# Patient Record
Sex: Male | Born: 1940
Health system: Southern US, Community
[De-identification: ages and names within clinical notes are randomized; demographics above are authoritative.]

## PROBLEM LIST (undated history)

## (undated) DIAGNOSIS — I7121 Aneurysm of the ascending aorta, without rupture: Secondary | ICD-10-CM

## (undated) DIAGNOSIS — Z8249 Family history of ischemic heart disease and other diseases of the circulatory system: Secondary | ICD-10-CM

## (undated) DIAGNOSIS — I2510 Atherosclerotic heart disease of native coronary artery without angina pectoris: Secondary | ICD-10-CM

## (undated) DIAGNOSIS — C801 Malignant (primary) neoplasm, unspecified: Secondary | ICD-10-CM

## (undated) DIAGNOSIS — E785 Hyperlipidemia, unspecified: Secondary | ICD-10-CM

## (undated) DIAGNOSIS — E041 Nontoxic single thyroid nodule: Secondary | ICD-10-CM

## (undated) DIAGNOSIS — I712 Thoracic aortic aneurysm, without rupture: Secondary | ICD-10-CM

## (undated) DIAGNOSIS — I10 Essential (primary) hypertension: Secondary | ICD-10-CM

## (undated) HISTORY — DX: Aneurysm of the ascending aorta, without rupture: I71.21

## (undated) HISTORY — PX: URINARY SPHINCTER IMPLANT: SHX2624

## (undated) HISTORY — DX: Malignant (primary) neoplasm, unspecified: C80.1

## (undated) HISTORY — DX: Hyperlipidemia, unspecified: E78.5

## (undated) HISTORY — DX: Thoracic aortic aneurysm, without rupture: I71.2

## (undated) HISTORY — DX: Essential (primary) hypertension: I10

## (undated) HISTORY — DX: Nontoxic single thyroid nodule: E04.1

## (undated) HISTORY — PX: HAND SURGERY: SHX662

## (undated) HISTORY — PX: ROTATOR CUFF REPAIR: SHX139

## (undated) HISTORY — PX: CARDIAC CATHETERIZATION: SHX172

## (undated) HISTORY — PX: CARPAL TUNNEL RELEASE: SHX101

## (undated) HISTORY — DX: Family history of ischemic heart disease and other diseases of the circulatory system: Z82.49

---
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
## 1980-12-03 HISTORY — PX: INGUINAL HERNIA REPAIR: SUR1180

## 1993-12-03 DIAGNOSIS — C801 Malignant (primary) neoplasm, unspecified: Secondary | ICD-10-CM

## 1993-12-03 HISTORY — DX: Malignant (primary) neoplasm, unspecified: C80.1

## 2015-02-09 DIAGNOSIS — E780 Pure hypercholesterolemia: Secondary | ICD-10-CM | POA: Diagnosis not present

## 2015-02-09 DIAGNOSIS — C61 Malignant neoplasm of prostate: Secondary | ICD-10-CM | POA: Diagnosis not present

## 2015-03-01 DIAGNOSIS — I6523 Occlusion and stenosis of bilateral carotid arteries: Secondary | ICD-10-CM | POA: Diagnosis not present

## 2015-03-01 DIAGNOSIS — E780 Pure hypercholesterolemia: Secondary | ICD-10-CM | POA: Diagnosis not present

## 2015-03-01 DIAGNOSIS — I119 Hypertensive heart disease without heart failure: Secondary | ICD-10-CM | POA: Diagnosis not present

## 2015-03-02 DIAGNOSIS — M65351 Trigger finger, right little finger: Secondary | ICD-10-CM | POA: Diagnosis not present

## 2015-03-02 DIAGNOSIS — M65341 Trigger finger, right ring finger: Secondary | ICD-10-CM | POA: Diagnosis not present

## 2015-03-02 DIAGNOSIS — M65841 Other synovitis and tenosynovitis, right hand: Secondary | ICD-10-CM | POA: Diagnosis not present

## 2015-03-14 DIAGNOSIS — R1031 Right lower quadrant pain: Secondary | ICD-10-CM | POA: Diagnosis not present

## 2015-03-14 DIAGNOSIS — N529 Male erectile dysfunction, unspecified: Secondary | ICD-10-CM | POA: Diagnosis not present

## 2015-03-14 DIAGNOSIS — C61 Malignant neoplasm of prostate: Secondary | ICD-10-CM | POA: Diagnosis not present

## 2015-03-14 DIAGNOSIS — N3941 Urge incontinence: Secondary | ICD-10-CM | POA: Diagnosis not present

## 2015-04-18 DIAGNOSIS — N3941 Urge incontinence: Secondary | ICD-10-CM | POA: Diagnosis not present

## 2015-04-18 DIAGNOSIS — N529 Male erectile dysfunction, unspecified: Secondary | ICD-10-CM | POA: Diagnosis not present

## 2015-04-18 DIAGNOSIS — C61 Malignant neoplasm of prostate: Secondary | ICD-10-CM | POA: Diagnosis not present

## 2015-04-18 DIAGNOSIS — R1030 Lower abdominal pain, unspecified: Secondary | ICD-10-CM | POA: Diagnosis not present

## 2015-05-16 DIAGNOSIS — E040 Nontoxic diffuse goiter: Secondary | ICD-10-CM | POA: Diagnosis not present

## 2015-05-20 DIAGNOSIS — E040 Nontoxic diffuse goiter: Secondary | ICD-10-CM | POA: Diagnosis not present

## 2015-05-20 DIAGNOSIS — E042 Nontoxic multinodular goiter: Secondary | ICD-10-CM | POA: Diagnosis not present

## 2015-08-02 DIAGNOSIS — H01002 Unspecified blepharitis right lower eyelid: Secondary | ICD-10-CM | POA: Diagnosis not present

## 2015-08-02 DIAGNOSIS — H01005 Unspecified blepharitis left lower eyelid: Secondary | ICD-10-CM | POA: Diagnosis not present

## 2015-08-04 DIAGNOSIS — R7309 Other abnormal glucose: Secondary | ICD-10-CM | POA: Diagnosis not present

## 2015-08-04 DIAGNOSIS — E780 Pure hypercholesterolemia: Secondary | ICD-10-CM | POA: Diagnosis not present

## 2015-08-04 DIAGNOSIS — C61 Malignant neoplasm of prostate: Secondary | ICD-10-CM | POA: Diagnosis not present

## 2015-08-23 DIAGNOSIS — Z23 Encounter for immunization: Secondary | ICD-10-CM | POA: Diagnosis not present

## 2015-08-30 DIAGNOSIS — I6523 Occlusion and stenosis of bilateral carotid arteries: Secondary | ICD-10-CM | POA: Diagnosis not present

## 2015-08-30 DIAGNOSIS — I119 Hypertensive heart disease without heart failure: Secondary | ICD-10-CM | POA: Diagnosis not present

## 2015-08-30 DIAGNOSIS — I340 Nonrheumatic mitral (valve) insufficiency: Secondary | ICD-10-CM | POA: Diagnosis not present

## 2015-08-30 DIAGNOSIS — E780 Pure hypercholesterolemia: Secondary | ICD-10-CM | POA: Diagnosis not present

## 2015-08-31 DIAGNOSIS — N5231 Erectile dysfunction following radical prostatectomy: Secondary | ICD-10-CM | POA: Diagnosis not present

## 2015-08-31 DIAGNOSIS — N3941 Urge incontinence: Secondary | ICD-10-CM | POA: Diagnosis not present

## 2015-08-31 DIAGNOSIS — C61 Malignant neoplasm of prostate: Secondary | ICD-10-CM | POA: Diagnosis not present

## 2015-11-21 DIAGNOSIS — M20011 Mallet finger of right finger(s): Secondary | ICD-10-CM | POA: Diagnosis not present

## 2015-11-21 DIAGNOSIS — M7712 Lateral epicondylitis, left elbow: Secondary | ICD-10-CM | POA: Diagnosis not present

## 2015-11-21 DIAGNOSIS — M2570 Osteophyte, unspecified joint: Secondary | ICD-10-CM | POA: Diagnosis not present

## 2015-11-21 DIAGNOSIS — S62636A Displaced fracture of distal phalanx of right little finger, initial encounter for closed fracture: Secondary | ICD-10-CM | POA: Diagnosis not present

## 2015-11-23 DIAGNOSIS — E040 Nontoxic diffuse goiter: Secondary | ICD-10-CM | POA: Diagnosis not present

## 2015-12-12 DIAGNOSIS — M2570 Osteophyte, unspecified joint: Secondary | ICD-10-CM | POA: Diagnosis not present

## 2015-12-12 DIAGNOSIS — S62636D Displaced fracture of distal phalanx of right little finger, subsequent encounter for fracture with routine healing: Secondary | ICD-10-CM | POA: Diagnosis not present

## 2015-12-12 DIAGNOSIS — M20011 Mallet finger of right finger(s): Secondary | ICD-10-CM | POA: Diagnosis not present

## 2015-12-12 DIAGNOSIS — M7712 Lateral epicondylitis, left elbow: Secondary | ICD-10-CM | POA: Diagnosis not present

## 2015-12-13 DIAGNOSIS — E040 Nontoxic diffuse goiter: Secondary | ICD-10-CM | POA: Diagnosis not present

## 2016-01-09 DIAGNOSIS — S62636D Displaced fracture of distal phalanx of right little finger, subsequent encounter for fracture with routine healing: Secondary | ICD-10-CM | POA: Diagnosis not present

## 2016-01-09 DIAGNOSIS — M20011 Mallet finger of right finger(s): Secondary | ICD-10-CM | POA: Diagnosis not present

## 2016-01-09 DIAGNOSIS — M2570 Osteophyte, unspecified joint: Secondary | ICD-10-CM | POA: Diagnosis not present

## 2016-01-09 DIAGNOSIS — M7712 Lateral epicondylitis, left elbow: Secondary | ICD-10-CM | POA: Diagnosis not present

## 2016-02-06 DIAGNOSIS — M2570 Osteophyte, unspecified joint: Secondary | ICD-10-CM | POA: Diagnosis not present

## 2016-02-06 DIAGNOSIS — M7712 Lateral epicondylitis, left elbow: Secondary | ICD-10-CM | POA: Diagnosis not present

## 2016-02-06 DIAGNOSIS — M20011 Mallet finger of right finger(s): Secondary | ICD-10-CM | POA: Diagnosis not present

## 2016-02-06 DIAGNOSIS — S62636D Displaced fracture of distal phalanx of right little finger, subsequent encounter for fracture with routine healing: Secondary | ICD-10-CM | POA: Diagnosis not present

## 2016-02-13 DIAGNOSIS — H109 Unspecified conjunctivitis: Secondary | ICD-10-CM | POA: Diagnosis not present

## 2016-02-13 DIAGNOSIS — J069 Acute upper respiratory infection, unspecified: Secondary | ICD-10-CM | POA: Diagnosis not present

## 2016-02-14 DIAGNOSIS — H109 Unspecified conjunctivitis: Secondary | ICD-10-CM | POA: Diagnosis not present

## 2016-02-14 DIAGNOSIS — J069 Acute upper respiratory infection, unspecified: Secondary | ICD-10-CM | POA: Diagnosis not present

## 2016-02-20 DIAGNOSIS — R7989 Other specified abnormal findings of blood chemistry: Secondary | ICD-10-CM | POA: Diagnosis not present

## 2016-02-20 DIAGNOSIS — E785 Hyperlipidemia, unspecified: Secondary | ICD-10-CM | POA: Diagnosis not present

## 2016-02-20 DIAGNOSIS — R7309 Other abnormal glucose: Secondary | ICD-10-CM | POA: Diagnosis not present

## 2016-02-20 DIAGNOSIS — D649 Anemia, unspecified: Secondary | ICD-10-CM | POA: Diagnosis not present

## 2016-02-20 DIAGNOSIS — E7800 Pure hypercholesterolemia, unspecified: Secondary | ICD-10-CM | POA: Diagnosis not present

## 2016-02-20 DIAGNOSIS — E119 Type 2 diabetes mellitus without complications: Secondary | ICD-10-CM | POA: Diagnosis not present

## 2016-02-20 DIAGNOSIS — R748 Abnormal levels of other serum enzymes: Secondary | ICD-10-CM | POA: Diagnosis not present

## 2016-02-20 DIAGNOSIS — C61 Malignant neoplasm of prostate: Secondary | ICD-10-CM | POA: Diagnosis not present

## 2016-02-28 ENCOUNTER — Ambulatory Visit: Payer: Medicare Other | Attending: Orthopedic Surgery

## 2016-02-28 DIAGNOSIS — M6281 Muscle weakness (generalized): Secondary | ICD-10-CM | POA: Diagnosis not present

## 2016-02-28 DIAGNOSIS — M25522 Pain in left elbow: Secondary | ICD-10-CM | POA: Insufficient documentation

## 2016-02-28 NOTE — Patient Instructions (Signed)
AROM: Elbow Flexion / Extension   With left hand palm up, gently bend elbow as far as possible.  Hold 10 seconds Then straighten arm as far as possible. Repeat _5__ times per set. Do __3__ sets per session. Do __3__ sessions per day.  Copyright  VHI. All rights reserved.  Wrist Flexor Stretch   Keeping elbow straight, grasp left hand and slowly bend wrist back until stretch is felt. Hold __10-20__ seconds. Relax. Repeat _5___ times per set. Do _1___ sets per session. Do __3__ sessions per day.  Copyright  VHI. All rights reserved.  Wrist Extensor Stretch   Keeping elbow straight, grasp left hand and slowly bend wrist forward until stretch is felt. Hold __10-20__ seconds. Relax. Repeat __5__ times per set. Do _1___ sets per session. Do __3__ sessions per day.  Copyright  VHI. All rights reserved.   [facility] [address], [place], [place] [address] Phone # [phone] Fax [phone]

## 2016-02-28 NOTE — Therapy (Signed)
[facility] [address] [place], [place], [address] Phone: [phone]   Fax:  [phone]  Physical Therapy Evaluation  Patient Details  Name: Miguel [place] MRN: [id number] Date of Birth: 1941/11/06 Referring Provider: Dyann Kief, MD  Encounter Date: 02/28/2016      PT End of Session - 02/28/16 0923    Visit Number 1   Number of Visits 10   Date for PT Re-Evaluation 04/24/16   PT Start Time [id number]   PT Stop Time 0930   PT Time Calculation (min) 43 min   Activity Tolerance Patient tolerated treatment well   Behavior During Therapy [facility] for tasks assessed/performed      Past Medical History  Diagnosis Date  . Cancer ([place]) 1995    History reviewed. No pertinent past surgical history.  There were no vitals filed for this visit.  Visit Diagnosis:  Pain in left elbow - Plan: PT plan of care cert/re-cert  Muscle weakness (generalized) - Plan: PT plan of care cert/re-cert      Subjective Assessment - 02/28/16 0849    Subjective Pt is a Rt hand dominant male who presents to PT with Lt elbow pain that began 10/2015.  Pt reports that he hit his elbow on the bathroom door around that time and this might have triggered his pain.  Pt has had 2 injections into the Lt elbow.     Pertinent History 2 injections into Lt elbow- last one was 01/2016   Diagnostic tests x-ray: negative   Patient Stated Goals reduce Lt elbow pain with use, reduce to using Lt UE with weight training at the gym   Currently in Pain? Yes   Pain Score 2    Pain Location Elbow   Pain Orientation Left   Pain Descriptors / Indicators Sore   Pain Type Chronic pain   Pain Onset More than a month ago   Pain Frequency Intermittent   Aggravating Factors  lifting with Lt UE   Pain Relieving Factors not lifting with Lt UE            [facility] PT Assessment - 02/28/16 0001    Assessment   Medical Diagnosis Lt lateral epicondylitis   Referring  Provider Dyann Kief, MD   Onset Date/Surgical Date 10/31/15   Hand Dominance Right   Prior Therapy none   Precautions   Precautions Other (comment)  history of cancer, no [place]   Restrictions   Weight Bearing Restrictions No   Balance Screen   Has the patient fallen in the past 6 months No   Has the patient had a decrease in activity level because of a fear of falling?  No   Is the patient reluctant to leave their home because of a fear of falling?  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  Pain over Lt lateral epicondyle with end range wrist flexion and elbow flexion   PROM   Overall PROM  Within functional limits for tasks performed   Strength   Overall Strength Within functional limits for tasks performed   Overall Strength Comments Lt elbow 4/5, wrist 4+/5 wth pain upon Lt wrist extension    Palpation   Palpation comment Pt with localized palpable tenderness over Lt lateral epicondyle and proximal wrist extensor tendons.  No palpable tenderness over distal triceps.                     [facility] Adult PT Treatment/Exercise - 02/28/16 0001    Modalities   Modalities Electrical Stimulation;Cryotherapy   Cryotherapy   Number Minutes Cryotherapy 10 Minutes   Cryotherapy Location Forearm   Type of  Cryotherapy Ice pack   Electrical Stimulation   Electrical Stimulation Location Lt lateral epicondyle and wrist extensor tendons   Electrical Stimulation Action Premod- 1 channel   Electrical Stimulation Parameters 15 minutes   Electrical Stimulation Goals Pain                PT Education - 02/28/16 0912    Education provided Yes   Education Details HEP: wrist and elbow stretches   Person(s) Educated Patient   Methods Explanation;Demonstration;Handout   Comprehension Verbalized understanding;Returned demonstration          PT Short Term Goals - 02/28/16 0928    PT SHORT TERM GOAL #1   Title be independent in initial HEP   Time 4   Period Weeks   Status New   PT SHORT TERM GOAL #2   Title report a 50% reduction in Lt elbow pain with lifting tasks   Time 4   Period Weeks   Status New           PT Long Term Goals - 02/28/16 0845    PT LONG TERM GOAL #1   Title be independent in advanced HEP   Time 8   Period Weeks   Status New   PT LONG TERM GOAL #2   Title reduce FOTO to < or = to 31% limitation   Time 8   Period Weeks   Status New   PT LONG TERM GOAL #3   Title report a 75% reduction in Lt elbow pain with lifting tasks   Time 8   Period Weeks   Status New   PT LONG TERM GOAL #4   Title return to regular weight lifting with Lt UE without limitaiton   Time 8   Period Weeks   Status New   PT LONG TERM GOAL #5   Title demonstrate 5/5 Lt elbow and wrist strength to improve endurance with use   Time 8   Period Weeks   Status New               Plan - 02/28/16 [id number]    Clinical Impression Statement Pt is a Rt hand dominant male who presents to PT with Lt elbow pain at the lateral epicondyle.  Pt has had 2 injections and has had relief after each injection.  Pt demonstrates weakness in the Lt wrist and elbow with testing and palpable tenderness over Lt lateral epicondyle and active trigger points over Lt proximial wrist extensor tendons.  Pt will  benefit from skilled PT for flexibility, manual, dry needling and slow progression of strength exercises to reduced pain and allow for return to  weight lifting with Lt UE.   Pt will benefit from skilled therapeutic intervention in order to improve on the following deficits Pain;Decreased strength;Decreased activity tolerance;Increased muscle spasms   Rehab Potential Good   PT Frequency 2x / week   PT Duration 8 weeks   PT Treatment/Interventions ADLs/Self Care Home Management;Iontophoresis 4mg /ml Dexamethasone;Electrical Stimulation;Moist Heat;Therapeutic exercise;Therapeutic activities;Functional mobility training;Neuromuscular re-education;Patient/family education;Manual techniques;Dry needling;Passive range of motion   PT Next Visit Plan Dry needling when treated by Stacy/Seanpatrick Maisano, soft tissue to Lt wrist extensor tendons, e-stim, flexibility   Consulted and Agree with Plan of Care Patient          G-Codes - 03-15-2016 0845    Functional Assessment Tool Used FOTO: 42% limitation   Functional Limitation Other PT primary   Other PT Primary Current Status [id number]) At least 40 percent but less than 60 percent impaired, limited or restricted   Other PT Primary Goal Status [id number]) At least 20 percent but less than 40 percent impaired, limited or restricted       Problem List There are no active problems to display for this patient.   Sigurd Sos, PT Mar 15, 2016 9:37 AM  [facility] [address] [place], [place], [address] Phone: [phone]   Fax:  [phone]  Name: Miguel [place] MRN: [id number] Date of Birth: 07-22-1941

## 2016-03-01 ENCOUNTER — Ambulatory Visit: Payer: Medicare Other | Admitting: Physical Therapy

## 2016-03-01 DIAGNOSIS — J069 Acute upper respiratory infection, unspecified: Secondary | ICD-10-CM | POA: Diagnosis not present

## 2016-03-01 DIAGNOSIS — R05 Cough: Secondary | ICD-10-CM | POA: Diagnosis not present

## 2016-03-01 DIAGNOSIS — M25522 Pain in left elbow: Secondary | ICD-10-CM

## 2016-03-01 DIAGNOSIS — M6281 Muscle weakness (generalized): Secondary | ICD-10-CM | POA: Diagnosis not present

## 2016-03-01 DIAGNOSIS — H109 Unspecified conjunctivitis: Secondary | ICD-10-CM | POA: Diagnosis not present

## 2016-03-01 NOTE — Patient Instructions (Signed)
     Trigger Point Dry Needling  . What is Trigger Point Dry Needling (DN)? o DN is a physical therapy technique used to treat muscle pain and dysfunction. Specifically, DN helps deactivate muscle trigger points (muscle knots).  o A thin filiform needle is used to penetrate the skin and stimulate the underlying trigger point. The goal is for a local twitch response (LTR) to occur and for the trigger point to relax. No medication of any kind is injected during the procedure.   . What Does Trigger Point Dry Needling Feel Like?  o The procedure feels different for each individual patient. Some patients report that they do not actually feel the needle enter the skin and overall the process is not painful. Very mild bleeding may occur. However, many patients feel a deep cramping in the muscle in which the needle was inserted. This is the local twitch response.   . How Will I feel after the treatment? o Soreness is normal, and the onset of soreness may not occur for a few hours. Typically this soreness does not last longer than two days.  o Bruising is uncommon, however; ice can be used to decrease any possible bruising.  o In rare cases feeling tired or nauseous after the treatment is normal. In addition, your symptoms may get worse before they get better, this period will typically not last longer than 24 hours.   . What Can I do After My Treatment? o Increase your hydration by drinking more water for the next 24 hours. o You may place ice or heat on the areas treated that have become sore, however, do not use heat on inflamed or bruised areas. Heat often brings more relief post needling. o You can continue your regular activities, but vigorous activity is not recommended initially after the treatment for 24 hours. o DN is best combined with other physical therapy such as strengthening, stretching, and other therapies.    Mikaya Bunner PT [facility] [address], [address] [place], [place] [address] Phone # [phone] Fax [phone] 

## 2016-03-01 NOTE — Therapy (Signed)
[facility] [address], [place], [place], [address] Phone: [phone]   Fax:  [phone]  Physical Therapy Treatment  Patient Details  Name: Miguel [place] MRN: [id number] Date of Birth: 05-Jul-1941 Referring Provider: Dyann Kief, MD  Encounter Date: 03/01/2016      PT End of Session - 03/01/16 1051    Visit Number 2   Number of Visits 10   Date for PT Re-Evaluation 04/24/16   PT Start Time 1010   PT Stop Time 1058   PT Time Calculation (min) 48 min   Activity Tolerance Patient tolerated treatment well      Past Medical History  Diagnosis Date  . Cancer ([place]) 1995    No past surgical history on file.  There were no vitals filed for this visit.  Visit Diagnosis:  Pain in left elbow  Muscle weakness (generalized)      Subjective Assessment - 03/01/16 1010    Subjective Painful with carrying bags on left.  Avoiding at the gym weight with left.  C/o allergies;  e-stim helped a lot last time.     Currently in Pain? Yes   Pain Score 0-No pain  pronate and lift pain 2-3/10, straight arm   Pain Orientation Left   Pain Type Chronic pain   Pain Frequency Intermittent                         [facility] Adult PT Treatment/Exercise - 03/01/16 0001    Electrical Stimulation   Electrical Stimulation Location Lt lateral epicondyle and wrist extensor tendons   Electrical Stimulation Action pre-mob   Electrical Stimulation Parameters 15 min 5 ma   Electrical Stimulation Goals Pain   Manual Therapy   Manual Therapy Soft tissue mobilization;Other (comment)   Soft tissue mobilization ECRB and pronators manually   Other Manual Therapy mob with movement 3x 10 with wrist extension          Trigger Point Dry Needling - 03/01/16 1048    Consent Given? Yes   Education Handout Provided Yes   Muscles Treated Upper Body --  left ECRB, pronation               PT Education - 03/01/16  1046    Education provided Yes   Education Details dry needling after care;  HEP and gym ex progression   Person(s) Educated Patient   Methods Explanation;Handout   Comprehension Verbalized understanding          PT Short Term Goals - 03/01/16 1057    PT SHORT TERM GOAL #1   Title be independent in initial HEP   Time 4   Period Weeks   Status On-going   PT SHORT TERM GOAL #2   Title report a 50% reduction in Lt elbow pain with lifting tasks   Time 4   Period Weeks   Status On-going           PT Long Term Goals - 03/01/16 1057    PT LONG TERM GOAL #1   Title be independent in advanced HEP   Time 8   Period Weeks   Status On-going   PT LONG TERM GOAL #2   Title reduce FOTO to < or = to 31% limitation   Time 8   Period Weeks   Status On-going   PT LONG TERM GOAL #3   Title report a 75% reduction in Lt elbow pain with lifting tasks  Time 8   Period Weeks   Status On-going   PT LONG TERM GOAL #4   Title return to regular weight lifting with Lt UE without limitaiton   Time 8   Period Weeks   Status On-going   PT LONG TERM GOAL #5   Title demonstrate 5/5 Lt elbow and wrist strength to improve endurance with use   Time 8   Period Weeks   Status On-going               Plan - 03/01/16 1052    Clinical Impression Statement Multiple tender points in left extensor carpi radialis brevis and supinator.  Twitch responses produced (good prognostic indicator).  Therapist closely monitoring response with all interventions.    PT Next Visit Plan assess response to DN #1;  manual therapy;  e-stim; ex progression;          Problem List There are no active problems to display for this patient.   [name] 03/01/2016, 11:25 AM  [facility] [address] [place], [place], [address] Phone: [phone]   Fax:  [phone]  Name: Miguel [place] MRN: [id number] Date of Birth:  [birth year]    Ruben Im, PT 03/01/2016 11:26 AM Phone: [phone] Fax: [phone]

## 2016-03-05 DIAGNOSIS — I119 Hypertensive heart disease without heart failure: Secondary | ICD-10-CM | POA: Diagnosis not present

## 2016-03-05 DIAGNOSIS — I712 Thoracic aortic aneurysm, without rupture: Secondary | ICD-10-CM | POA: Diagnosis not present

## 2016-03-05 DIAGNOSIS — E7800 Pure hypercholesterolemia, unspecified: Secondary | ICD-10-CM | POA: Diagnosis not present

## 2016-03-05 DIAGNOSIS — I340 Nonrheumatic mitral (valve) insufficiency: Secondary | ICD-10-CM | POA: Diagnosis not present

## 2016-03-07 DIAGNOSIS — C61 Malignant neoplasm of prostate: Secondary | ICD-10-CM | POA: Diagnosis not present

## 2016-03-07 DIAGNOSIS — N529 Male erectile dysfunction, unspecified: Secondary | ICD-10-CM | POA: Diagnosis not present

## 2016-03-07 DIAGNOSIS — N3941 Urge incontinence: Secondary | ICD-10-CM | POA: Diagnosis not present

## 2016-03-08 DIAGNOSIS — H6121 Impacted cerumen, right ear: Secondary | ICD-10-CM | POA: Diagnosis not present

## 2016-03-08 DIAGNOSIS — H109 Unspecified conjunctivitis: Secondary | ICD-10-CM | POA: Diagnosis not present

## 2016-03-08 DIAGNOSIS — J069 Acute upper respiratory infection, unspecified: Secondary | ICD-10-CM | POA: Diagnosis not present

## 2016-03-08 DIAGNOSIS — Z719 Counseling, unspecified: Secondary | ICD-10-CM | POA: Diagnosis not present

## 2016-03-08 DIAGNOSIS — Z23 Encounter for immunization: Secondary | ICD-10-CM | POA: Diagnosis not present

## 2016-03-23 ENCOUNTER — Ambulatory Visit: Payer: Medicare Other | Attending: Orthopedic Surgery | Admitting: Physical Therapy

## 2016-03-23 DIAGNOSIS — D229 Melanocytic nevi, unspecified: Secondary | ICD-10-CM | POA: Diagnosis not present

## 2016-03-23 DIAGNOSIS — M25522 Pain in left elbow: Secondary | ICD-10-CM

## 2016-03-23 DIAGNOSIS — R7309 Other abnormal glucose: Secondary | ICD-10-CM | POA: Diagnosis not present

## 2016-03-23 DIAGNOSIS — M6281 Muscle weakness (generalized): Secondary | ICD-10-CM | POA: Insufficient documentation

## 2016-03-23 DIAGNOSIS — Z8546 Personal history of malignant neoplasm of prostate: Secondary | ICD-10-CM | POA: Diagnosis not present

## 2016-03-23 DIAGNOSIS — H6121 Impacted cerumen, right ear: Secondary | ICD-10-CM | POA: Diagnosis not present

## 2016-03-23 DIAGNOSIS — Z23 Encounter for immunization: Secondary | ICD-10-CM | POA: Diagnosis not present

## 2016-03-23 DIAGNOSIS — E785 Hyperlipidemia, unspecified: Secondary | ICD-10-CM | POA: Diagnosis not present

## 2016-03-23 NOTE — Therapy (Signed)
[facility] [place] [address], [place], [place], [address] Phone: [phone]   Fax:  [phone]  Physical Therapy Treatment  Patient Details  Name: Miguel [place] MRN: [id number] Date of Birth: [birth year] Referring Provider: Dyann Kief, MD  Encounter Date: 03/23/2016      PT End of Session - 03/23/16 0920    Visit Number 3   Number of Visits 10   Date for PT Re-Evaluation 04/24/16   PT Start Time 0845   PT Stop Time 0930   PT Time Calculation (min) 45 min   Activity Tolerance Patient tolerated treatment well      Past Medical History  Diagnosis Date  . Cancer ([place]) 1995    No past surgical history on file.  There were no vitals filed for this visit.      Subjective Assessment - 03/23/16 0844    Subjective Been feeling good.  I'm in the gym now using the machines carefully and slowly.  The elbow is coming along.  I think the dry needling and e-stim helped last session.  I used the bicep curls and press machine without pain.                           [facility] Adult PT Treatment/Exercise - 03/23/16 0001    Moist Heat Therapy   Number Minutes Moist Heat 15 Minutes   Moist Heat Location Elbow   Electrical Stimulation   Electrical Stimulation Location Lt lateral epicondyle and wrist extensor tendons   Electrical Stimulation Action pre-mod   Electrical Stimulation Parameters 15   Electrical Stimulation Goals Pain   Manual Therapy   Soft tissue mobilization ECRB, supinator, brachioradialis          Trigger Point Dry Needling - 03/23/16 [id number]    Consent Given? Yes   Muscles Treated Upper Body --  left supinator, brachioradialis              PT Education - 03/23/16 0920    Education provided Yes   Education Details Prone shoulder Is,Ys, Ts   Person(s) Educated Patient   Methods Explanation;Demonstration;Handout   Comprehension Verbalized understanding;Returned demonstration           PT Short Term Goals - 03/23/16 0927    PT SHORT TERM GOAL #1   Title be independent in initial HEP   Status Achieved   PT SHORT TERM GOAL #2   Title report a 50% reduction in Lt elbow pain with lifting tasks   Baseline 60% 4/21   Time 4   Period Weeks   Status Achieved           PT Long Term Goals - 03/23/16 0932    PT LONG TERM GOAL #1   Title be independent in advanced HEP   Time 8   Period Weeks   Status On-going   PT LONG TERM GOAL #2   Title reduce FOTO to < or = to 31% limitation   Time 8   Period Weeks   Status On-going   PT LONG TERM GOAL #3   Title report a 75% reduction in Lt elbow pain with lifting tasks   Time 8   Period Weeks   Status On-going   PT LONG TERM GOAL #4   Title return to regular weight lifting with Lt UE without limitaiton   Time 8   Period Weeks   Status On-going   PT LONG TERM GOAL #5  Title demonstrate 5/5 Lt elbow and wrist strength to improve endurance with use   Time 8   Period Weeks   Status On-going               Plan - 03/23/16 [id number]    Clinical Impression Statement Reports he is 60% better overall.  The patient has decreased number of tender points in left elbow and forearm.  He has been able to return to the gym with some modifications.  Instructed in scapular muscle activation to decrease overuse of elbow/forearm extensors.   Progressing with goals.  Therapist closely monitoring response with all interventions.     PT Next Visit Plan assess response to DN #2;  manual therapy;  e-stim; ex progression;        Patient will benefit from skilled therapeutic intervention in order to improve the following deficits and impairments:     Visit Diagnosis: Pain in left elbow  Muscle weakness (generalized)     Problem List There are no active problems to display for this patient.   Miguel [place], PT 03/23/2016 10:18 AM Phone: [phone] Fax: [phone]  Miguel [place] 03/23/2016, 10:18  AM  [facility] [place] [address], [name], [place], [address] Phone: [phone]   Fax:  [phone]  Name: Miguel [place] MRN: [id number] Date of Birth: 23-Sep-1941

## 2016-03-23 NOTE — Patient Instructions (Signed)
     Work up to 30 reps, then add light hand weight and cut back to 15 reps again.  When you've added weights, also cut back to every other day rather than 1x/day.    Ruben Im PT [facility] [address] [place], [place] [address] Phone # [phone] Fax [phone]

## 2016-03-26 ENCOUNTER — Encounter: Payer: Medicare Other | Admitting: Physical Therapy

## 2016-03-29 ENCOUNTER — Ambulatory Visit: Payer: Medicare Other | Admitting: Physical Therapy

## 2016-03-29 DIAGNOSIS — M25522 Pain in left elbow: Secondary | ICD-10-CM

## 2016-03-29 DIAGNOSIS — M6281 Muscle weakness (generalized): Secondary | ICD-10-CM | POA: Diagnosis not present

## 2016-03-29 NOTE — Therapy (Signed)
[facility] [address], [place], [place], [address] Phone: [phone]   Fax:  [phone]  Physical Therapy Treatment  Patient Details  Name: Miguel [place] MRN: [id number] Date of Birth: 10-25-1941 Referring Provider: Dyann Kief, MD  Encounter Date: 03/29/2016      PT End of Session - 03/29/16 1007    Visit Number 4   Number of Visits 10   Date for PT Re-Evaluation 04/24/16   PT Start Time 0930   PT Stop Time 1015   PT Time Calculation (min) 45 min   Activity Tolerance Patient tolerated treatment well      Past Medical History  Diagnosis Date  . Cancer ([place]) 1995    No past surgical history on file.  There were no vitals filed for this visit.      Subjective Assessment - 03/29/16 0934    Subjective (p) Doing most of the machines in the gym without difficulty.  Pain is subsiding as long as I don't bang my elbow.  AM stiffness improves with activity.  Soreness wears away as day goes on.  Starting to pick up packages with modification of biceps instead of brachioradialis.    Decreasing swelling.     Currently in Pain? (p) No/denies   Pain Score (p) 0-No pain                         [facility] Adult PT Treatment/Exercise - 03/29/16 0001    Moist Heat Therapy   Number Minutes Moist Heat 15 Minutes   Moist Heat Location Elbow   Electrical Stimulation   Electrical Stimulation Location Lt lateral epicondyle and wrist extensor tendons   Electrical Stimulation Action pre-mod   Electrical Stimulation Parameters 15   Electrical Stimulation Goals Pain   Manual Therapy   Soft tissue mobilization ECRB, supinator, brachioradialis          Trigger Point Dry Needling - 03/29/16 1004    Consent Given? Yes   Muscles Treated Upper Body --  left supinator , ECRB              PT Education - 03/29/16 1006    Education provided Yes   Education Details discussed pec machine and  overhead triceps modifications    Person(s) Educated Patient   Methods Explanation   Comprehension Verbalized understanding          PT Short Term Goals - 03/29/16 1711    PT SHORT TERM GOAL #1   Title be independent in initial HEP   Status Achieved   PT SHORT TERM GOAL #2   Title report a 50% reduction in Lt elbow pain with lifting tasks   Status Achieved           PT Long Term Goals - 03/29/16 1711    PT LONG TERM GOAL #1   Title be independent in advanced HEP   Time 8   Period Weeks   Status On-going   PT LONG TERM GOAL #2   Title reduce FOTO to < or = to 31% limitation   Time 8   Period Weeks   Status On-going   PT LONG TERM GOAL #3   Title report a 75% reduction in Lt elbow pain with lifting tasks   Time 8   Period Weeks   Status On-going   PT LONG TERM GOAL #4   Title return to regular weight lifting with Lt UE without limitaiton  Time 8   Period Weeks   Status On-going   PT LONG TERM GOAL #5   Title demonstrate 5/5 Lt elbow and wrist strength to improve endurance with use   Time 8   Period Weeks   Status On-going               Plan - 03/29/16 1706    Clinical Impression Statement Decreased trigger point size and number.  Patient has returned to the majority of his gym ex and discussed further modifications for further return to pec machine and triceps.  Good pain relief from dry needling and manual therapy as well as e-stim/heat.   Anticipate he will achieve remaining goals in 2-3 visits.     PT Next Visit Plan assess response to DN #3;  manual therapy;  e-stim; ex progression;        Patient will benefit from skilled therapeutic intervention in order to improve the following deficits and impairments:     Visit Diagnosis: Pain in left elbow  Muscle weakness (generalized)     Problem List There are no active problems to display for this patient.   Ruben Im, PT 03/29/2016 5:13 PM Phone: [phone] Fax:  [phone]   [name] 03/29/2016, 5:12 PM  [facility] [address] [place], [place], [address] Phone: [phone]   Fax:  [phone]  Name: Miguel [place] MRN: [id number] Date of Birth: 1940-12-17

## 2016-03-30 ENCOUNTER — Encounter: Payer: Medicare Other | Admitting: Physical Therapy

## 2016-04-03 ENCOUNTER — Ambulatory Visit: Payer: Medicare Other | Attending: Orthopedic Surgery | Admitting: Physical Therapy

## 2016-04-03 DIAGNOSIS — M6281 Muscle weakness (generalized): Secondary | ICD-10-CM | POA: Insufficient documentation

## 2016-04-03 DIAGNOSIS — M25522 Pain in left elbow: Secondary | ICD-10-CM | POA: Diagnosis not present

## 2016-04-03 NOTE — Therapy (Addendum)
[facility] [address] [place], [place], [address] Phone: [phone]   Fax:  [phone]  Physical Therapy Treatment/Discharge Summary  Patient Details  Name: Miguel Walker MRN: [id number] Date of Birth: Apr 06, 1941 Referring Provider: Dyann Kief, MD  Encounter Date: 04/03/2016      PT End of Session - 04/03/16 1001    Visit Number 5   Number of Visits 10   Date for PT Re-Evaluation 04/24/16   PT Start Time 0930   PT Stop Time 6160   PT Time Calculation (min) 45 min   Activity Tolerance Patient tolerated treatment well      Past Medical History  Diagnosis Date  . Cancer ([place]) 1995    No past surgical history on file.  There were no vitals filed for this visit.      Subjective Assessment - 04/03/16 0934    Subjective Did the ex circuit even the pec press without any pain.  Only thing haven't done yet is push ups.  Did planks no problem.  I know it's there when I wake up and then it gets better.  I felt a little something with lifting but better with underhand grip.  80% way better per patient report.     Currently in Pain? No/denies   Pain Score 0-No pain   Pain Location Elbow   Pain Orientation Left            [facility] PT Assessment - 04/03/16 0001    AROM   Overall AROM Comments nearly full elbow extension, full elbow flex,sup/pronation and wrist ROM                     [facility] Adult PT Treatment/Exercise - 04/03/16 0001    Exercises   Exercises --  review of gym program, discussion of push ups, elbow ext ROM   Moist Heat Therapy   Number Minutes Moist Heat 15 Minutes   Moist Heat Location Elbow   Electrical Stimulation   Electrical Stimulation Location Lt lateral epicondyle and wrist extensor tendons   Electrical Stimulation Action pre-mod 7 ma   Electrical Stimulation Parameters 15   Electrical Stimulation Goals Pain   Manual Therapy   Soft tissue mobilization ECRB,  supinator, brachioradialis          Trigger Point Dry Needling - 04/03/16 0959    Consent Given? Yes   Muscles Treated Upper Body --  ECRB proximal and supinator              PT Education - 04/03/16 2021    Education provided Yes   Education Details review of gym program/response and discussed modifications of push ups   Person(s) Educated Patient   Methods Explanation   Comprehension Verbalized understanding          PT Short Term Goals - 04/03/16 2019    PT SHORT TERM GOAL #1   Title be independent in initial HEP   Status Achieved   PT SHORT TERM GOAL #2   Title report a 50% reduction in Lt elbow pain with lifting tasks   Status Achieved           PT Long Term Goals - 04/03/16 2019    PT LONG TERM GOAL #1   Title be independent in advanced HEP   Time 8   Period Weeks   Status On-going   PT LONG TERM GOAL #2   Title reduce FOTO to < or = to 31%  limitation   Time 8   Period Weeks   Status On-going   PT LONG TERM GOAL #3   Title report a 75% reduction in Lt elbow pain with lifting tasks   Status Achieved   PT LONG TERM GOAL #4   Title return to regular weight lifting with Lt UE without limitaiton   Status Achieved   PT LONG TERM GOAL #5   Title demonstrate 5/5 Lt elbow and wrist strength to improve endurance with use   Time 8   Period Weeks   Status On-going               Plan - 04/03/16 1001    Clinical Impression Statement The patient reports an overall improvement at 80%.  Very mild puffiness lateral epicondyle.  Decreased tender point size and number.  Good return to activity/function.  Should meet remaining goals next visit for discharge.     PT Next Visit Plan recheck FOTO; MMT, remaining goals;  discharge      Patient will benefit from skilled therapeutic intervention in order to improve the following deficits and impairments:     Visit Diagnosis: Pain in left elbow  Muscle weakness (generalized)   Gcode:  Other Primary  Goal CJ; Discharge CJ  PHYSICAL THERAPY DISCHARGE SUMMARY  Visits from Start of Care: 5  Current functional level related to goals / functional outcomes: The patient called to report he is still  doing well and saw the doctor for follow up.  He requests discharge to independent HEP.  Majority of goals met.   Remaining deficits: Minimal.   Education / Equipment: Progressive gym program Plan: Patient agrees to discharge.  Patient goals were partially met. Patient is being discharged due to being pleased with the current functional level.  ?????        Problem List There are no active problems to display for this patient.  Ruben Im, PT 04/03/2016 8:22 PM Phone: [phone] Fax: [phone] [name] 04/03/2016, Osa Craver PM  [facility] [address] [place], [place], [address] Phone: [phone]   Fax:  [phone]  Name: Faheem Ziemann MRN: [id number] Date of Birth: 1941/03/26

## 2016-04-05 ENCOUNTER — Encounter: Payer: Medicare Other | Admitting: Physical Therapy

## 2016-04-10 ENCOUNTER — Encounter: Payer: Medicare Other | Admitting: Physical Therapy

## 2016-04-23 DIAGNOSIS — N528 Other male erectile dysfunction: Secondary | ICD-10-CM | POA: Diagnosis not present

## 2016-04-23 DIAGNOSIS — C61 Malignant neoplasm of prostate: Secondary | ICD-10-CM | POA: Diagnosis not present

## 2016-04-23 DIAGNOSIS — N3941 Urge incontinence: Secondary | ICD-10-CM | POA: Diagnosis not present

## 2016-04-23 DIAGNOSIS — M2570 Osteophyte, unspecified joint: Secondary | ICD-10-CM | POA: Diagnosis not present

## 2016-04-23 DIAGNOSIS — M20011 Mallet finger of right finger(s): Secondary | ICD-10-CM | POA: Diagnosis not present

## 2016-04-23 DIAGNOSIS — M7712 Lateral epicondylitis, left elbow: Secondary | ICD-10-CM | POA: Diagnosis not present

## 2016-04-23 DIAGNOSIS — S62636D Displaced fracture of distal phalanx of right little finger, subsequent encounter for fracture with routine healing: Secondary | ICD-10-CM | POA: Diagnosis not present

## 2016-05-01 ENCOUNTER — Encounter: Payer: Medicare Other | Admitting: Physical Therapy

## 2016-05-17 DIAGNOSIS — N3941 Urge incontinence: Secondary | ICD-10-CM | POA: Diagnosis not present

## 2016-05-22 DIAGNOSIS — D235 Other benign neoplasm of skin of trunk: Secondary | ICD-10-CM | POA: Diagnosis not present

## 2016-05-22 DIAGNOSIS — D485 Neoplasm of uncertain behavior of skin: Secondary | ICD-10-CM | POA: Diagnosis not present

## 2016-05-22 DIAGNOSIS — D1801 Hemangioma of skin and subcutaneous tissue: Secondary | ICD-10-CM | POA: Diagnosis not present

## 2016-05-22 DIAGNOSIS — L219 Seborrheic dermatitis, unspecified: Secondary | ICD-10-CM | POA: Diagnosis not present

## 2016-05-22 DIAGNOSIS — B353 Tinea pedis: Secondary | ICD-10-CM | POA: Diagnosis not present

## 2016-05-22 DIAGNOSIS — L821 Other seborrheic keratosis: Secondary | ICD-10-CM | POA: Diagnosis not present

## 2016-05-30 DIAGNOSIS — H9313 Tinnitus, bilateral: Secondary | ICD-10-CM | POA: Diagnosis not present

## 2016-07-02 DIAGNOSIS — H9313 Tinnitus, bilateral: Secondary | ICD-10-CM | POA: Diagnosis not present

## 2016-07-02 DIAGNOSIS — H903 Sensorineural hearing loss, bilateral: Secondary | ICD-10-CM | POA: Diagnosis not present

## 2016-07-05 ENCOUNTER — Ambulatory Visit: Payer: Medicare Other

## 2016-07-06 DIAGNOSIS — C61 Malignant neoplasm of prostate: Secondary | ICD-10-CM | POA: Diagnosis not present

## 2016-07-06 DIAGNOSIS — N359 Urethral stricture, unspecified: Secondary | ICD-10-CM | POA: Diagnosis not present

## 2016-07-06 DIAGNOSIS — N3946 Mixed incontinence: Secondary | ICD-10-CM | POA: Diagnosis not present

## 2016-07-06 DIAGNOSIS — N529 Male erectile dysfunction, unspecified: Secondary | ICD-10-CM | POA: Diagnosis not present

## 2016-08-14 DIAGNOSIS — Z23 Encounter for immunization: Secondary | ICD-10-CM | POA: Diagnosis not present

## 2016-08-20 DIAGNOSIS — H01005 Unspecified blepharitis left lower eyelid: Secondary | ICD-10-CM | POA: Diagnosis not present

## 2016-08-20 DIAGNOSIS — H01002 Unspecified blepharitis right lower eyelid: Secondary | ICD-10-CM | POA: Diagnosis not present

## 2016-08-22 DIAGNOSIS — N393 Stress incontinence (female) (male): Secondary | ICD-10-CM | POA: Diagnosis not present

## 2016-08-24 DIAGNOSIS — R7309 Other abnormal glucose: Secondary | ICD-10-CM | POA: Diagnosis not present

## 2016-08-24 DIAGNOSIS — C61 Malignant neoplasm of prostate: Secondary | ICD-10-CM | POA: Diagnosis not present

## 2016-08-24 DIAGNOSIS — E7800 Pure hypercholesterolemia, unspecified: Secondary | ICD-10-CM | POA: Diagnosis not present

## 2016-08-24 DIAGNOSIS — I119 Hypertensive heart disease without heart failure: Secondary | ICD-10-CM | POA: Diagnosis not present

## 2016-09-04 DIAGNOSIS — D485 Neoplasm of uncertain behavior of skin: Secondary | ICD-10-CM | POA: Diagnosis not present

## 2016-09-04 DIAGNOSIS — L821 Other seborrheic keratosis: Secondary | ICD-10-CM | POA: Diagnosis not present

## 2016-09-04 DIAGNOSIS — L219 Seborrheic dermatitis, unspecified: Secondary | ICD-10-CM | POA: Diagnosis not present

## 2016-09-04 DIAGNOSIS — B353 Tinea pedis: Secondary | ICD-10-CM | POA: Diagnosis not present

## 2016-09-10 DIAGNOSIS — E7800 Pure hypercholesterolemia, unspecified: Secondary | ICD-10-CM | POA: Diagnosis not present

## 2016-09-10 DIAGNOSIS — R0602 Shortness of breath: Secondary | ICD-10-CM | POA: Diagnosis not present

## 2016-09-10 DIAGNOSIS — I119 Hypertensive heart disease without heart failure: Secondary | ICD-10-CM | POA: Diagnosis not present

## 2016-09-10 DIAGNOSIS — I712 Thoracic aortic aneurysm, without rupture: Secondary | ICD-10-CM | POA: Diagnosis not present

## 2016-10-09 DIAGNOSIS — N393 Stress incontinence (female) (male): Secondary | ICD-10-CM | POA: Diagnosis not present

## 2016-11-08 DIAGNOSIS — N393 Stress incontinence (female) (male): Secondary | ICD-10-CM | POA: Diagnosis not present

## 2016-12-27 DIAGNOSIS — E040 Nontoxic diffuse goiter: Secondary | ICD-10-CM | POA: Diagnosis not present

## 2016-12-27 DIAGNOSIS — E042 Nontoxic multinodular goiter: Secondary | ICD-10-CM | POA: Diagnosis not present

## 2017-01-08 DIAGNOSIS — E041 Nontoxic single thyroid nodule: Secondary | ICD-10-CM | POA: Diagnosis not present

## 2017-03-11 DIAGNOSIS — R7309 Other abnormal glucose: Secondary | ICD-10-CM | POA: Diagnosis not present

## 2017-03-11 DIAGNOSIS — I119 Hypertensive heart disease without heart failure: Secondary | ICD-10-CM | POA: Diagnosis not present

## 2017-03-11 DIAGNOSIS — E7800 Pure hypercholesterolemia, unspecified: Secondary | ICD-10-CM | POA: Diagnosis not present

## 2017-03-11 DIAGNOSIS — C61 Malignant neoplasm of prostate: Secondary | ICD-10-CM | POA: Diagnosis not present

## 2017-04-01 DIAGNOSIS — E7800 Pure hypercholesterolemia, unspecified: Secondary | ICD-10-CM | POA: Diagnosis not present

## 2017-04-01 DIAGNOSIS — R0602 Shortness of breath: Secondary | ICD-10-CM | POA: Diagnosis not present

## 2017-04-01 DIAGNOSIS — I340 Nonrheumatic mitral (valve) insufficiency: Secondary | ICD-10-CM | POA: Diagnosis not present

## 2017-04-01 DIAGNOSIS — N359 Urethral stricture, unspecified: Secondary | ICD-10-CM | POA: Diagnosis not present

## 2017-04-01 DIAGNOSIS — I712 Thoracic aortic aneurysm, without rupture: Secondary | ICD-10-CM | POA: Diagnosis not present

## 2017-04-01 DIAGNOSIS — C61 Malignant neoplasm of prostate: Secondary | ICD-10-CM | POA: Diagnosis not present

## 2017-04-01 DIAGNOSIS — N393 Stress incontinence (female) (male): Secondary | ICD-10-CM | POA: Diagnosis not present

## 2017-04-01 DIAGNOSIS — N529 Male erectile dysfunction, unspecified: Secondary | ICD-10-CM | POA: Diagnosis not present

## 2017-04-01 DIAGNOSIS — I119 Hypertensive heart disease without heart failure: Secondary | ICD-10-CM | POA: Diagnosis not present

## 2017-04-01 DIAGNOSIS — N3946 Mixed incontinence: Secondary | ICD-10-CM | POA: Diagnosis not present

## 2017-07-04 DIAGNOSIS — M65332 Trigger finger, left middle finger: Secondary | ICD-10-CM | POA: Diagnosis not present

## 2017-07-04 DIAGNOSIS — M654 Radial styloid tenosynovitis [de Quervain]: Secondary | ICD-10-CM | POA: Diagnosis not present

## 2017-07-04 DIAGNOSIS — M1812 Unilateral primary osteoarthritis of first carpometacarpal joint, left hand: Secondary | ICD-10-CM | POA: Diagnosis not present

## 2017-07-29 DIAGNOSIS — Z0000 Encounter for general adult medical examination without abnormal findings: Secondary | ICD-10-CM | POA: Diagnosis not present

## 2017-07-29 DIAGNOSIS — E785 Hyperlipidemia, unspecified: Secondary | ICD-10-CM | POA: Diagnosis not present

## 2017-07-29 DIAGNOSIS — H9319 Tinnitus, unspecified ear: Secondary | ICD-10-CM | POA: Diagnosis not present

## 2017-07-29 DIAGNOSIS — Z23 Encounter for immunization: Secondary | ICD-10-CM | POA: Diagnosis not present

## 2017-07-29 DIAGNOSIS — R7301 Impaired fasting glucose: Secondary | ICD-10-CM | POA: Diagnosis not present

## 2017-07-29 DIAGNOSIS — Z8546 Personal history of malignant neoplasm of prostate: Secondary | ICD-10-CM | POA: Diagnosis not present

## 2017-07-31 DIAGNOSIS — N393 Stress incontinence (female) (male): Secondary | ICD-10-CM | POA: Diagnosis not present

## 2017-08-01 DIAGNOSIS — H01005 Unspecified blepharitis left lower eyelid: Secondary | ICD-10-CM | POA: Diagnosis not present

## 2017-08-01 DIAGNOSIS — H01002 Unspecified blepharitis right lower eyelid: Secondary | ICD-10-CM | POA: Diagnosis not present

## 2017-08-01 DIAGNOSIS — H01004 Unspecified blepharitis left upper eyelid: Secondary | ICD-10-CM | POA: Diagnosis not present

## 2017-08-01 DIAGNOSIS — H01001 Unspecified blepharitis right upper eyelid: Secondary | ICD-10-CM | POA: Diagnosis not present

## 2017-08-13 DIAGNOSIS — M65332 Trigger finger, left middle finger: Secondary | ICD-10-CM | POA: Diagnosis not present

## 2017-08-13 DIAGNOSIS — M1812 Unilateral primary osteoarthritis of first carpometacarpal joint, left hand: Secondary | ICD-10-CM | POA: Diagnosis not present

## 2017-08-13 DIAGNOSIS — M654 Radial styloid tenosynovitis [de Quervain]: Secondary | ICD-10-CM | POA: Diagnosis not present

## 2017-08-22 DIAGNOSIS — Z23 Encounter for immunization: Secondary | ICD-10-CM | POA: Diagnosis not present

## 2017-08-22 DIAGNOSIS — H6120 Impacted cerumen, unspecified ear: Secondary | ICD-10-CM | POA: Diagnosis not present

## 2017-08-22 DIAGNOSIS — Z1211 Encounter for screening for malignant neoplasm of colon: Secondary | ICD-10-CM | POA: Diagnosis not present

## 2017-08-22 DIAGNOSIS — H9319 Tinnitus, unspecified ear: Secondary | ICD-10-CM | POA: Diagnosis not present

## 2017-09-04 DIAGNOSIS — N393 Stress incontinence (female) (male): Secondary | ICD-10-CM | POA: Diagnosis not present

## 2017-09-12 DIAGNOSIS — M65332 Trigger finger, left middle finger: Secondary | ICD-10-CM | POA: Diagnosis not present

## 2017-09-12 DIAGNOSIS — M654 Radial styloid tenosynovitis [de Quervain]: Secondary | ICD-10-CM | POA: Diagnosis not present

## 2017-09-12 DIAGNOSIS — M79644 Pain in right finger(s): Secondary | ICD-10-CM | POA: Diagnosis not present

## 2017-09-12 DIAGNOSIS — M1812 Unilateral primary osteoarthritis of first carpometacarpal joint, left hand: Secondary | ICD-10-CM | POA: Diagnosis not present

## 2017-09-24 DIAGNOSIS — C61 Malignant neoplasm of prostate: Secondary | ICD-10-CM | POA: Diagnosis not present

## 2017-09-24 DIAGNOSIS — E7800 Pure hypercholesterolemia, unspecified: Secondary | ICD-10-CM | POA: Diagnosis not present

## 2017-09-24 DIAGNOSIS — R7309 Other abnormal glucose: Secondary | ICD-10-CM | POA: Diagnosis not present

## 2017-10-02 DIAGNOSIS — J029 Acute pharyngitis, unspecified: Secondary | ICD-10-CM | POA: Diagnosis not present

## 2017-10-07 DIAGNOSIS — N35911 Unspecified urethral stricture, male, meatal: Secondary | ICD-10-CM | POA: Diagnosis not present

## 2017-10-07 DIAGNOSIS — N3946 Mixed incontinence: Secondary | ICD-10-CM | POA: Diagnosis not present

## 2017-10-07 DIAGNOSIS — C61 Malignant neoplasm of prostate: Secondary | ICD-10-CM | POA: Diagnosis not present

## 2017-10-07 DIAGNOSIS — R002 Palpitations: Secondary | ICD-10-CM | POA: Diagnosis not present

## 2017-10-07 DIAGNOSIS — E7800 Pure hypercholesterolemia, unspecified: Secondary | ICD-10-CM | POA: Diagnosis not present

## 2017-10-07 DIAGNOSIS — I340 Nonrheumatic mitral (valve) insufficiency: Secondary | ICD-10-CM | POA: Diagnosis not present

## 2017-10-07 DIAGNOSIS — N529 Male erectile dysfunction, unspecified: Secondary | ICD-10-CM | POA: Diagnosis not present

## 2017-10-07 DIAGNOSIS — I712 Thoracic aortic aneurysm, without rupture: Secondary | ICD-10-CM | POA: Diagnosis not present

## 2017-10-11 DIAGNOSIS — L820 Inflamed seborrheic keratosis: Secondary | ICD-10-CM | POA: Diagnosis not present

## 2017-10-11 DIAGNOSIS — D225 Melanocytic nevi of trunk: Secondary | ICD-10-CM | POA: Diagnosis not present

## 2017-10-11 DIAGNOSIS — L218 Other seborrheic dermatitis: Secondary | ICD-10-CM | POA: Diagnosis not present

## 2017-10-11 DIAGNOSIS — Z872 Personal history of diseases of the skin and subcutaneous tissue: Secondary | ICD-10-CM | POA: Diagnosis not present

## 2017-10-11 DIAGNOSIS — L814 Other melanin hyperpigmentation: Secondary | ICD-10-CM | POA: Diagnosis not present

## 2017-10-11 DIAGNOSIS — B353 Tinea pedis: Secondary | ICD-10-CM | POA: Diagnosis not present

## 2017-10-11 DIAGNOSIS — L821 Other seborrheic keratosis: Secondary | ICD-10-CM | POA: Diagnosis not present

## 2017-10-31 DIAGNOSIS — M79644 Pain in right finger(s): Secondary | ICD-10-CM | POA: Diagnosis not present

## 2017-10-31 DIAGNOSIS — M65332 Trigger finger, left middle finger: Secondary | ICD-10-CM | POA: Diagnosis not present

## 2017-10-31 DIAGNOSIS — M654 Radial styloid tenosynovitis [de Quervain]: Secondary | ICD-10-CM | POA: Diagnosis not present

## 2017-10-31 DIAGNOSIS — M1812 Unilateral primary osteoarthritis of first carpometacarpal joint, left hand: Secondary | ICD-10-CM | POA: Diagnosis not present

## 2017-11-04 DIAGNOSIS — R079 Chest pain, unspecified: Secondary | ICD-10-CM | POA: Diagnosis not present

## 2017-12-03 HISTORY — PX: URINARY SPHINCTER IMPLANT: SHX2624

## 2017-12-05 DIAGNOSIS — M79644 Pain in right finger(s): Secondary | ICD-10-CM | POA: Diagnosis not present

## 2017-12-05 DIAGNOSIS — M65332 Trigger finger, left middle finger: Secondary | ICD-10-CM | POA: Diagnosis not present

## 2017-12-05 DIAGNOSIS — M654 Radial styloid tenosynovitis [de Quervain]: Secondary | ICD-10-CM | POA: Diagnosis not present

## 2017-12-05 DIAGNOSIS — M1812 Unilateral primary osteoarthritis of first carpometacarpal joint, left hand: Secondary | ICD-10-CM | POA: Diagnosis not present

## 2017-12-23 DIAGNOSIS — R69 Illness, unspecified: Secondary | ICD-10-CM | POA: Diagnosis not present

## 2018-01-20 DIAGNOSIS — M79644 Pain in right finger(s): Secondary | ICD-10-CM | POA: Diagnosis not present

## 2018-01-20 DIAGNOSIS — M654 Radial styloid tenosynovitis [de Quervain]: Secondary | ICD-10-CM | POA: Diagnosis not present

## 2018-01-20 DIAGNOSIS — M1812 Unilateral primary osteoarthritis of first carpometacarpal joint, left hand: Secondary | ICD-10-CM | POA: Diagnosis not present

## 2018-01-20 DIAGNOSIS — M65332 Trigger finger, left middle finger: Secondary | ICD-10-CM | POA: Diagnosis not present

## 2018-01-21 DIAGNOSIS — R05 Cough: Secondary | ICD-10-CM | POA: Diagnosis not present

## 2018-01-21 DIAGNOSIS — J069 Acute upper respiratory infection, unspecified: Secondary | ICD-10-CM | POA: Diagnosis not present

## 2018-01-21 DIAGNOSIS — R52 Pain, unspecified: Secondary | ICD-10-CM | POA: Diagnosis not present

## 2018-02-18 DIAGNOSIS — M654 Radial styloid tenosynovitis [de Quervain]: Secondary | ICD-10-CM | POA: Diagnosis not present

## 2018-03-13 DIAGNOSIS — C61 Malignant neoplasm of prostate: Secondary | ICD-10-CM | POA: Diagnosis not present

## 2018-03-13 DIAGNOSIS — E7800 Pure hypercholesterolemia, unspecified: Secondary | ICD-10-CM | POA: Diagnosis not present

## 2018-03-13 DIAGNOSIS — R7309 Other abnormal glucose: Secondary | ICD-10-CM | POA: Diagnosis not present

## 2018-03-13 DIAGNOSIS — I2510 Atherosclerotic heart disease of native coronary artery without angina pectoris: Secondary | ICD-10-CM | POA: Diagnosis not present

## 2018-03-13 DIAGNOSIS — E119 Type 2 diabetes mellitus without complications: Secondary | ICD-10-CM | POA: Diagnosis not present

## 2018-03-14 DIAGNOSIS — Z6828 Body mass index (BMI) 28.0-28.9, adult: Secondary | ICD-10-CM | POA: Diagnosis not present

## 2018-03-14 DIAGNOSIS — Z7982 Long term (current) use of aspirin: Secondary | ICD-10-CM | POA: Diagnosis not present

## 2018-03-14 DIAGNOSIS — R3915 Urgency of urination: Secondary | ICD-10-CM | POA: Diagnosis not present

## 2018-03-14 DIAGNOSIS — Z8249 Family history of ischemic heart disease and other diseases of the circulatory system: Secondary | ICD-10-CM | POA: Diagnosis not present

## 2018-03-14 DIAGNOSIS — E785 Hyperlipidemia, unspecified: Secondary | ICD-10-CM | POA: Diagnosis not present

## 2018-03-14 DIAGNOSIS — R351 Nocturia: Secondary | ICD-10-CM | POA: Diagnosis not present

## 2018-03-14 DIAGNOSIS — Z800 Family history of malignant neoplasm of digestive organs: Secondary | ICD-10-CM | POA: Diagnosis not present

## 2018-03-14 DIAGNOSIS — E663 Overweight: Secondary | ICD-10-CM | POA: Diagnosis not present

## 2018-03-14 DIAGNOSIS — R32 Unspecified urinary incontinence: Secondary | ICD-10-CM | POA: Diagnosis not present

## 2018-03-14 DIAGNOSIS — Z823 Family history of stroke: Secondary | ICD-10-CM | POA: Diagnosis not present

## 2018-03-18 DIAGNOSIS — I6523 Occlusion and stenosis of bilateral carotid arteries: Secondary | ICD-10-CM | POA: Diagnosis not present

## 2018-03-18 DIAGNOSIS — I10 Essential (primary) hypertension: Secondary | ICD-10-CM | POA: Diagnosis not present

## 2018-03-18 DIAGNOSIS — E7800 Pure hypercholesterolemia, unspecified: Secondary | ICD-10-CM | POA: Diagnosis not present

## 2018-03-18 DIAGNOSIS — I361 Nonrheumatic tricuspid (valve) insufficiency: Secondary | ICD-10-CM | POA: Diagnosis not present

## 2018-03-18 DIAGNOSIS — I712 Thoracic aortic aneurysm, without rupture: Secondary | ICD-10-CM | POA: Diagnosis not present

## 2018-03-18 DIAGNOSIS — R0602 Shortness of breath: Secondary | ICD-10-CM | POA: Diagnosis not present

## 2018-03-19 DIAGNOSIS — N529 Male erectile dysfunction, unspecified: Secondary | ICD-10-CM | POA: Diagnosis not present

## 2018-03-19 DIAGNOSIS — C61 Malignant neoplasm of prostate: Secondary | ICD-10-CM | POA: Diagnosis not present

## 2018-03-19 DIAGNOSIS — N3946 Mixed incontinence: Secondary | ICD-10-CM | POA: Diagnosis not present

## 2018-03-19 DIAGNOSIS — N35911 Unspecified urethral stricture, male, meatal: Secondary | ICD-10-CM | POA: Diagnosis not present

## 2018-03-28 DIAGNOSIS — M654 Radial styloid tenosynovitis [de Quervain]: Secondary | ICD-10-CM | POA: Diagnosis not present

## 2018-04-30 DIAGNOSIS — Z923 Personal history of irradiation: Secondary | ICD-10-CM | POA: Diagnosis not present

## 2018-04-30 DIAGNOSIS — N3945 Continuous leakage: Secondary | ICD-10-CM | POA: Diagnosis not present

## 2018-04-30 DIAGNOSIS — Z9079 Acquired absence of other genital organ(s): Secondary | ICD-10-CM | POA: Diagnosis not present

## 2018-04-30 DIAGNOSIS — R32 Unspecified urinary incontinence: Secondary | ICD-10-CM | POA: Diagnosis not present

## 2018-05-06 DIAGNOSIS — E7849 Other hyperlipidemia: Secondary | ICD-10-CM | POA: Diagnosis not present

## 2018-05-06 DIAGNOSIS — Z8546 Personal history of malignant neoplasm of prostate: Secondary | ICD-10-CM | POA: Diagnosis not present

## 2018-05-06 DIAGNOSIS — R32 Unspecified urinary incontinence: Secondary | ICD-10-CM | POA: Diagnosis not present

## 2018-05-06 DIAGNOSIS — Z01818 Encounter for other preprocedural examination: Secondary | ICD-10-CM | POA: Diagnosis not present

## 2018-05-12 DIAGNOSIS — Z9079 Acquired absence of other genital organ(s): Secondary | ICD-10-CM | POA: Diagnosis not present

## 2018-05-12 DIAGNOSIS — Z8546 Personal history of malignant neoplasm of prostate: Secondary | ICD-10-CM | POA: Diagnosis not present

## 2018-05-12 DIAGNOSIS — E785 Hyperlipidemia, unspecified: Secondary | ICD-10-CM | POA: Diagnosis not present

## 2018-05-12 DIAGNOSIS — N393 Stress incontinence (female) (male): Secondary | ICD-10-CM | POA: Diagnosis not present

## 2018-05-27 DIAGNOSIS — H6120 Impacted cerumen, unspecified ear: Secondary | ICD-10-CM | POA: Diagnosis not present

## 2018-06-23 DIAGNOSIS — L218 Other seborrheic dermatitis: Secondary | ICD-10-CM | POA: Diagnosis not present

## 2018-06-24 DIAGNOSIS — N393 Stress incontinence (female) (male): Secondary | ICD-10-CM | POA: Diagnosis not present

## 2018-08-07 DIAGNOSIS — H9319 Tinnitus, unspecified ear: Secondary | ICD-10-CM | POA: Diagnosis not present

## 2018-08-07 DIAGNOSIS — E785 Hyperlipidemia, unspecified: Secondary | ICD-10-CM | POA: Diagnosis not present

## 2018-08-07 DIAGNOSIS — Z23 Encounter for immunization: Secondary | ICD-10-CM | POA: Diagnosis not present

## 2018-08-07 DIAGNOSIS — Z8546 Personal history of malignant neoplasm of prostate: Secondary | ICD-10-CM | POA: Diagnosis not present

## 2018-08-07 DIAGNOSIS — H9190 Unspecified hearing loss, unspecified ear: Secondary | ICD-10-CM | POA: Diagnosis not present

## 2018-08-07 DIAGNOSIS — R7309 Other abnormal glucose: Secondary | ICD-10-CM | POA: Diagnosis not present

## 2018-08-07 DIAGNOSIS — Z0000 Encounter for general adult medical examination without abnormal findings: Secondary | ICD-10-CM | POA: Diagnosis not present

## 2018-08-28 DIAGNOSIS — E7800 Pure hypercholesterolemia, unspecified: Secondary | ICD-10-CM | POA: Diagnosis not present

## 2018-08-28 DIAGNOSIS — R7309 Other abnormal glucose: Secondary | ICD-10-CM | POA: Diagnosis not present

## 2018-08-28 DIAGNOSIS — C61 Malignant neoplasm of prostate: Secondary | ICD-10-CM | POA: Diagnosis not present

## 2018-09-11 DIAGNOSIS — I6523 Occlusion and stenosis of bilateral carotid arteries: Secondary | ICD-10-CM | POA: Diagnosis not present

## 2018-09-18 DIAGNOSIS — I712 Thoracic aortic aneurysm, without rupture: Secondary | ICD-10-CM | POA: Diagnosis not present

## 2018-09-18 DIAGNOSIS — I6523 Occlusion and stenosis of bilateral carotid arteries: Secondary | ICD-10-CM | POA: Diagnosis not present

## 2018-09-18 DIAGNOSIS — E7800 Pure hypercholesterolemia, unspecified: Secondary | ICD-10-CM | POA: Diagnosis not present

## 2018-09-26 DIAGNOSIS — T148XXA Other injury of unspecified body region, initial encounter: Secondary | ICD-10-CM | POA: Diagnosis not present

## 2018-10-01 DIAGNOSIS — R067 Sneezing: Secondary | ICD-10-CM | POA: Diagnosis not present

## 2018-10-01 DIAGNOSIS — Z48816 Encounter for surgical aftercare following surgery on the genitourinary system: Secondary | ICD-10-CM | POA: Diagnosis not present

## 2018-10-01 DIAGNOSIS — R32 Unspecified urinary incontinence: Secondary | ICD-10-CM | POA: Diagnosis not present

## 2018-10-01 DIAGNOSIS — Z960 Presence of urogenital implants: Secondary | ICD-10-CM | POA: Diagnosis not present

## 2018-10-01 DIAGNOSIS — R05 Cough: Secondary | ICD-10-CM | POA: Diagnosis not present

## 2018-10-01 DIAGNOSIS — Z9079 Acquired absence of other genital organ(s): Secondary | ICD-10-CM | POA: Diagnosis not present

## 2018-10-01 DIAGNOSIS — Z8546 Personal history of malignant neoplasm of prostate: Secondary | ICD-10-CM | POA: Diagnosis not present

## 2018-10-10 DIAGNOSIS — L218 Other seborrheic dermatitis: Secondary | ICD-10-CM | POA: Diagnosis not present

## 2018-10-10 DIAGNOSIS — D1801 Hemangioma of skin and subcutaneous tissue: Secondary | ICD-10-CM | POA: Diagnosis not present

## 2018-10-10 DIAGNOSIS — L821 Other seborrheic keratosis: Secondary | ICD-10-CM | POA: Diagnosis not present

## 2018-10-10 DIAGNOSIS — Z872 Personal history of diseases of the skin and subcutaneous tissue: Secondary | ICD-10-CM | POA: Diagnosis not present

## 2018-10-10 DIAGNOSIS — L814 Other melanin hyperpigmentation: Secondary | ICD-10-CM | POA: Diagnosis not present

## 2018-10-10 DIAGNOSIS — L570 Actinic keratosis: Secondary | ICD-10-CM | POA: Diagnosis not present

## 2018-10-10 DIAGNOSIS — L718 Other rosacea: Secondary | ICD-10-CM | POA: Diagnosis not present

## 2018-10-10 DIAGNOSIS — D225 Melanocytic nevi of trunk: Secondary | ICD-10-CM | POA: Diagnosis not present

## 2018-12-05 DIAGNOSIS — L718 Other rosacea: Secondary | ICD-10-CM | POA: Diagnosis not present

## 2019-02-06 DIAGNOSIS — H5203 Hypermetropia, bilateral: Secondary | ICD-10-CM | POA: Diagnosis not present

## 2019-02-06 DIAGNOSIS — H0100B Unspecified blepharitis left eye, upper and lower eyelids: Secondary | ICD-10-CM | POA: Diagnosis not present

## 2019-02-06 DIAGNOSIS — H0100A Unspecified blepharitis right eye, upper and lower eyelids: Secondary | ICD-10-CM | POA: Diagnosis not present

## 2019-02-06 DIAGNOSIS — H2513 Age-related nuclear cataract, bilateral: Secondary | ICD-10-CM | POA: Diagnosis not present

## 2019-04-28 DIAGNOSIS — C61 Malignant neoplasm of prostate: Secondary | ICD-10-CM | POA: Diagnosis not present

## 2019-04-28 DIAGNOSIS — E7800 Pure hypercholesterolemia, unspecified: Secondary | ICD-10-CM | POA: Diagnosis not present

## 2019-05-18 DIAGNOSIS — I712 Thoracic aortic aneurysm, without rupture: Secondary | ICD-10-CM | POA: Diagnosis not present

## 2019-05-18 DIAGNOSIS — E7800 Pure hypercholesterolemia, unspecified: Secondary | ICD-10-CM | POA: Diagnosis not present

## 2019-05-18 DIAGNOSIS — I351 Nonrheumatic aortic (valve) insufficiency: Secondary | ICD-10-CM | POA: Diagnosis not present

## 2019-05-22 DIAGNOSIS — N35911 Unspecified urethral stricture, male, meatal: Secondary | ICD-10-CM | POA: Diagnosis not present

## 2019-05-22 DIAGNOSIS — N529 Male erectile dysfunction, unspecified: Secondary | ICD-10-CM | POA: Diagnosis not present

## 2019-05-22 DIAGNOSIS — N3946 Mixed incontinence: Secondary | ICD-10-CM | POA: Diagnosis not present

## 2019-05-22 DIAGNOSIS — C61 Malignant neoplasm of prostate: Secondary | ICD-10-CM | POA: Diagnosis not present

## 2019-08-03 DIAGNOSIS — Z8249 Family history of ischemic heart disease and other diseases of the circulatory system: Secondary | ICD-10-CM | POA: Diagnosis not present

## 2019-08-03 DIAGNOSIS — H9193 Unspecified hearing loss, bilateral: Secondary | ICD-10-CM | POA: Diagnosis not present

## 2019-08-03 DIAGNOSIS — R351 Nocturia: Secondary | ICD-10-CM | POA: Diagnosis not present

## 2019-08-03 DIAGNOSIS — R3915 Urgency of urination: Secondary | ICD-10-CM | POA: Diagnosis not present

## 2019-08-03 DIAGNOSIS — E663 Overweight: Secondary | ICD-10-CM | POA: Diagnosis not present

## 2019-08-03 DIAGNOSIS — Z923 Personal history of irradiation: Secondary | ICD-10-CM | POA: Diagnosis not present

## 2019-08-03 DIAGNOSIS — Z823 Family history of stroke: Secondary | ICD-10-CM | POA: Diagnosis not present

## 2019-08-03 DIAGNOSIS — E785 Hyperlipidemia, unspecified: Secondary | ICD-10-CM | POA: Diagnosis not present

## 2019-08-03 DIAGNOSIS — R350 Frequency of micturition: Secondary | ICD-10-CM | POA: Diagnosis not present

## 2019-08-03 DIAGNOSIS — Z7982 Long term (current) use of aspirin: Secondary | ICD-10-CM | POA: Diagnosis not present

## 2019-08-13 DIAGNOSIS — R7309 Other abnormal glucose: Secondary | ICD-10-CM | POA: Diagnosis not present

## 2019-08-13 DIAGNOSIS — Z23 Encounter for immunization: Secondary | ICD-10-CM | POA: Diagnosis not present

## 2019-08-13 DIAGNOSIS — E785 Hyperlipidemia, unspecified: Secondary | ICD-10-CM | POA: Diagnosis not present

## 2019-08-13 DIAGNOSIS — Z0000 Encounter for general adult medical examination without abnormal findings: Secondary | ICD-10-CM | POA: Diagnosis not present

## 2019-08-13 DIAGNOSIS — Z8546 Personal history of malignant neoplasm of prostate: Secondary | ICD-10-CM | POA: Diagnosis not present

## 2019-08-27 NOTE — Progress Notes (Addendum)
Cardiology Office Note   Date:  08/28/2019   ID:  Miguel Walker, DOB 1941-07-11, MRN [id number]  PCP:  London Pepper, MD  Cardiologist:   Minus Breeding, MD Referring:  London Pepper, MD  Chief Complaint  Patient presents with  . Abnormal Echo      History of Present Illness: Miguel Walker is a 78 y.o. male who is self referred for evaluation of an abnormal echo..  I was able to review an echo from April of last year with a very slight aortic valve gradient and an aortic He has had essentially a negative carotid Doppler.  He has been followed by a cardiologist in the [place] and has been getting routine imaging with echo and carotid Dopplers.  He is not had any cardiovascular events. The patient denies any new symptoms such as chest discomfort, neck or arm discomfort. There has been no new shortness of breath, PND or orthopnea. There have been no reported palpitations, presyncope or syncope.  He walks for exercise.  He started golfing.  He is semiretired.  Past Medical History:  Diagnosis Date  . Aneurysm, ascending aorta (HCC)   . Cancer [facility]) 1997   Prostate cancer  . Dyslipidemia   . Family history of premature CAD    NO ISCHEMIC SYMPTOMS  . Hyperlipidemia   . Hypertension   . Thyroid nodule     Past Surgical History:  Procedure Laterality Date  . CARPAL TUNNEL RELEASE    . HAND SURGERY    . ROTATOR CUFF REPAIR    . URINARY SPHINCTER IMPLANT       Current Outpatient Medications  Medication Sig Dispense Refill  . aspirin 81 MG tablet Take 81 mg by mouth daily.    Marland Kitchen atorvastatin (LIPITOR) 20 MG tablet Take 20 mg by mouth daily.    . Multiple Vitamin (MULTI-VITAMIN) tablet Take 1 tablet by mouth daily.    . Omega-3 1000 MG CAPS Take 1 g by mouth daily.     No current facility-administered medications for this visit.     Allergies:   Patient has no known allergies.    Social History:  The patient  reports that he has never smoked. He does not have any  smokeless tobacco history on file.   Family History:  The patient's family history includes CAD in his father.    ROS:  Please see the history of present illness.   Otherwise, review of systems are positive for none.   All other systems are reviewed and negative.    PHYSICAL EXAM: VS:  BP 136/60   Pulse 76   Temp 97.9 F (36.6 C) (Temporal)   Ht 5\' 11"  (1.803 m)   Wt 197 lb 6.4 oz (89.5 kg)   SpO2 97%   BMI 27.53 kg/m  , BMI Body mass index is 27.53 kg/m. GENERAL:  Well appearing HEENT:  Pupils equal round and reactive, fundi not visualized, oral mucosa unremarkable NECK:  No jugular venous distention, waveform within normal limits, carotid upstroke brisk and symmetric, no bruits, no thyromegaly LYMPHATICS:  No cervical, inguinal adenopathy LUNGS:  Clear to auscultation bilaterally BACK:  No CVA tenderness CHEST:  Unremarkable HEART:  PMI not displaced or sustained,S1 and S2 within normal limits, no S3, no S4, no clicks, no rubs, no murmurs ABD:  Flat, positive bowel sounds normal in frequency in pitch, no bruits, no rebound, no guarding, no midline pulsatile mass, no hepatomegaly, no splenomegaly EXT:  2 plus pulses throughout, no edema,  no cyanosis no clubbing SKIN:  No rashes no nodules NEURO:  Cranial nerves II through XII grossly intact, motor grossly intact throughout PSYCH:  Cognitively intact, oriented to person place and time    EKG:  EKG is ordered today. The ekg ordered today demonstrates sinus rhythm, rate 76, axis within normal limits, intervals within normal limits, no acute ST-T wave changes.   Recent Labs: No results found for requested labs within last 8760 hours.    Lipid Panel No results found for: CHOL, TRIG, HDL, CHOLHDL, VLDL, LDLCALC, LDLDIRECT    Wt Readings from Last 3 Encounters:  08/28/19 197 lb 6.4 oz (89.5 kg)      Other studies Reviewed: Additional studies/ records that were reviewed today include: Echo. Review of the above records  demonstrates:  Please see elsewhere in the note.     ASSESSMENT AND PLAN:  ABNORMAL ECHO: This is very minimally abnormal.  I do not appreciate a significant murmur.  I think I can follow this clinically without repeat echo at this point.  AORTIC ROOT ENLARGEMENT: He has a slight aortic enlargement.  I do not think this will be an issue but would like to get a baseline CT.  DYSLIPIDEMIA: He is LD is 32 with a total of 160.  He is Framingham risk score is 18.4.  It is reasonable to continue the low-dose of Lipitor and the aspirin.  We had a discussion.   Current medicines are reviewed at length with the patient today.  The patient does not have concerns regarding medicines.  The following changes have been made:  no change  Labs/ tests ordered today include:   Orders Placed This Encounter  Procedures  . CT ANGIO CHEST AORTA W &/OR WO CONTRAST  . Basic metabolic panel  . EKG 12-Lead     Disposition:   FU with me in one year.      Signed, Minus Breeding, MD  08/28/2019 11:00 AM    [place]

## 2019-08-28 ENCOUNTER — Encounter: Payer: Self-pay | Admitting: Cardiology

## 2019-08-28 ENCOUNTER — Ambulatory Visit (INDEPENDENT_AMBULATORY_CARE_PROVIDER_SITE_OTHER): Payer: Medicare HMO | Admitting: Cardiology

## 2019-08-28 ENCOUNTER — Other Ambulatory Visit: Payer: Self-pay

## 2019-08-28 VITALS — BP 136/60 | HR 76 | Temp 97.9°F | Ht 71.0 in | Wt 197.4 lb

## 2019-08-28 DIAGNOSIS — Z01812 Encounter for preprocedural laboratory examination: Secondary | ICD-10-CM | POA: Diagnosis not present

## 2019-08-28 DIAGNOSIS — I712 Thoracic aortic aneurysm, without rupture, unspecified: Secondary | ICD-10-CM | POA: Insufficient documentation

## 2019-08-28 DIAGNOSIS — R9431 Abnormal electrocardiogram [ECG] [EKG]: Secondary | ICD-10-CM

## 2019-08-28 NOTE — Patient Instructions (Addendum)
Medication Instructions:  Your physician recommends that you continue on your current medications as directed. Please refer to the Current Medication list given to you today.  If you need a refill on your cardiac medications before your next appointment, please call your pharmacy.   Lab work: BMET 1 WEEK PRIOR TO CT  Testing/Procedures: Non-Cardiac CT Angiography (CTA), is a special type of CT scan that uses a computer to produce multi-dimensional views of major blood vessels throughout the body. In CT angiography, a contrast material is injected through an IV to help visualize the blood vessels.   Follow-Up: At [facility], you and your health needs are our priority.  As part of our continuing mission to provide you with exceptional heart care, we have created designated Provider Care Teams.  These Care Teams include your primary Cardiologist (physician) and Advanced Practice Providers (APPs -  Physician Assistants and Nurse Practitioners) who all work together to provide you with the care you need, when you need it. You will need a follow up appointment in 12 months.  Please call our office 2 months in advance to schedule this appointment.  You may see Minus Breeding, MD or one of the following Advanced Practice Providers on your designated Care Team:   Rosaria Ferries, PA-C Jory Sims, DNP, ANP  Any Other Special Instructions Will Be Listed Below (If Applicable).  CT Angiogram  A CT angiogram is a procedure to look at the blood vessels in various areas of the body. For this procedure, a large X-ray machine, called a CT scanner, takes detailed pictures of blood vessels that have been injected with a dye (contrast material). A CT angiogram allows your health care provider to see how well blood is flowing to the area of your body that is being checked. Your health care provider will be able to see if there are any problems, such as a blockage. Tell a health care provider about:  Any  allergies you have.  All medicines you are taking, including vitamins, herbs, eye drops, creams, and over-the-counter medicines.  Any problems you or family members have had with anesthetic medicines.  Any blood disorders you have.  Any surgeries you have had.  Any medical conditions you have.  Whether you are pregnant or may be pregnant.  Whether you are breastfeeding.  Any anxiety disorders, chronic pain, or other conditions you have that may increase your stress or prevent you from lying still. What are the risks? Generally, this is a safe procedure. However, problems may occur, including:  Infection.  Bleeding.  Allergic reactions to medicines or dyes.  Damage to other structures or organs.  Kidney damage from the dye or contrast that is used.  Increased risk of cancer from radiation exposure. This risk is low. Talk with your health care provider about: ? The risks and benefits of testing. ? How you can receive the lowest dose of radiation. What happens before the procedure?  Wear comfortable clothing and remove any jewelry.  Follow instructions from your health care provider about eating and drinking. For most people, instructions may include these actions: ? For 12 hours before the test, avoid caffeine. This includes tea, coffee, soda, and energy drinks or pills. ? For 3-4 hours before the test, stop eating or drinking anything but water. ? Stay well hydrated by continuing to drink water before the exam. This will help to clear the contrast dye from your body after the test.  Ask your health care provider about changing or stopping  your regular medicines. This is especially important if you are taking diabetes medicines or blood thinners. What happens during the procedure?  An IV tube will be inserted into one of your veins.  You will be asked to lie on an exam table. This table will slide in and out of the CT machine during the procedure.  Contrast dye will be  injected into the IV tube. You might feel warm, or you may get a metallic taste in your mouth.  The table that you are lying on will move into the CT machine tunnel for the scan.  The person running the machine will give you instructions while the scans are being done. You may be asked to: ? Keep your arms above your head. ? Hold your breath. ? Stay very still, even if the table is moving.  When the scanning is complete, you will be moved out of the machine.  The IV tube will be removed. The procedure may vary among health care providers and hospitals. What happens after the procedure?  You might feel warm, or you may get a metallic taste in your mouth.  You may be asked to drink water or other fluids to wash (flush) the contrast material out of your body.  It is up to you to get the results of your procedure. Ask your health care provider, or the department that is doing the procedure, when your results will be ready. Summary  A CT angiogram is a procedure to look at the blood vessels in various areas of the body.  You will need to stay very still during the exam.  You may be asked to drink water or other fluids to wash (flush) the contrast material out of your body after your scan. This information is not intended to replace advice given to you by your health care provider. Make sure you discuss any questions you have with your health care provider. Document Released: 07/19/2016 Document Revised: 01/29/2019 Document Reviewed: 07/19/2016 Elsevier Patient Education  2020 [employer].

## 2019-09-11 ENCOUNTER — Other Ambulatory Visit: Payer: Self-pay | Admitting: Cardiology

## 2019-09-11 NOTE — Telephone Encounter (Signed)
Pt calling stating that Dr. Percival Spanish said that he would refill his Atorvastatin. Pt needs this medication to be sent to [facility] mail order pharmacy. Please address

## 2019-09-11 NOTE — Telephone Encounter (Signed)
Please advise if ok to refill Atorvastatin 20 mg qd. Last filled historical provider.

## 2019-09-14 MED ORDER — ATORVASTATIN CALCIUM 20 MG PO TABS: 20.0000 mg | ORAL_TABLET | Freq: Every day | ORAL | 3 refills | Status: DC

## 2019-09-14 NOTE — Telephone Encounter (Signed)
Refilled as requested  

## 2019-09-14 NOTE — Telephone Encounter (Signed)
Please advised  

## 2019-09-18 DIAGNOSIS — Z01812 Encounter for preprocedural laboratory examination: Secondary | ICD-10-CM | POA: Diagnosis not present

## 2019-09-18 DIAGNOSIS — I712 Thoracic aortic aneurysm, without rupture: Secondary | ICD-10-CM | POA: Diagnosis not present

## 2019-09-18 DIAGNOSIS — R9431 Abnormal electrocardiogram [ECG] [EKG]: Secondary | ICD-10-CM | POA: Diagnosis not present

## 2019-09-19 LAB — BASIC METABOLIC PANEL
BUN/Creatinine Ratio: 18 (ref 10–24)
BUN: 14 mg/dL (ref 8–27)
CO2: 25 mmol/L (ref 20–29)
Calcium: 9.3 mg/dL (ref 8.6–10.2)
Chloride: 104 mmol/L (ref 96–106)
Creatinine, Ser: 0.8 mg/dL (ref 0.76–1.27)
GFR calc Af Amer: 100 mL/min/{1.73_m2} (ref >59)
GFR calc non Af Amer: 86 mL/min/{1.73_m2} (ref >59)
Glucose: 96 mg/dL (ref 65–99)
Potassium: 5.1 mmol/L (ref 3.5–5.2)
Sodium: 143 mmol/L (ref 134–144)

## 2019-09-24 ENCOUNTER — Other Ambulatory Visit: Payer: Self-pay

## 2019-09-24 ENCOUNTER — Ambulatory Visit (INDEPENDENT_AMBULATORY_CARE_PROVIDER_SITE_OTHER)
Admission: RE | Admit: 2019-09-24 | Discharge: 2019-09-24 | Disposition: A | Discharge status: Home / Self Care | Payer: Medicare HMO | Source: Ambulatory Visit | Attending: Cardiology | Admitting: Cardiology

## 2019-09-24 DIAGNOSIS — R9431 Abnormal electrocardiogram [ECG] [EKG]: Secondary | ICD-10-CM

## 2019-09-24 DIAGNOSIS — I712 Thoracic aortic aneurysm, without rupture, unspecified: Secondary | ICD-10-CM

## 2019-09-24 DIAGNOSIS — Z8679 Personal history of other diseases of the circulatory system: Secondary | ICD-10-CM | POA: Diagnosis not present

## 2019-09-24 MED ORDER — IOHEXOL 350 MG/ML SOLN
100.0000 mL | Freq: Once | INTRAVENOUS | Status: AC | PRN
  Administered 2019-09-24: 100 mL via INTRAVENOUS

## 2019-09-28 ENCOUNTER — Telehealth: Payer: Self-pay | Admitting: Cardiology

## 2019-09-28 DIAGNOSIS — R9431 Abnormal electrocardiogram [ECG] [EKG]: Secondary | ICD-10-CM

## 2019-09-28 DIAGNOSIS — Z8249 Family history of ischemic heart disease and other diseases of the circulatory system: Secondary | ICD-10-CM

## 2019-09-28 NOTE — Telephone Encounter (Signed)
Returned call to pt he states that he was just receiving his CT results with Dr Hochrein's nurse and forgot to mention to her that his brother was 77 YO male he states that he "just died" from a sudden MI and was waiting to go to the CATH lab to have test with possible CABG and died suddenly before he could have any of that testing/ procedure done. He thought that Dr Percival Spanish should know.

## 2019-09-28 NOTE — Telephone Encounter (Signed)
Patient is calling back in regards to a question he has regarding his CT.

## 2019-09-30 NOTE — Telephone Encounter (Signed)
It would not be unreasonable with his coronary calcium noted and that family history for him to have a screening POET ([employer] Exercise Treadmill).  Please call and arrange.

## 2019-10-01 ENCOUNTER — Encounter: Payer: Self-pay | Admitting: Cardiology

## 2019-10-01 NOTE — Telephone Encounter (Signed)
Advised patient and sent message to scheduling to arrange.  Patient aware he will need COVID screening 3 days prior

## 2019-10-06 ENCOUNTER — Telehealth: Payer: Self-pay | Admitting: *Deleted

## 2019-10-06 NOTE — Telephone Encounter (Addendum)
Patient scheduled for COVID test 11/10. Aware of time, location, and to quarantine afterwards

## 2019-10-06 NOTE — Telephone Encounter (Signed)
-----   Message from Roland Earl sent at 10/01/2019 11:32 AM EDT ----- Regarding: ETT COVID TESTING SCHEDULING TEMPLATE FOR [place]( [address]) IF PROCEDURE IS ON...                           THEN TEST ON... MONDAY THURSDAY (9-3:30) TUESDAY FRIDAY (9-3:30) WEDNESDAY SATURDAY (9-12:30) [place] (9-3:30) FRIDAY TUESDAY (9-3:30)                       #Wednesday testing is only 9-12#   There is a GXT scheduled on Name:  Miguel Walker, a patient of Dr. Percival Spanish, on Friday 10/16/19.    Please call the patient and  schedule/order Covid testing by Tuesday 10/13/19  at  [place] and instruct the patient to self-isolate after the Covid testing is performed until the GXT appointment.  If the patient does not have the Covid testing performed and if we don't have the testing results back prior to the GXT appointment, we will have to cancel the test.

## 2019-10-07 DIAGNOSIS — R32 Unspecified urinary incontinence: Secondary | ICD-10-CM | POA: Diagnosis not present

## 2019-10-07 DIAGNOSIS — Z923 Personal history of irradiation: Secondary | ICD-10-CM | POA: Diagnosis not present

## 2019-10-07 DIAGNOSIS — Z8546 Personal history of malignant neoplasm of prostate: Secondary | ICD-10-CM | POA: Diagnosis not present

## 2019-10-07 DIAGNOSIS — Z9079 Acquired absence of other genital organ(s): Secondary | ICD-10-CM | POA: Diagnosis not present

## 2019-10-07 DIAGNOSIS — Z125 Encounter for screening for malignant neoplasm of prostate: Secondary | ICD-10-CM | POA: Diagnosis not present

## 2019-10-13 ENCOUNTER — Other Ambulatory Visit (HOSPITAL_COMMUNITY)
Admission: RE | Admit: 2019-10-13 | Discharge: 2019-10-13 | Disposition: A | Discharge status: Home / Self Care | Payer: Medicare HMO | Source: Ambulatory Visit | Attending: Cardiology | Admitting: Cardiology

## 2019-10-13 DIAGNOSIS — Z20828 Contact with and (suspected) exposure to other viral communicable diseases: Secondary | ICD-10-CM | POA: Diagnosis not present

## 2019-10-13 DIAGNOSIS — Z01812 Encounter for preprocedural laboratory examination: Secondary | ICD-10-CM | POA: Diagnosis not present

## 2019-10-14 ENCOUNTER — Telehealth (HOSPITAL_COMMUNITY): Payer: Self-pay

## 2019-10-14 NOTE — Telephone Encounter (Signed)
Encounter complete. 

## 2019-10-15 LAB — NOVEL CORONAVIRUS, NAA (HOSP ORDER, SEND-OUT TO REF LAB; TAT 18-24 HRS): SARS-CoV-2, NAA: NOT DETECTED

## 2019-10-16 ENCOUNTER — Other Ambulatory Visit: Payer: Self-pay

## 2019-10-16 ENCOUNTER — Ambulatory Visit (HOSPITAL_COMMUNITY)
Admission: RE | Admit: 2019-10-16 | Discharge: 2019-10-16 | Disposition: A | Discharge status: Home / Self Care | Payer: Medicare HMO | Source: Ambulatory Visit | Attending: Internal Medicine | Admitting: Internal Medicine

## 2019-10-16 DIAGNOSIS — Z8249 Family history of ischemic heart disease and other diseases of the circulatory system: Secondary | ICD-10-CM | POA: Diagnosis not present

## 2019-10-16 DIAGNOSIS — R9431 Abnormal electrocardiogram [ECG] [EKG]: Secondary | ICD-10-CM | POA: Diagnosis not present

## 2019-10-16 LAB — EXERCISE TOLERANCE TEST
Estimated workload: 10.1 METS
Exercise duration (min): 8 min
Exercise duration (sec): 21 s
MPHR: 143 {beats}/min
Peak HR: 139 {beats}/min
Percent HR: 97 %
Rest HR: 74 {beats}/min

## 2019-10-19 DIAGNOSIS — D1801 Hemangioma of skin and subcutaneous tissue: Secondary | ICD-10-CM | POA: Diagnosis not present

## 2019-10-19 DIAGNOSIS — L821 Other seborrheic keratosis: Secondary | ICD-10-CM | POA: Diagnosis not present

## 2019-10-19 DIAGNOSIS — L814 Other melanin hyperpigmentation: Secondary | ICD-10-CM | POA: Diagnosis not present

## 2019-10-19 DIAGNOSIS — L718 Other rosacea: Secondary | ICD-10-CM | POA: Diagnosis not present

## 2019-10-19 DIAGNOSIS — Z872 Personal history of diseases of the skin and subcutaneous tissue: Secondary | ICD-10-CM | POA: Diagnosis not present

## 2019-10-19 DIAGNOSIS — L2084 Intrinsic (allergic) eczema: Secondary | ICD-10-CM | POA: Diagnosis not present

## 2019-10-19 DIAGNOSIS — D225 Melanocytic nevi of trunk: Secondary | ICD-10-CM | POA: Diagnosis not present

## 2019-10-21 ENCOUNTER — Telehealth: Payer: Self-pay | Admitting: Cardiology

## 2019-10-21 DIAGNOSIS — R202 Paresthesia of skin: Secondary | ICD-10-CM | POA: Diagnosis not present

## 2019-10-21 DIAGNOSIS — I712 Thoracic aortic aneurysm, without rupture: Secondary | ICD-10-CM | POA: Diagnosis not present

## 2019-10-21 DIAGNOSIS — H6123 Impacted cerumen, bilateral: Secondary | ICD-10-CM | POA: Diagnosis not present

## 2019-10-21 NOTE — Telephone Encounter (Signed)
New message   Please call patient with stress test results. Please call after 3

## 2019-10-21 NOTE — Telephone Encounter (Signed)
Follow up   Patient is calling in reference to ETT results. Please call.

## 2019-10-21 NOTE — Telephone Encounter (Signed)
Returned call to patient he was calling for stress test results he had on 10/16/19.Advised results not available.I will send message to Dr.Hochrein.

## 2019-10-23 ENCOUNTER — Telehealth: Payer: Self-pay | Admitting: *Deleted

## 2019-10-23 DIAGNOSIS — Z8249 Family history of ischemic heart disease and other diseases of the circulatory system: Secondary | ICD-10-CM

## 2019-10-23 MED ORDER — ATORVASTATIN CALCIUM 20 MG PO TABS: 20.0000 mg | ORAL_TABLET | Freq: Every day | ORAL | 3 refills | Status: DC

## 2019-10-23 NOTE — Telephone Encounter (Signed)
-----   Message from Minus Breeding, MD sent at 10/22/2019  1:08 PM EST ----- This was slightly abnormal but late in the study.  It was not a high risk finding.  He and I had a long discussion and we have agreed to manage it medically.  I will increase his Lipitor to 80 mg daily.  Needs a repeat a liver and lipid profile in 10 weeks.  [facility]  [phone] Fax Number.  Follow up with me in six months.

## 2019-10-23 NOTE — Telephone Encounter (Signed)
New script sent to the pharmacy and Lab orders mailed to the pt

## 2019-10-26 NOTE — Telephone Encounter (Signed)
Dr Percival Spanish discussed results with patient

## 2019-10-28 MED ORDER — ATORVASTATIN CALCIUM 80 MG PO TABS: 80.0000 mg | ORAL_TABLET | Freq: Every day | ORAL | 12 refills | Status: DC

## 2019-10-28 NOTE — Telephone Encounter (Signed)
Follow up   Patient states that this medication was sent to the pharmacy for the wrong dosage it should be 80 mg per Dr. Percival Spanish. atorvastatin (LIPITOR) 20 MG tablet  Please call to discuss.

## 2019-10-28 NOTE — Addendum Note (Signed)
Addended by: Waylan Rocher on: 10/28/2019 02:01 PM   Modules accepted: Orders

## 2019-10-28 NOTE — Telephone Encounter (Signed)
New 80mg  rx sent to the pharmacy

## 2019-11-02 ENCOUNTER — Other Ambulatory Visit: Payer: Self-pay

## 2019-11-02 ENCOUNTER — Telehealth: Payer: Self-pay | Admitting: Cardiology

## 2019-11-02 ENCOUNTER — Telehealth: Payer: Self-pay

## 2019-11-02 DIAGNOSIS — Z8249 Family history of ischemic heart disease and other diseases of the circulatory system: Secondary | ICD-10-CM

## 2019-11-02 MED ORDER — ATORVASTATIN CALCIUM 80 MG PO TABS: 80.0000 mg | ORAL_TABLET | Freq: Every day | ORAL | 12 refills | Status: DC

## 2019-11-02 MED ORDER — ATORVASTATIN CALCIUM 80 MG PO TABS: 80.0000 mg | ORAL_TABLET | Freq: Every day | ORAL | 3 refills | Status: DC

## 2019-11-02 NOTE — Telephone Encounter (Signed)
Will verify with MD that dose should be 80mg , only change I see if patient saying it should be changed.

## 2019-11-02 NOTE — Telephone Encounter (Signed)
Pharmacist from [facility] is calling to clarify Atorvastatin dose. Patient was on 20MG  and RX was sent for 80MG . Pharmacist would like to make sure this is correct and that it was a dose increase. Reference number for call is [id number] and the phone number is [phone]

## 2019-11-02 NOTE — Telephone Encounter (Signed)
Pt c/o medication issue:  1. Name of Medication:   atorvastatin (LIPITOR) 80 MG tablet   2. How are you currently taking this medication (dosage and times per day)?   3. Are you having a reaction (difficulty breathing--STAT)? No  4. What is your medication issue? Patient is calling stating order put in for   atorvastatin (LIPITOR) 80 MG tablet  Was put on hold due to pharmacy stating they need more information on medication. Pharmacy Help Desk # [phone] [facility] Order # [id number]

## 2019-11-02 NOTE — Telephone Encounter (Signed)
  Called and verified dose with Pharm D   Notes recorded by Minus Breeding, MD on 10/22/2019 at 1:08 PM EST  This was slightly abnormal but late in the study. It was not a high risk finding. He and I had a long discussion and we have agreed to manage it medically. I will increase his Lipitor to 80 mg daily. Needs a repeat a liver and lipid profile in 10 weeks. [facility] [phone] Fax Number. Follow up with me in six months.

## 2019-11-02 NOTE — Telephone Encounter (Signed)
Spoke to patient he stated he needed another 90 refill for Atorvastatin sent to [facility].Advised I will send refill in.

## 2019-12-14 ENCOUNTER — Ambulatory Visit: Payer: Medicare Other | Attending: Internal Medicine

## 2019-12-14 DIAGNOSIS — Z23 Encounter for immunization: Secondary | ICD-10-CM | POA: Insufficient documentation

## 2019-12-14 NOTE — Progress Notes (Signed)
   Covid-19 Vaccination Clinic  Name:  Miguel Walker    MRN: [id number] DOB: [birth year]  12/14/2019  Mr. Largent was observed post Covid-19 immunization for 15 minutes without incidence. He was provided with Vaccine Information Sheet and instruction to access the V-Safe system.   Mr. Fasciano was instructed to call 911 with any severe reactions post vaccine: Marland Kitchen Difficulty breathing  . Swelling of your face and throat  . A fast heartbeat  . A bad rash all over your body  . Dizziness and weakness

## 2020-01-03 ENCOUNTER — Ambulatory Visit: Payer: Medicare HMO | Attending: Internal Medicine

## 2020-01-03 DIAGNOSIS — Z23 Encounter for immunization: Secondary | ICD-10-CM | POA: Insufficient documentation

## 2020-01-03 NOTE — Progress Notes (Signed)
   Covid-19 Vaccination Clinic  Name:  Dmichael Wesler    MRN: [id number] DOB: [birth year]  01/03/2020  Mr. Lucy was observed post Covid-19 immunization for 15 minutes without incidence. He was provided with Vaccine Information Sheet and instruction to access the V-Safe system.   Mr. Shong was instructed to call 911 with any severe reactions post vaccine: Marland Kitchen Difficulty breathing  . Swelling of your face and throat  . A fast heartbeat  . A bad rash all over your body  . Dizziness and weakness    Immunizations Administered    Name Date Dose VIS Date Route   Pfizer COVID-19 Vaccine 01/03/2020  9:48 AM 0.3 mL 11/13/2019 Intramuscular   Manufacturer: [place]   Lot: [id number]   [place]: [id number]

## 2020-01-16 LAB — LIPID PANEL
Chol/HDL Ratio: 3.2 ratio (ref 0.0–5.0)
Cholesterol, Total: 148 mg/dL (ref 100–199)
HDL: 46 mg/dL (ref >39)
LDL Chol Calc (NIH): 76 mg/dL (ref 0–99)
Triglycerides: 152 mg/dL — ABNORMAL HIGH (ref 0–149)
VLDL Cholesterol Cal: 26 mg/dL (ref 5–40)

## 2020-01-16 LAB — HEPATIC FUNCTION PANEL
ALT: 34 IU/L (ref 0–44)
AST: 30 IU/L (ref 0–40)
Albumin: 4.2 g/dL (ref 3.7–4.7)
Alkaline Phosphatase: 92 IU/L (ref 39–117)
Bilirubin Total: 0.7 mg/dL (ref 0.0–1.2)
Bilirubin, Direct: 0.16 mg/dL (ref 0.00–0.40)
Total Protein: 6.8 g/dL (ref 6.0–8.5)

## 2020-01-21 ENCOUNTER — Telehealth: Payer: Self-pay | Admitting: Cardiology

## 2020-01-21 NOTE — Telephone Encounter (Signed)
Spoke to patient recent lab results given. 

## 2020-01-21 NOTE — Telephone Encounter (Signed)
New Message   Pt is calling about lab results and is wondering if everything is okay    Please call

## 2020-05-04 ENCOUNTER — Telehealth: Payer: Self-pay | Admitting: Cardiology

## 2020-05-04 DIAGNOSIS — E7849 Other hyperlipidemia: Secondary | ICD-10-CM

## 2020-05-04 DIAGNOSIS — Z5181 Encounter for therapeutic drug level monitoring: Secondary | ICD-10-CM

## 2020-05-04 DIAGNOSIS — Z8249 Family history of ischemic heart disease and other diseases of the circulatory system: Secondary | ICD-10-CM

## 2020-05-04 NOTE — Telephone Encounter (Signed)
  Spoke with patient and he would like HgbA1c, Lipid, CMET Per patient has family history of diabetes Labs ordered and mailed to patient

## 2020-05-04 NOTE — Telephone Encounter (Signed)
Patient states he will need lab work prior to his appt on 08/29/20. He wants to know if the orders can be mailed to him. Please advise.

## 2020-08-23 LAB — COMPREHENSIVE METABOLIC PANEL
ALT: 26 IU/L (ref 0–44)
AST: 30 IU/L (ref 0–40)
Albumin/Globulin Ratio: 1.8 (ref 1.2–2.2)
Albumin: 4.6 g/dL (ref 3.7–4.7)
Alkaline Phosphatase: 91 IU/L (ref 44–121)
BUN/Creatinine Ratio: 23 (ref 10–24)
BUN: 19 mg/dL (ref 8–27)
Bilirubin Total: 1 mg/dL (ref 0.0–1.2)
CO2: 26 mmol/L (ref 20–29)
Calcium: 9.6 mg/dL (ref 8.6–10.2)
Chloride: 102 mmol/L (ref 96–106)
Creatinine, Ser: 0.81 mg/dL (ref 0.76–1.27)
GFR calc Af Amer: 98 mL/min/{1.73_m2} (ref >59)
GFR calc non Af Amer: 85 mL/min/{1.73_m2} (ref >59)
Globulin, Total: 2.6 g/dL (ref 1.5–4.5)
Glucose: 116 mg/dL — ABNORMAL HIGH (ref 65–99)
Potassium: 4.8 mmol/L (ref 3.5–5.2)
Sodium: 141 mmol/L (ref 134–144)
Total Protein: 7.2 g/dL (ref 6.0–8.5)

## 2020-08-23 LAB — LIPID PANEL
Chol/HDL Ratio: 2.6 ratio (ref 0.0–5.0)
Cholesterol, Total: 148 mg/dL (ref 100–199)
HDL: 56 mg/dL (ref >39)
LDL Chol Calc (NIH): 73 mg/dL (ref 0–99)
Triglycerides: 104 mg/dL (ref 0–149)
VLDL Cholesterol Cal: 19 mg/dL (ref 5–40)

## 2020-08-23 LAB — HEMOGLOBIN A1C
Est. average glucose Bld gHb Est-mCnc: 114 mg/dL
Hgb A1c MFr Bld: 5.6 % (ref 4.8–5.6)

## 2020-08-26 ENCOUNTER — Other Ambulatory Visit: Payer: Self-pay | Admitting: Cardiology

## 2020-08-26 DIAGNOSIS — Z8249 Family history of ischemic heart disease and other diseases of the circulatory system: Secondary | ICD-10-CM

## 2020-08-29 ENCOUNTER — Ambulatory Visit: Payer: Medicare HMO | Admitting: Cardiology

## 2020-09-06 DIAGNOSIS — Z7189 Other specified counseling: Secondary | ICD-10-CM | POA: Insufficient documentation

## 2020-09-06 DIAGNOSIS — I7789 Other specified disorders of arteries and arterioles: Secondary | ICD-10-CM | POA: Insufficient documentation

## 2020-09-06 DIAGNOSIS — R931 Abnormal findings on diagnostic imaging of heart and coronary circulation: Secondary | ICD-10-CM | POA: Insufficient documentation

## 2020-09-06 DIAGNOSIS — E785 Hyperlipidemia, unspecified: Secondary | ICD-10-CM | POA: Insufficient documentation

## 2020-09-06 NOTE — Progress Notes (Signed)
Cardiology Office Note   Date:  09/08/2020   ID:  Miguel Walker, DOB 1940/12/05, MRN [id number]  PCP:  London Pepper, MD  Cardiologist:   Minus Breeding, MD Referring:  London Pepper, MD  Chief Complaint  Patient presents with  . Elevated Coronary Calcium      History of Present Illness: Miguel Walker is a 79 y.o. male who is self referred for evaluation of an abnormal echo.   In 2020 an echo demonstrated very slight aortic valve gradient and an aortic root enlargement.  I followed up with a CT which demonstrated 38 - 39 mm enlargement.   Since I last saw him he has done well.  He hikes and he exercises. The patient denies any new symptoms such as chest discomfort, neck or arm discomfort. There has been no new shortness of breath, PND or orthopnea. There have been no reported palpitations, presyncope or syncope.   Past Medical History:  Diagnosis Date  . Aneurysm, ascending aorta (HCC)   . Cancer [facility]) 1997   Prostate cancer  . Dyslipidemia   . Family history of premature CAD    NO ISCHEMIC SYMPTOMS  . Hyperlipidemia   . Hypertension   . Thyroid nodule     Past Surgical History:  Procedure Laterality Date  . CARPAL TUNNEL RELEASE    . HAND SURGERY    . ROTATOR CUFF REPAIR    . URINARY SPHINCTER IMPLANT       Current Outpatient Medications  Medication Sig Dispense Refill  . ascorbic acid (VITAMIN C) 500 MG tablet Take by mouth.    Marland Kitchen aspirin 81 MG tablet Take 81 mg by mouth daily.    Marland Kitchen atorvastatin (LIPITOR) 80 MG tablet TAKE 1 TABLET BY MOUTH  DAILY 90 tablet 3  . Multiple Vitamin (MULTI-VITAMIN) tablet Take 1 tablet by mouth daily.    . Omega-3 1000 MG CAPS Take 1 g by mouth daily.    Marland Kitchen tolterodine (DETROL LA) 4 MG 24 hr capsule Take 4 mg by mouth daily.     No current facility-administered medications for this visit.    Allergies:   Patient has no known allergies.   ROS:  Please see the history of present illness.   Otherwise, review of systems are  positive for none.   All other systems are reviewed and negative.    PHYSICAL EXAM: VS:  BP 112/65   Pulse 79   Ht 5\' 11"  (1.803 m)   Wt 201 lb 3.2 oz (91.3 kg)   SpO2 98%   BMI 28.06 kg/m  , BMI Body mass index is 28.06 kg/m. GENERAL:  Well appearing NECK:  No jugular venous distention, waveform within normal limits, carotid upstroke brisk and symmetric, no bruits, no thyromegaly LUNGS:  Clear to auscultation bilaterally CHEST:  Unremarkable HEART:  PMI not displaced or sustained,S1 and S2 within normal limits, no S3, no S4, no clicks, no rubs, no murmurs ABD:  Flat, positive bowel sounds normal in frequency in pitch, no bruits, no rebound, no guarding, no midline pulsatile mass, no hepatomegaly, no splenomegaly EXT:  2 plus pulses throughout, no edema, no cyanosis no clubbing  EKG:  EKG is   ordered today. The ekg ordered today demonstrates sinus rhythm, rate 79, axis within normal limits, intervals within normal limits, no acute ST-T wave changes.   Recent Labs: 08/22/2020: ALT 26; BUN 19; Creatinine, Ser 0.81; Potassium 4.8; Sodium 141    Lipid Panel    Component Value Date/Time  CHOL 148 08/22/2020 0812   TRIG 104 08/22/2020 0812   HDL 56 08/22/2020 0812   CHOLHDL 2.6 08/22/2020 0812   LDLCALC 73 08/22/2020 0812      Wt Readings from Last 3 Encounters:  09/08/20 201 lb 3.2 oz (91.3 kg)  08/28/19 197 lb 6.4 oz (89.5 kg)      Other studies Reviewed: Additional studies/ records that were reviewed today include: Labs. Review of the above records demonstrates:  Please see elsewhere in the note.     ASSESSMENT AND PLAN:  ABNORMAL ECHO:   He has no murmur.  This was very mild aortic gradient on an echo done in [place].  I will follow this clinically.   AORTIC ROOT ENLARGEMENT: This was very mild.  I might follow this up with a CAT scan next year.  I will see him before this however.   DYSLIPIDEMIA:    LDL was 73 with an HDL of 56.  No change in  therapy.  CORONARY CALCIUM:  He did have this noted in all three vessels on CT. he had no new symptoms.  He had no high risk findings on a treadmill test.  Might consider repeating a treadmill next year as well.  However, again I will make this decision after I see him.  COVID EDUCATION: He has been vaccinated and is scheduled for his booster.   Current medicines are reviewed at length with the patient today.  The patient does not have concerns regarding medicines.  The following changes have been made: None.  Labs/ tests ordered today include:    Orders Placed This Encounter  Procedures  . Hemoglobin A1c  . Hepatic function panel  . Lipid panel  . Basic metabolic panel  . EKG 12-Lead     Disposition:   FU with me one year.    Signed, Minus Breeding, MD  09/08/2020 10:29 AM    [facility]

## 2020-09-08 ENCOUNTER — Encounter: Payer: Self-pay | Admitting: Cardiology

## 2020-09-08 ENCOUNTER — Ambulatory Visit: Payer: Medicare HMO | Admitting: Cardiology

## 2020-09-08 ENCOUNTER — Other Ambulatory Visit: Payer: Self-pay

## 2020-09-08 VITALS — BP 112/65 | HR 79 | Ht 71.0 in | Wt 201.2 lb

## 2020-09-08 DIAGNOSIS — Z8249 Family history of ischemic heart disease and other diseases of the circulatory system: Secondary | ICD-10-CM

## 2020-09-08 DIAGNOSIS — E785 Hyperlipidemia, unspecified: Secondary | ICD-10-CM | POA: Diagnosis not present

## 2020-09-08 DIAGNOSIS — Z7189 Other specified counseling: Secondary | ICD-10-CM | POA: Diagnosis not present

## 2020-09-08 DIAGNOSIS — R931 Abnormal findings on diagnostic imaging of heart and coronary circulation: Secondary | ICD-10-CM

## 2020-09-08 DIAGNOSIS — I7789 Other specified disorders of arteries and arterioles: Secondary | ICD-10-CM | POA: Diagnosis not present

## 2020-09-08 DIAGNOSIS — Z5181 Encounter for therapeutic drug level monitoring: Secondary | ICD-10-CM

## 2020-09-08 NOTE — Patient Instructions (Addendum)
Medication Instructions:  Your physician recommends that you continue on your current medications as directed. Please refer to the Current Medication list given to you today.  *If you need a refill on your cardiac medications before your next appointment, please call your pharmacy*  Lab Work: Your physician recommends that you return for lab work in: 1 YEAR   BMET  Fasting Lipid Panel-DO NOT EAT OR DRINK PAST MIDNIGHT. OKAY TO HAVE WATER  A1c  Hepatic Function test  If you have labs (blood work) drawn today and your tests are completely normal, you will receive your results only by: Marland Kitchen MyChart Message (if you have MyChart) OR . A paper copy in the mail If you have any lab test that is abnormal or we need to change your treatment, we will call you to review the results.  Testing/Procedures: NONE ordered at this time of appointment   Follow-Up: At [facility], you and your health needs are our priority.  As part of our continuing mission to provide you with exceptional heart care, we have created designated Provider Care Teams.  These Care Teams include your primary Cardiologist (physician) and Advanced Practice Providers (APPs -  Physician Assistants and Nurse Practitioners) who all work together to provide you with the care you need, when you need it.  Your next appointment:   1 year(s)  The format for your next appointment:   In Person  Provider:   Minus Breeding, MD  Other Instructions

## 2021-07-29 ENCOUNTER — Other Ambulatory Visit: Payer: Self-pay | Admitting: Cardiology

## 2021-07-29 DIAGNOSIS — Z8249 Family history of ischemic heart disease and other diseases of the circulatory system: Secondary | ICD-10-CM

## 2021-09-06 ENCOUNTER — Other Ambulatory Visit: Payer: Self-pay

## 2021-09-06 DIAGNOSIS — E785 Hyperlipidemia, unspecified: Secondary | ICD-10-CM

## 2021-09-06 DIAGNOSIS — Z8249 Family history of ischemic heart disease and other diseases of the circulatory system: Secondary | ICD-10-CM

## 2021-09-06 DIAGNOSIS — Z5181 Encounter for therapeutic drug level monitoring: Secondary | ICD-10-CM

## 2021-09-06 LAB — HEPATIC FUNCTION PANEL
ALT: 33 IU/L (ref 0–44)
AST: 41 IU/L — ABNORMAL HIGH (ref 0–40)
Albumin: 4.6 g/dL (ref 3.7–4.7)
Alkaline Phosphatase: 92 IU/L (ref 44–121)
Bilirubin Total: 0.6 mg/dL (ref 0.0–1.2)
Bilirubin, Direct: 0.18 mg/dL (ref 0.00–0.40)
Total Protein: 6.8 g/dL (ref 6.0–8.5)

## 2021-09-06 LAB — LIPID PANEL
Chol/HDL Ratio: 3.4 ratio (ref 0.0–5.0)
Cholesterol, Total: 165 mg/dL (ref 100–199)
HDL: 49 mg/dL (ref >39)
LDL Chol Calc (NIH): 90 mg/dL (ref 0–99)
Triglycerides: 151 mg/dL — ABNORMAL HIGH (ref 0–149)
VLDL Cholesterol Cal: 26 mg/dL (ref 5–40)

## 2021-09-06 LAB — BASIC METABOLIC PANEL
BUN/Creatinine Ratio: 29 — ABNORMAL HIGH (ref 10–24)
BUN: 20 mg/dL (ref 8–27)
CO2: 24 mmol/L (ref 20–29)
Calcium: 9.3 mg/dL (ref 8.6–10.2)
Chloride: 102 mmol/L (ref 96–106)
Creatinine, Ser: 0.7 mg/dL — ABNORMAL LOW (ref 0.76–1.27)
Glucose: 97 mg/dL (ref 70–99)
Potassium: 4.6 mmol/L (ref 3.5–5.2)
Sodium: 142 mmol/L (ref 134–144)
eGFR: 94 mL/min/{1.73_m2} (ref >59)

## 2021-09-06 LAB — HEMOGLOBIN A1C
Est. average glucose Bld gHb Est-mCnc: 117 mg/dL
Hgb A1c MFr Bld: 5.7 % — ABNORMAL HIGH (ref 4.8–5.6)

## 2021-09-06 NOTE — Progress Notes (Signed)
Cardiology Office Note   Date:  09/08/2021   ID:  Miguel Walker, DOB 31-Dec-1940, MRN [id number]  PCP:  Miguel Pepper, MD  Cardiologist:   Minus Breeding, MD Referring:  Miguel Pepper, MD  Chief Complaint  Patient presents with   Abnormal Echocardiogram       History of Present Illness: Miguel Walker is a 80 y.o. male who is self referred for evaluation of an abnormal echo.   In 2020 an echo demonstrated very slight aortic valve gradient and an aortic root enlargement.  I followed up with a CT which demonstrated 38 - 39 mm enlargement.   Since I last saw him he has had some back soreness and leg tingling related to driving frequently to [place].  He has relatives up there but he is going to stop doing this as much. The patient denies any new symptoms such as chest discomfort, neck or arm discomfort. There has been no new shortness of breath, PND or orthopnea. There have been no reported palpitations, presyncope or syncope.  He walks for exercise.  He is wanting to golf.   Past Medical History:  Diagnosis Date   Aneurysm, ascending aorta    Cancer ([place]) 1997   Prostate cancer   Dyslipidemia    Family history of premature CAD    NO ISCHEMIC SYMPTOMS   Hyperlipidemia    Hypertension    Thyroid nodule     Past Surgical History:  Procedure Laterality Date   CARPAL TUNNEL RELEASE     HAND SURGERY     ROTATOR CUFF REPAIR     URINARY SPHINCTER IMPLANT       Current Outpatient Medications  Medication Sig Dispense Refill   ascorbic acid (VITAMIN C) 500 MG tablet Take by mouth.     aspirin 81 MG tablet Take 81 mg by mouth daily.     atorvastatin (LIPITOR) 80 MG tablet Take 1 tablet (80 mg total) by mouth daily. KEEP OV. 90 tablet 0   Multiple Vitamin (MULTI-VITAMIN) tablet Take 1 tablet by mouth daily.     Omega-3 1000 MG CAPS Take 1 g by mouth daily.     tolterodine (DETROL LA) 4 MG 24 hr capsule Take 4 mg by mouth daily.     No current facility-administered  medications for this visit.    Allergies:   Patient has no known allergies.   ROS:  Please see the history of present illness.   Otherwise, review of systems are positive for none.   All other systems are reviewed and negative.    PHYSICAL EXAM: VS:  BP 120/80 (BP Location: Right Arm)   Pulse 68   Ht 5\' 11"  (1.803 m)   Wt 203 lb 12.8 oz (92.4 kg)   SpO2 98%   BMI 28.42 kg/m  , BMI Body mass index is 28.42 kg/m. GENERAL:  Well appearing NECK:  No jugular venous distention, waveform within normal limits, carotid upstroke brisk and symmetric, no bruits, no thyromegaly LUNGS:  Clear to auscultation bilaterally CHEST:  Unremarkable HEART:  PMI not displaced or sustained,S1 and S2 within normal limits, no S3, no S4, no clicks, no rubs, no murmurs ABD:  Flat, positive bowel sounds normal in frequency in pitch, no bruits, no rebound, no guarding, no midline pulsatile mass, no hepatomegaly, no splenomegaly EXT:  2 plus pulses throughout, no edema, no cyanosis no clubbing   EKG:  EKG is ordered today. The ekg ordered today demonstrates sinus rhythm, rate 68, axis within  normal limits, intervals within normal limits, no acute ST-T wave changes.   Recent Labs: 09/06/2021: ALT 33; BUN 20; Creatinine, Ser 0.70; Potassium 4.6; Sodium 142    Lipid Panel    Component Value Date/Time   CHOL 165 09/06/2021 1025   TRIG 151 (H) 09/06/2021 1025   HDL 49 09/06/2021 1025   CHOLHDL 3.4 09/06/2021 1025   LDLCALC 90 09/06/2021 1025      Wt Readings from Last 3 Encounters:  09/08/21 203 lb 12.8 oz (92.4 kg)  09/08/20 201 lb 3.2 oz (91.3 kg)  08/28/19 197 lb 6.4 oz (89.5 kg)      Other studies Reviewed: Additional studies/ records that were reviewed today include: Labs. Review of the above records demonstrates:  Please see elsewhere in the note.     ASSESSMENT AND PLAN:  ABNORMAL ECHO:   He had a mild aortic valve gradient an echo in the past.  His exam is unchanged.  No further imaging.    AORTIC ROOT ENLARGEMENT: This was 39 mm on echo in 2020.  He will have follow-up CT.   DYSLIPIDEMIA:    LDL was 90 with an HDL 49.  I am going to switch him from Lipitor to Crestor 40 mg daily and check a lipid profile and follow-up.   CORONARY CALCIUM:    He did have this noted in all three vessels on CT.   However, he has had no chest discomfort no high risk findings on a treadmill test not long ago.  He is active and has no symptoms.  No further testing is indicated.    Current medicines are reviewed at length with the patient today.  The patient does not have concerns regarding medicines.  The following changes have been made: As above.  Labs/ tests ordered today include:    No orders of the defined types were placed in this encounter.    Disposition:   FU with me one year.    Signed, Minus Breeding, MD  09/08/2021 8:02 AM    [place]

## 2021-09-08 ENCOUNTER — Other Ambulatory Visit: Payer: Self-pay

## 2021-09-08 ENCOUNTER — Ambulatory Visit: Payer: Medicare HMO | Admitting: Cardiology

## 2021-09-08 ENCOUNTER — Encounter: Payer: Self-pay | Admitting: Cardiology

## 2021-09-08 VITALS — BP 120/80 | HR 68 | Ht 71.0 in | Wt 203.8 lb

## 2021-09-08 DIAGNOSIS — I7789 Other specified disorders of arteries and arterioles: Secondary | ICD-10-CM

## 2021-09-08 DIAGNOSIS — E785 Hyperlipidemia, unspecified: Secondary | ICD-10-CM | POA: Diagnosis not present

## 2021-09-08 DIAGNOSIS — Z5181 Encounter for therapeutic drug level monitoring: Secondary | ICD-10-CM

## 2021-09-08 DIAGNOSIS — R931 Abnormal findings on diagnostic imaging of heart and coronary circulation: Secondary | ICD-10-CM | POA: Diagnosis not present

## 2021-09-08 DIAGNOSIS — I712 Thoracic aortic aneurysm, without rupture, unspecified: Secondary | ICD-10-CM

## 2021-09-08 MED ORDER — ROSUVASTATIN CALCIUM 40 MG PO TABS: 40.0000 mg | ORAL_TABLET | Freq: Every day | ORAL | 3 refills | Status: DC

## 2021-09-08 NOTE — Patient Instructions (Signed)
Medication Instructions:  STOP ATORVASTATIN   START ROSUVASTATIN 40 MG DAILY   *If you need a refill on your cardiac medications before your next appointment, please call your pharmacy*  Lab Work: [place]   If you have labs (blood work) drawn today and your tests are completely normal, you will receive your results only by: [place] (if you have MyChart) OR A paper copy in the mail If you have any lab test that is abnormal or we need to change your treatment, we will call you to review the results.  Testing/Procedures: Non-Cardiac CT Angiography (CTA), is a special type of CT scan that uses a computer to produce multi-dimensional views of major blood vessels throughout the body. In CT angiography, a contrast material is injected through an IV to help visualize the blood vessels  Follow-Up: At [facility], you and your health needs are our priority.  As part of our continuing mission to provide you with exceptional heart care, we have created designated Provider Care Teams.  These Care Teams include your primary Cardiologist (physician) and Advanced Practice Providers (APPs -  Physician Assistants and Nurse Practitioners) who all work together to provide you with the care you need, when you need it.  We recommend signing up for the patient portal called "MyChart".  Sign up information is provided on this After Visit Summary.  MyChart is used to connect with patients for Virtual Visits (Telemedicine).  Patients are able to view lab/test results, encounter notes, upcoming appointments, etc.  Non-urgent messages can be sent to your provider as well.   To learn more about what you can do with MyChart, go to [email or web address].    Your next appointment:   12 month(s)  The format for your next appointment:   In Person  Provider:   You may see Minus Breeding, MD or one of the following Advanced Practice Providers on your designated Care Team:   Rosaria Ferries,  PA-C Caron Presume, PA-C Jory Sims, DNP, ANP

## 2021-09-27 ENCOUNTER — Encounter: Payer: Self-pay | Admitting: Physical Therapy

## 2021-09-27 ENCOUNTER — Other Ambulatory Visit: Payer: Self-pay

## 2021-09-27 ENCOUNTER — Ambulatory Visit: Payer: Medicare HMO | Attending: Family Medicine | Admitting: Physical Therapy

## 2021-09-27 DIAGNOSIS — G8929 Other chronic pain: Secondary | ICD-10-CM | POA: Diagnosis present

## 2021-09-27 DIAGNOSIS — M545 Low back pain, unspecified: Secondary | ICD-10-CM | POA: Insufficient documentation

## 2021-09-27 DIAGNOSIS — R293 Abnormal posture: Secondary | ICD-10-CM | POA: Insufficient documentation

## 2021-09-27 NOTE — Patient Instructions (Signed)
Access Code: [email or web address] URL: [email or web address] Date: 09/27/2021 Prepared by: [place] Night Johnny Gorter  Exercises Sidelying Thoracic Rotation with Open Book - 1 x daily - 7 x weekly - 1 sets - 5 reps Seated Hamstring Stretch - 1 x daily - 7 x weekly - 1 sets - 2 reps - 30 hold Standing Quad Stretch with Towel and Arm Support - 1 x daily - 7 x weekly - 1 sets - 2 reps - 30 hold Supine Piriformis Stretch with Foot on Ground - 1 x daily - 7 x weekly - 1 sets - 2 reps - 30 hold Supine Figure 4 Piriformis Stretch - 1 x daily - 7 x weekly - 1 sets - 2 reps - 30 hold Half Kneeling Hip Flexor Stretch with Sidebend - 1 x daily - 7 x weekly - 1 sets - 3 reps - 10 hold

## 2021-09-27 NOTE — Therapy (Signed)
[facility] @ [address] [place] [place], [place], [address] Phone: [phone]   Fax:  [phone]  Physical Therapy Evaluation  Patient Details  Name: Miguel Walker MRN: [id number] Date of Birth: October 25, 1941 Referring Provider (PT): London Pepper, MD   Encounter Date: 09/27/2021   PT End of Session - 09/27/21 1038     Visit Number 1    Date for PT Re-Evaluation 11/22/21    Authorization Type [employer]    Progress Note Due on Visit 10    PT Start Time 0945    PT Stop Time 1030    PT Time Calculation (min) 45 min    Activity Tolerance Patient tolerated treatment well    Behavior During Therapy [facility] for tasks assessed/performed             Past Medical History:  Diagnosis Date   Aneurysm, ascending aorta    Cancer ([place]) 1997   Prostate cancer   Dyslipidemia    Family history of premature CAD    NO ISCHEMIC SYMPTOMS   Hyperlipidemia    Hypertension    Thyroid nodule     Past Surgical History:  Procedure Laterality Date   CARPAL TUNNEL RELEASE     HAND SURGERY     ROTATOR CUFF REPAIR     URINARY SPHINCTER IMPLANT      There were no vitals filed for this visit.    Subjective Assessment - 09/27/21 0944     Subjective Pt referred to OPPT with LBP and tightness after 3 straight driving trips to [place] over consecutive weeks in Feb 2022.  Just did another drive last week and used lumbar pillow, Aleve and stretches and took more breaks to walk which helped some.  I notice stiffness in low back and both thighs (anterior).  I stretches in the AM and before golf.    Pertinent History PMH: prostate cancer, HLD    Limitations Walking    How long can you sit comfortably? uses lumbar support and tries to limit sitting to 30 min and 2.5 hours when driving    How long can you walk comfortably? 3 miles - tightness in anterior thighs    Patient Stated Goals learn exercises to address lower back and thigh tightness, related  to walking, driving and golf    Currently in Pain? 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  No    Is the patient reluctant to leave their home because of a fear of falling?  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[employer], walking      Cognition   Overall Cognitive Status Within Functional Limits for tasks assessed      Observation/Other Assessments   Focus on Therapeutic Outcomes (FOTO)  67% goal 72%      Posture/Postural Control   Posture/Postural Control Postural limitations    Postural Limitations Forward head;Rounded Shoulders;Increased thoracic kyphosis      ROM / Strength   AROM / PROM / Strength  AROM;PROM;Strength      AROM   Overall AROM Comments limited end range trunk ROM all planes, mostly lumbar ext and bil thoracic rot    AROM Assessment Site Lumbar    Lumbar Flexion full with stiff end range    Lumbar Extension 5    Lumbar - Right Side Bend 12    Lumbar - Left Side Bend 12    Lumbar - Right Rotation limited 30%    Lumbar - Left Rotation limited 30%      PROM   Overall PROM Comments end range bil hip ER and IR limited without pain, end range hip flexion limited without pain      Strength   Overall Strength Comments LEs 5/5 bil      Flexibility   Soft Tissue Assessment /Muscle Length yes   hip flexors limited 25%   Hamstrings limited 30% bil    Quadriceps limited 50% bil    Piriformis limited 25% bil    Quadratus Lumborum limited 25% bil      Palpation   Spinal mobility limited lumbar PAs, limited UPA Rt L5/S1, limited sacral Rt superior pole Lt rotation    SI assessment  mild stiffness Lt>Rt    Palpation comment tight and tender bil multifidi L1-L5      Transfers   Transfers Independent with all Transfers      Ambulation/Gait   Gait Pattern Within Functional Limits                        Objective measurements completed on examination: See above findings.       [facility] Adult PT Treatment/Exercise - 09/27/21 0001       Exercises   Exercises Other Exercises    Other Exercises  initiated HEP with [place]                     PT Education - 09/27/21 1029     Education Details Access Code: [email or web address]    Person(s) Educated Patient    Methods Explanation;Demonstration;Handout    Comprehension Verbalized understanding;Returned demonstration              PT Short Term Goals - 09/27/21 1047       PT SHORT TERM GOAL #1  Title be independent in initial HEP    Time 4    Period Weeks    Status New    Target Date 10/25/21      PT SHORT TERM GOAL #2   Title Pt will report improved stiffness following 3 mile walk by at  least 25%    Baseline -    Time 4    Period Weeks    Status New    Target Date 10/25/21               PT Long Term Goals - 09/27/21 1047       PT LONG TERM GOAL #1   Title be independent in advanced HEP to reduce or eliminate stiffness with 3 mile walks, golf and within long car trips    Time 8    Period Weeks    Status New    Target Date 11/22/21      PT LONG TERM GOAL #2   Title Improve FOTO score to >/= 72% to demo improved function    Time 8    Period Weeks    Status New    Target Date 11/22/21      PT LONG TERM GOAL #3   Title Pt will report reduced stiffness of back and thighs by at least 70% with and following activity    Time 8    Period Weeks    Status New    Target Date 11/22/21      PT LONG TERM GOAL #4   Title Pt will achieve lumbar and thoracic ROM to [employer] with ease to allow for improved body mechanics for golf and bending tasks    Time 8    Period Weeks    Status New    Target Date 11/22/21      PT LONG TERM GOAL #5   Title -                    Plan - 09/27/21 1039     Clinical Impression Statement Pt referred to OPPT for back and bil anterior thigh tightness which began after 3 consecutive long drives to [place] in Feb 5400.  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  Stiffness is worse following walking 3 miles and prolonged sitting.  Pt presents with 5/5 strength in bil LEs.  He has lumbar A/ROM and thoracic A/ROM limitations in extension and bil rotation > flexion and SB.  Overall end range stiffness present in all trunk planes and with bil hip ER/IR and flexion.  Most notably are reduced lumbar PAs L1-L5 and UPAs Rt L5/S1 with associated deep multifidi tightness and soreness L1-L5, along with LE flexibility restrictions in quads, hip flexors, hamstrings, gluteals.  PT initiated LE stretches and open books today for intiial HEP.  Pt is active with walking 3 miles 3x/week and playing golf recreationally.  Pt will benefit from skilled PT to address  findings in today's evaluation to improve his tolerance of work and daily demands.    Personal Factors and Comorbidities Age;Profession;Time since onset of injury/illness/exacerbation;Comorbidity 1    Comorbidities Hx of prostate CA    Examination-Activity Limitations Bend;Sit;Locomotion Level    Examination-Participation Restrictions Occupation    Stability/Clinical Decision Making Stable/Uncomplicated    Clinical Decision Making Low    Rehab Potential Excellent    PT Frequency 1x / week    PT Duration 8 weeks    PT Treatment/Interventions ADLs/Self Care Home Management;Electrical Stimulation;Moist Heat;Cryotherapy;Neuromuscular re-education;Balance training;Therapeutic exercise;Therapeutic activities;Functional  mobility training;Patient/family education;Manual techniques;Dry needling;Passive range of motion;Taping;Joint Manipulations;Spinal Manipulations    PT Next Visit Plan f/u on HEP, add spine ROM (pelvic tilt, hip IR/ER to compliment lower trunk rotation already doing, cat/cow, child's pose, seated ball rollouts), DN lumbar MF    PT Home Exercise Plan Access Code: [email or web address]    Consulted and Agree with Plan of Care Patient             Patient will benefit from skilled therapeutic intervention in order to improve the following deficits and impairments:  Decreased range of motion, Hypomobility, Impaired flexibility, Postural dysfunction, Improper body mechanics, Pain, Increased muscle spasms, Decreased balance  Visit Diagnosis: Chronic midline low back pain without sciatica - Plan: PT plan of care cert/re-cert  Abnormal posture - Plan: PT plan of care cert/re-cert     Problem List Patient Active Problem List   Diagnosis Date Noted   Educated about COVID-19 virus infection 09/06/2020   Dyslipidemia 09/06/2020   Elevated coronary artery calcium score 09/06/2020   Aortic root enlargement ([place]) 09/06/2020   Nonspecific abnormal electrocardiogram (ECG) (EKG) 08/28/2019   Thoracic  aortic aneurysm without rupture 08/28/2019    Baruch Merl, PT 09/27/21 10:51 AM   [facility] @ [address] [place], [place], [address] Phone: [phone]   Fax:  [phone]  Name: Kha Hari MRN: [id number] Date of Birth: Feb 13, 1941

## 2021-09-29 ENCOUNTER — Ambulatory Visit (INDEPENDENT_AMBULATORY_CARE_PROVIDER_SITE_OTHER)
Admission: RE | Admit: 2021-09-29 | Discharge: 2021-09-29 | Disposition: A | Discharge status: Home / Self Care | Payer: Medicare HMO | Source: Ambulatory Visit | Attending: Cardiology | Admitting: Cardiology

## 2021-09-29 ENCOUNTER — Other Ambulatory Visit: Payer: Self-pay

## 2021-09-29 DIAGNOSIS — I7789 Other specified disorders of arteries and arterioles: Secondary | ICD-10-CM

## 2021-09-29 DIAGNOSIS — I712 Thoracic aortic aneurysm, without rupture, unspecified: Secondary | ICD-10-CM

## 2021-09-29 MED ORDER — IOHEXOL 350 MG/ML SOLN
100.0000 mL | Freq: Once | INTRAVENOUS | Status: AC | PRN
  Administered 2021-09-29: 100 mL via INTRAVENOUS

## 2021-10-03 ENCOUNTER — Ambulatory Visit: Payer: Medicare HMO | Attending: Family Medicine | Admitting: Physical Therapy

## 2021-10-03 ENCOUNTER — Encounter: Payer: Self-pay | Admitting: Physical Therapy

## 2021-10-03 ENCOUNTER — Other Ambulatory Visit: Payer: Self-pay

## 2021-10-03 DIAGNOSIS — M545 Low back pain, unspecified: Secondary | ICD-10-CM | POA: Diagnosis present

## 2021-10-03 DIAGNOSIS — G8929 Other chronic pain: Secondary | ICD-10-CM | POA: Insufficient documentation

## 2021-10-03 DIAGNOSIS — R293 Abnormal posture: Secondary | ICD-10-CM | POA: Diagnosis present

## 2021-10-03 NOTE — Therapy (Signed)
[facility] @ [address] [place], [place], [address] Phone: [phone]   Fax:  [phone]  Physical Therapy Treatment  Patient Details  Name: Miguel Walker MRN: [id number] Date of Birth: [birth year] Referring Provider (PT): London Pepper, MD   Encounter Date: 10/03/2021   PT End of Session - 10/03/21 1232     Visit Number 2    Date for PT Re-Evaluation 11/22/21    Authorization Type [employer] Medicare    Progress Note Due on Visit 10    PT Start Time 1147    PT Stop Time 1230    PT Time Calculation (min) 43 min    Activity Tolerance Patient tolerated treatment well    Behavior During Therapy [facility] for tasks assessed/performed             Past Medical History:  Diagnosis Date   Aneurysm, ascending aorta    Cancer ([place]) 1997   Prostate cancer   Dyslipidemia    Family history of premature CAD    NO ISCHEMIC SYMPTOMS   Hyperlipidemia    Hypertension    Thyroid nodule     Past Surgical History:  Procedure Laterality Date   CARPAL TUNNEL RELEASE     HAND SURGERY     ROTATOR CUFF REPAIR     URINARY SPHINCTER IMPLANT      There were no vitals filed for this visit.   Subjective Assessment - 10/03/21 1148     Subjective I am doing the HEP and working on the kneeling QL stretch.  I think I'm getting looser.    Pertinent History PMH: prostate cancer, HLD    Limitations Walking    How long can you sit comfortably? uses lumbar support and tries to limit sitting to 30 min and 2.5 hours when driving    How long can you walk comfortably? 3 miles - tightness in anterior thighs    Patient Stated Goals learn exercises to address lower back and thigh tightness, related to walking, driving and golf    Currently in Pain? Yes    Pain Score 3     Pain Location Back    Pain Orientation Lower;Mid    Pain Descriptors / Indicators Tightness;Dull;Aching    Pain Type Chronic pain    Pain Onset More than a month ago    Pain  Frequency Intermittent                               [facility] Adult PT Treatment/Exercise - 10/03/21 0001       Exercises   Exercises Lumbar;Knee/Hip      Lumbar Exercises: Stretches   Active Hamstring Stretch Left;Right;1 rep;20 seconds    Active Hamstring Stretch Limitations sitting    Lower Trunk Rotation 2 reps;10 seconds    Other Lumbar Stretch Exercise windshield wipers x 10 reps      Lumbar Exercises: Supine   Bridge 5 reps;5 seconds    Bridge Limitations VC to reach knees long to elongate hip flexors and promote lumbar ext      Lumbar Exercises: Quadruped   Madcat/Old Horse 10 reps    Other Quadruped Lumbar Exercises child's pose 3-way x 10 sec each      Knee/Hip Exercises: Stretches   Other Knee/Hip Stretches supine butterfly x 20 sec      Manual Therapy   Manual Therapy Soft tissue mobilization;Joint mobilization;Myofascial release    Joint Mobilization  PAs L1-L5, UPAs bil L1-L5, Gr II/III    Soft tissue mobilization bil lumbar multifidi, lumbar paraspinals, QL and thoracic paraspinal broadening and stripping    Myofascial Release thoracodorsal fascia bil, prone              Trigger Point Dry Needling - 10/03/21 0001     Consent Given? Yes    Muscles Treated Back/Hip Lumbar multifidi    Dry Needling Comments bil L1-L5    Lumbar multifidi Response Palpable increased muscle length                   PT Education - 10/03/21 1206     Education Details added spine stretching and spine ROM, butterfly stretch    Person(s) Educated Patient    Methods Explanation;Demonstration;Handout    Comprehension Verbalized understanding;Returned demonstration              PT Short Term Goals - 09/27/21 1047       PT SHORT TERM GOAL #1   Title be independent in initial HEP    Time 4    Period Weeks    Status New    Target Date 10/25/21      PT SHORT TERM GOAL #2   Title Pt will report improved stiffness following 3 mile walk by at  least 25%    Baseline -    Time 4    Period Weeks    Status New    Target Date 10/25/21               PT Long Term Goals - 09/27/21 1047       PT LONG TERM GOAL #1   Title be independent in advanced HEP to reduce or eliminate stiffness with 3 mile walks, golf and within long car trips    Time 8    Period Weeks    Status New    Target Date 11/22/21      PT LONG TERM GOAL #2   Title Improve FOTO score to >/= 72% to demo improved function    Time 8    Period Weeks    Status New    Target Date 11/22/21      PT LONG TERM GOAL #3   Title Pt will report reduced stiffness of back and thighs by at least 70% with and following activity    Time 8    Period Weeks    Status New    Target Date 11/22/21      PT LONG TERM GOAL #4   Title Pt will achieve lumbar and thoracic ROM to [facility] with ease to allow for improved body mechanics for golf and bending tasks    Time 8    Period Weeks    Status New    Target Date 11/22/21      PT LONG TERM GOAL #5   Title -                   Plan - 10/03/21 1232     Clinical Impression Statement Pt with good compliance and tolerance of initial HEP.  PT added spine and hip ROM progression, butterfly stretch and spine stretching to HEP with good tolerance of trial within session today.  PT performed DN to bil lumbar multifidi without twitch or pain but palpable elongation of tissues.  PT feels low back pain stems more from joint hypomobility throughout thoracic and lumbar paraspinals along with myofascial tightness.  Pt with good tolerance of  manual techniques today working superficial to deep.    Comorbidities Hx of prostate CA    PT Frequency 1x / week    PT Duration 8 weeks    PT Treatment/Interventions ADLs/Self Care Home Management;Electrical Stimulation;Moist Heat;Cryotherapy;Neuromuscular re-education;Balance training;Therapeutic exercise;Therapeutic activities;Functional mobility training;Patient/family education;Manual  techniques;Dry needling;Passive range of motion;Taping;Joint Manipulations;Spinal Manipulations    PT Next Visit Plan f/u on HEP, assess bil quad tension and DN if needed or STM ([place]?), progress core stabilization, continue lumbar and thoracic manual therapy for mobility    PT Home Exercise Plan Access Code: [email or web address]    Consulted and Agree with Plan of Care Patient             Patient will benefit from skilled therapeutic intervention in order to improve the following deficits and impairments:     Visit Diagnosis: Chronic midline low back pain without sciatica  Abnormal posture     Problem List Patient Active Problem List   Diagnosis Date Noted   Educated about COVID-19 virus infection 09/06/2020   Dyslipidemia 09/06/2020   Elevated coronary artery calcium score 09/06/2020   Aortic root enlargement ([place]) 09/06/2020   Nonspecific abnormal electrocardiogram (ECG) (EKG) 08/28/2019   Thoracic aortic aneurysm without rupture 08/28/2019    Baruch Merl, PT 10/03/21 1:28 PM   [facility] @ [place], [place], [address] Phone: [phone]   Fax:  [phone]  Name: Miguel Walker MRN: [id number] Date of Birth: [birth year]

## 2021-10-03 NOTE — Patient Instructions (Signed)
Access Code: [email or web address] URL: [email or web address] Date: 10/03/2021 Prepared by: Venetia Night Daliana Leverett  Exercises Sidelying Thoracic Rotation with Open Book - 1 x daily - 7 x weekly - 1 sets - 5 reps Seated Hamstring Stretch - 1 x daily - 7 x weekly - 1 sets - 2 reps - 30 hold Standing Quad Stretch with Towel and Arm Support - 1 x daily - 7 x weekly - 1 sets - 2 reps - 30 hold Supine Piriformis Stretch with Foot on Ground - 1 x daily - 7 x weekly - 1 sets - 2 reps - 30 hold Supine Figure 4 Piriformis Stretch - 1 x daily - 7 x weekly - 1 sets - 2 reps - 30 hold Half Kneeling Hip Flexor Stretch with Sidebend - 1 x daily - 7 x weekly - 1 sets - 3 reps - 10 hold Cat Cow - 1 x daily - 7 x weekly - 1 sets - 10 reps - 3 hold Child's Pose Stretch - 1 x daily - 7 x weekly - 1 sets - 3 reps - 10 hold Child's Pose with Sidebending - 1 x daily - 7 x weekly - 1 sets - 3 reps - 10 hold Supine Hip Internal and External Rotation - 1 x daily - 7 x weekly - 3 sets - 10 reps Supine Lower Trunk Rotation - 1 x daily - 7 x weekly - 1 sets - 10 reps Supine Bridge - 1 x daily - 7 x weekly - 1 sets - 10 reps - 5 hold Supine Butterfly Groin Stretch - 1 x daily - 7 x weekly - 1 sets - 3 reps - 10 hold

## 2021-10-09 ENCOUNTER — Encounter: Payer: Self-pay | Admitting: *Deleted

## 2021-10-11 ENCOUNTER — Ambulatory Visit: Payer: Medicare HMO | Admitting: Physical Therapy

## 2021-10-11 ENCOUNTER — Encounter: Payer: Self-pay | Admitting: Physical Therapy

## 2021-10-11 ENCOUNTER — Other Ambulatory Visit: Payer: Self-pay

## 2021-10-11 DIAGNOSIS — M545 Low back pain, unspecified: Secondary | ICD-10-CM | POA: Diagnosis not present

## 2021-10-11 DIAGNOSIS — R293 Abnormal posture: Secondary | ICD-10-CM

## 2021-10-11 DIAGNOSIS — G8929 Other chronic pain: Secondary | ICD-10-CM

## 2021-10-11 NOTE — Therapy (Signed)
[facility] @ [place] [place], [place], [address] Phone: [phone]   Fax:  [phone]  Physical Therapy Treatment  Patient Details  Name: Miguel Walker MRN: [id number] Date of Birth: August 29, 1941 Referring Provider (PT): London Pepper, MD   Encounter Date: 10/11/2021   PT End of Session - 10/11/21 1334     Visit Number 3    Date for PT Re-Evaluation 11/22/21    Authorization Type [employer]na Medicare    Progress Note Due on Visit 10    PT Start Time 0930    PT Stop Time 4287    PT Time Calculation (min) 45 min    Activity Tolerance Patient tolerated treatment well    Behavior During Therapy [facility] for tasks assessed/performed             Past Medical History:  Diagnosis Date   Aneurysm, ascending aorta    Cancer ([place]) 1997   Prostate cancer   Dyslipidemia    Family history of premature CAD    NO ISCHEMIC SYMPTOMS   Hyperlipidemia    Hypertension    Thyroid nodule     Past Surgical History:  Procedure Laterality Date   CARPAL TUNNEL RELEASE     HAND SURGERY     ROTATOR CUFF REPAIR     URINARY SPHINCTER IMPLANT      There were no vitals filed for this visit.   Subjective Assessment - 10/11/21 0936     Subjective The thighs are starting to loosen up.  I still have some tightness in the low back.  I am about 20% looser since starting PT. The low back loosens up as the day goes on.    Pertinent History PMH: prostate cancer, HLD    Limitations Walking    How long can you sit comfortably? uses lumbar support and tries to limit sitting to 30 min and 2.5 hours when driving    How long can you walk comfortably? 3 miles - tightness in anterior thighs    Patient Stated Goals learn exercises to address lower back and thigh tightness, related to walking, driving and golf    Currently in Pain? Yes    Pain Score 4     Pain Location Back    Pain Descriptors / Indicators Tightness    Pain Type Chronic pain    Pain  Radiating Towards bil anterior thighs    Pain Onset More than a month ago    Pain Frequency Intermittent    Aggravating Factors  prolonged sitting, after longer walks                               [facility] Adult PT Treatment/Exercise - 10/11/21 0001       Exercises   Exercises Lumbar;Knee/Hip      Lumbar Exercises: Stretches   Lower Trunk Rotation 2 reps;10 seconds    Lower Trunk Rotation Limitations in windshield wiper position    Other Lumbar Stretch Exercise standing golf club rotations bil x 20 reps      Lumbar Exercises: Aerobic   Recumbent Bike L2 x 4' PT present to discuss progress      Lumbar Exercises: Supine   Bridge 5 reps;5 seconds    Other Supine Lumbar Exercises fig 4 bridge x 3 reps each Rt/Lt      Lumbar Exercises: Quadruped   Madcat/Old Horse 5 reps    Straight Leg Raise 5  reps    Straight Leg Raises Limitations quaduped on elbows    Other Quadruped Lumbar Exercises quadruped rocking for posterior hip opening x 15      Knee/Hip Exercises: Stretches   Active Hamstring Stretch Right;Left;1 rep;30 seconds    Active Hamstring Stretch Limitations seated    Hip Flexor Stretch Both;2 reps;30 seconds    Hip Flexor Stretch Limitations kneeling x 1, doorframe with up/over UE reach x 1    Other Knee/Hip Stretches supine butterfly x 20 sec      Manual Therapy   Manual Therapy Soft tissue mobilization    Soft tissue mobilization Lt quad mid-belly and proximally, TP present, Lt gluteals in Rt SL                       PT Short Term Goals - 09/27/21 1047       PT SHORT TERM GOAL #1   Title be independent in initial HEP    Time 4    Period Weeks    Status New    Target Date 10/25/21      PT SHORT TERM GOAL #2   Title Pt will report improved stiffness following 3 mile walk by at least 25%    Baseline -    Time 4    Period Weeks    Status New    Target Date 10/25/21               PT Long Term Goals - 09/27/21 1047        PT LONG TERM GOAL #1   Title be independent in advanced HEP to reduce or eliminate stiffness with 3 mile walks, golf and within long car trips    Time 8    Period Weeks    Status New    Target Date 11/22/21      PT LONG TERM GOAL #2   Title Improve FOTO score to >/= 72% to demo improved function    Time 8    Period Weeks    Status New    Target Date 11/22/21      PT LONG TERM GOAL #3   Title Pt will report reduced stiffness of back and thighs by at least 70% with and following activity    Time 8    Period Weeks    Status New    Target Date 11/22/21      PT LONG TERM GOAL #4   Title Pt will achieve lumbar and thoracic ROM to [facility] with ease to allow for improved body mechanics for golf and bending tasks    Time 8    Period Weeks    Status New    Target Date 11/22/21      PT LONG TERM GOAL #5   Title -                   Plan - 10/11/21 1020     Clinical Impression Statement Pt reports 20% reduction in thigh and lumbar tightness since starting PT.  He did not want to DN today due to concern over soreness for golf game tomorrow.  Lt quad and gluteals with TPs, so STM perormed today with plan to DN next time.  PT introduced doorway hip flexor stretch to allow for UE support/balance with addition of overhead reach to enhance stretch.  Pt needs cueing for cat/cow with initial reps but did demo improved segmental motion with repetition.  He is globally tighter along  Lt lumbar and hemipelvis compared to Rt side.  Continue along POC.    Comorbidities Hx of prostate CA    PT Frequency 1x / week    PT Duration 8 weeks    PT Treatment/Interventions ADLs/Self Care Home Management;Electrical Stimulation;Moist Heat;Cryotherapy;Neuromuscular re-education;Balance training;Therapeutic exercise;Therapeutic activities;Functional mobility training;Patient/family education;Manual techniques;Dry needling;Passive range of motion;Taping;Joint Manipulations;Spinal Manipulations    PT Next  Visit Plan f/u on how golf went, DN Lt quad mid-belly and proximally, Lt glut min/med/piriformis, review stretches for hip flexor in doorway with overhead reach, lumbar and hip stretching review from HEP    PT Home Exercise Plan Access Code: [email or web address]    Consulted and Agree with Plan of Care Patient             Patient will benefit from skilled therapeutic intervention in order to improve the following deficits and impairments:     Visit Diagnosis: Chronic midline low back pain without sciatica  Abnormal posture     Problem List Patient Active Problem List   Diagnosis Date Noted   Educated about COVID-19 virus infection 09/06/2020   Dyslipidemia 09/06/2020   Elevated coronary artery calcium score 09/06/2020   Aortic root enlargement ([place]) 09/06/2020   Nonspecific abnormal electrocardiogram (ECG) (EKG) 08/28/2019   Thoracic aortic aneurysm without rupture 08/28/2019    Baruch Merl, PT 10/11/21 1:39 PM   [facility] @ [place], [place], [address] Phone: [phone]   Fax:  [phone]  Name: Jveon Pound MRN: [id number] Date of Birth: 04-Oct-1941

## 2021-10-24 ENCOUNTER — Other Ambulatory Visit: Payer: Self-pay

## 2021-10-24 ENCOUNTER — Ambulatory Visit: Payer: Medicare HMO

## 2021-10-24 DIAGNOSIS — M545 Low back pain, unspecified: Secondary | ICD-10-CM | POA: Diagnosis not present

## 2021-10-24 DIAGNOSIS — R293 Abnormal posture: Secondary | ICD-10-CM

## 2021-10-24 DIAGNOSIS — G8929 Other chronic pain: Secondary | ICD-10-CM

## 2021-10-24 NOTE — Therapy (Signed)
[facility] @ [place] [address] [place], [place], [address] Phone: [phone]   Fax:  [phone]  Physical Therapy Treatment  Patient Details  Name: Miguel Walker MRN: [id number] Date of Birth: Dec 06, 1940 Referring Provider (PT): London Pepper, MD   Encounter Date: 10/24/2021   PT End of Session - 10/24/21 1323     Visit Number 4    Date for PT Re-Evaluation 11/22/21    Authorization Type A[employer]na Medicare    Progress Note Due on Visit 10    PT Start Time 43    PT Stop Time 7858    PT Time Calculation (min) 47 min    Activity Tolerance Patient tolerated treatment well    Behavior During Therapy [facility] for tasks assessed/performed             Past Medical History:  Diagnosis Date   Aneurysm, ascending aorta    Cancer ([place]) 1997   Prostate cancer   Dyslipidemia    Family history of premature CAD    NO ISCHEMIC SYMPTOMS   Hyperlipidemia    Hypertension    Thyroid nodule     Past Surgical History:  Procedure Laterality Date   CARPAL TUNNEL RELEASE     HAND SURGERY     ROTATOR CUFF REPAIR     URINARY SPHINCTER IMPLANT      There were no vitals filed for this visit.   Subjective Assessment - 10/24/21 1238     Subjective I pulled my Rt calf last week. I was focused on my leg so I didn't do as much stretching.  I'm limiting how much I am walking because I'm not symmetrical.  I am going to stop and stretch on my drive to [place] next week.    Patient Stated Goals learn exercises to address lower back and thigh tightness, related to walking, driving and golf    Currently in Pain? Yes    Pain Score 3     Pain Location Back    Pain Orientation Lower;Mid    Pain Descriptors / Indicators Tightness    Pain Type Chronic pain    Pain Onset More than a month ago    Pain Frequency Intermittent    Aggravating Factors  prolonged sitting, activity    Pain Relieving Factors Aleve, Lumbar pillow, stretching, heat                                [facility] Adult PT Treatment/Exercise - 10/24/21 0001       Lumbar Exercises: Stretches   Active Hamstring Stretch Left;Right;1 rep;20 seconds    Active Hamstring Stretch Limitations sitting    Lower Trunk Rotation 2 reps;10 seconds    Piriformis Stretch 3 reps;20 seconds      Lumbar Exercises: Aerobic   Recumbent Bike L2 x 6'  PT present to discuss progress      Knee/Hip Exercises: Stretches   Other Knee/Hip Stretches supine butterfly x 20 sec      Manual Therapy   Manual Therapy Soft tissue mobilization    Manual therapy comments skilled palpation and monitoring with DN    Soft tissue mobilization bil lumbar multifidi, lumbar paraspinals and bil gluteals              Trigger Point Dry Needling - 10/24/21 0001     Consent Given? Yes    Muscles Treated Back/Hip Lumbar multifidi;Gluteus medius;Gluteus minimus;Gluteus maximus  Dry Needling Comments bil L1-L5    Gluteus Medius Response Twitch response elicited;Palpable increased muscle length    Gluteus Maximus Response Twitch response elicited;Palpable increased muscle length    Lumbar multifidi Response Palpable increased muscle length                     PT Short Term Goals - 10/24/21 1241       PT SHORT TERM GOAL #1   Title be independent in initial HEP    Status On-going      PT SHORT TERM GOAL #2   Title Pt will report improved stiffness following 3 mile walk by at least 25%    Baseline hasn't been walking due to Rt calf injury    Status On-going               PT Long Term Goals - 09/27/21 1047       PT LONG TERM GOAL #1   Title be independent in advanced HEP to reduce or eliminate stiffness with 3 mile walks, golf and within long car trips    Time 8    Period Weeks    Status New    Target Date 11/22/21      PT LONG TERM GOAL #2   Title Improve FOTO score to >/= 72% to demo improved function    Time 8    Period Weeks    Status New     Target Date 11/22/21      PT LONG TERM GOAL #3   Title Pt will report reduced stiffness of back and thighs by at least 70% with and following activity    Time 8    Period Weeks    Status New    Target Date 11/22/21      PT LONG TERM GOAL #4   Title Pt will achieve lumbar and thoracic ROM to [facility] with ease to allow for improved body mechanics for golf and bending tasks    Time 8    Period Weeks    Status New    Target Date 11/22/21      PT LONG TERM GOAL #5   Title -                   Plan - 10/24/21 1319     Clinical Impression Statement Pt strained his Rt calf last week and has been limiting his ability to stretch and walk.  DN has helped his low back pain and is receptive to do it again.  Session spent with instruction in calf flexibility exercises and review of HEP exercises.  PT encouraged pt to return to exercise program as able.  Pt with tension in bil lumbar paraspinals and had good twitch response and improved tissue mobility after manual therapy today.  Pt will continue to benefit from skilled PT to address LBP, tissue mobility, lumbopelvic strength and flexibility.    PT Frequency 1x / week    PT Duration 8 weeks    PT Treatment/Interventions ADLs/Self Care Home Management;Electrical Stimulation;Moist Heat;Cryotherapy;Neuromuscular re-education;Balance training;Therapeutic exercise;Therapeutic activities;Functional mobility training;Patient/family education;Manual techniques;Dry needling;Passive range of motion;Taping;Joint Manipulations;Spinal Manipulations    PT Next Visit Plan continue lumbopelvic strength, flexibility, manual therapy as needed    PT Home Exercise Plan Access Code: [email or web address]    Consulted and Agree with Plan of Care Patient             Patient will benefit from skilled therapeutic intervention in order to improve the  following deficits and impairments:  Decreased range of motion, Hypomobility, Impaired flexibility, Postural dysfunction,  Improper body mechanics, Pain, Increased muscle spasms, Decreased balance  Visit Diagnosis: Chronic midline low back pain without sciatica  Abnormal posture     Problem List Patient Active Problem List   Diagnosis Date Noted   Educated about COVID-19 virus infection 09/06/2020   Dyslipidemia 09/06/2020   Elevated coronary artery calcium score 09/06/2020   Aortic root enlargement ([place]) 09/06/2020   Nonspecific abnormal electrocardiogram (ECG) (EKG) 08/28/2019   Thoracic aortic aneurysm without rupture 08/28/2019   Sigurd Sos, PT 10/24/21 1:26 PM   [facility] @ [place] [address] [place], [place], [address] Phone: [phone]   Fax:  [phone]  Name: Miguel Walker MRN: [id number] Date of Birth: [birth year]

## 2021-10-31 ENCOUNTER — Ambulatory Visit: Payer: Medicare HMO | Admitting: Physical Therapy

## 2021-10-31 ENCOUNTER — Encounter: Payer: Self-pay | Admitting: Physical Therapy

## 2021-10-31 ENCOUNTER — Other Ambulatory Visit: Payer: Self-pay

## 2021-10-31 DIAGNOSIS — M545 Low back pain, unspecified: Secondary | ICD-10-CM

## 2021-10-31 DIAGNOSIS — G8929 Other chronic pain: Secondary | ICD-10-CM

## 2021-10-31 DIAGNOSIS — R293 Abnormal posture: Secondary | ICD-10-CM

## 2021-10-31 NOTE — Therapy (Signed)
[facility] @ [place], [place], [address] Phone: [phone]   Fax:  [phone]  Physical Therapy Treatment  Patient Details  Name: Miguel Walker MRN: [id number] Date of Birth: July 22, 1941 Referring Provider (PT): London Pepper, MD   Encounter Date: 10/31/2021   PT End of Session - 10/31/21 1152     Visit Number 5    Date for PT Re-Evaluation 11/22/21    Authorization Type [employer] Medicare    Progress Note Due on Visit 10    PT Start Time 1145    PT Stop Time 1230    PT Time Calculation (min) 45 min    Activity Tolerance Patient tolerated treatment well    Behavior During Therapy [facility] for tasks assessed/performed             Past Medical History:  Diagnosis Date   Aneurysm, ascending aorta    Cancer ([place]) 1997   Prostate cancer   Dyslipidemia    Family history of premature CAD    NO ISCHEMIC SYMPTOMS   Hyperlipidemia    Hypertension    Thyroid nodule     Past Surgical History:  Procedure Laterality Date   CARPAL TUNNEL RELEASE     HAND SURGERY     ROTATOR CUFF REPAIR     URINARY SPHINCTER IMPLANT      There were no vitals filed for this visit.   Subjective Assessment - 10/31/21 1149     Subjective My calf is better.  I drive to [employer] (Wed) and back on Sun.  I plan to take breaks every 2.5 hours to stretch and walk.  The DN took my pain scale down a notch, is a 4/10 now from a 5/10.    Pertinent History PMH: prostate cancer, HLD    Limitations Walking    How long can you sit comfortably? uses lumbar support and tries to limit sitting to 30 min and 2.5 hours when driving    How long can you walk comfortably? 3 miles - tightness in anterior thighs    Patient Stated Goals learn exercises to address lower back and thigh tightness, related to walking, driving and golf    Currently in Pain? Yes    Pain Score 4     Pain Location Back    Pain Orientation Lower;Mid    Pain Descriptors /  Indicators Tightness    Pain Type Chronic pain    Pain Onset More than a month ago    Pain Frequency Intermittent                               [facility] Adult PT Treatment/Exercise - 10/31/21 0001       Lumbar Exercises: Aerobic   Recumbent Bike L2 x 6'  PT present to discuss progress      Lumbar Exercises: Quadruped   Madcat/Old Horse 5 reps    Madcat/Old Horse Limitations needs cue for technique    Other Quadruped Lumbar Exercises child's pose 3-way x 10 sec each      Knee/Hip Exercises: Stretches   Sports administrator Left;Right;1 rep;30 seconds    Quad Stretch Limitations prone with strap    Hip Flexor Stretch Both;2 reps;10 seconds    Hip Flexor Stretch Limitations doorway with up/over reach      Manual Therapy   Manual Therapy Soft tissue mobilization;Joint mobilization    Joint Mobilization UPAs Lt/Rt L4-S1 facets  Soft tissue mobilization bil lumbar multifidi, Lt quad              Trigger Point Dry Needling - 10/31/21 0001     Consent Given? Yes    Education Handout Provided Previously provided    Muscles Treated Lower Quadrant Vastus lateralis;Rectus femoris    Muscles Treated Back/Hip Lumbar multifidi    Dry Needling Comments bil L1-L5    Other Dry Needling Lt quad    Rectus femoris Response Twitch response elicited;Palpable increased muscle length    Vastus lateralis Response Twitch response elicited;Palpable increased muscle length    Lumbar multifidi Response Twitch response elicited;Palpable increased muscle length                     PT Short Term Goals - 10/24/21 1241       PT SHORT TERM GOAL #1   Title be independent in initial HEP    Status On-going      PT SHORT TERM GOAL #2   Title Pt will report improved stiffness following 3 mile walk by at least 25%    Baseline hasn't been walking due to Rt calf injury    Status On-going               PT Long Term Goals - 09/27/21 1047       PT LONG TERM GOAL #1    Title be independent in advanced HEP to reduce or eliminate stiffness with 3 mile walks, golf and within long car trips    Time 8    Period Weeks    Status New    Target Date 11/22/21      PT LONG TERM GOAL #2   Title Improve FOTO score to >/= 72% to demo improved function    Time 8    Period Weeks    Status New    Target Date 11/22/21      PT LONG TERM GOAL #3   Title Pt will report reduced stiffness of back and thighs by at least 70% with and following activity    Time 8    Period Weeks    Status New    Target Date 11/22/21      PT LONG TERM GOAL #4   Title Pt will achieve lumbar and thoracic ROM to [facility] with ease to allow for improved body mechanics for golf and bending tasks    Time 8    Period Weeks    Status New    Target Date 11/22/21      PT LONG TERM GOAL #5   Title -                   Plan - 10/31/21 1235     Clinical Impression Statement Pt reported reduced LBP since DN last visit, from 5/10 to 4/10. He is driving to [facility] tomorrow and plans to stop to walk and stretch every 2.5 hours.  PT repeated lumbar DN and added Lt quad DN to ongoing TP limiting flexibility of Lt knee flexion.  Pt with improved performance of quad stretching in prone with strap today vs standing.  Improved facet mobility noted end of session with mobs following DN today.  Continue along POC.    Comorbidities Hx of prostate CA    PT Frequency 1x / week    PT Duration 8 weeks    PT Treatment/Interventions ADLs/Self Care Home Management;Electrical Stimulation;Moist Heat;Cryotherapy;Neuromuscular re-education;Balance training;Therapeutic exercise;Therapeutic activities;Functional mobility training;Patient/family education;Manual techniques;Dry  needling;Passive range of motion;Taping;Joint Manipulations;Spinal Manipulations    PT Next Visit Plan f/u on drive to [place] and back and DN #2 last time (lumbar bil and Lt quad), continue manual techniques and spine/hip/LE flexibility    PT Home Exercise  Plan Access Code: [email or web address]    Consulted and Agree with Plan of Care Patient             Patient will benefit from skilled therapeutic intervention in order to improve the following deficits and impairments:     Visit Diagnosis: Chronic midline low back pain without sciatica  Abnormal posture     Problem List Patient Active Problem List   Diagnosis Date Noted   Educated about COVID-19 virus infection 09/06/2020   Dyslipidemia 09/06/2020   Elevated coronary artery calcium score 09/06/2020   Aortic root enlargement ([place]) 09/06/2020   Nonspecific abnormal electrocardiogram (ECG) (EKG) 08/28/2019   Thoracic aortic aneurysm without rupture 08/28/2019    Baruch Merl, PT 10/31/21 12:39 PM   [facility] @ [place], [place], [address] Phone: [phone]   Fax:  [phone]  Name: Miguel Walker MRN: [id number] Date of Birth: 07-31-1941

## 2021-11-07 ENCOUNTER — Encounter: Payer: Self-pay | Admitting: Physical Therapy

## 2021-11-07 ENCOUNTER — Ambulatory Visit: Payer: Medicare HMO | Attending: Family Medicine | Admitting: Physical Therapy

## 2021-11-07 ENCOUNTER — Other Ambulatory Visit: Payer: Self-pay

## 2021-11-07 DIAGNOSIS — G8929 Other chronic pain: Secondary | ICD-10-CM | POA: Insufficient documentation

## 2021-11-07 DIAGNOSIS — M545 Low back pain, unspecified: Secondary | ICD-10-CM | POA: Insufficient documentation

## 2021-11-07 DIAGNOSIS — R293 Abnormal posture: Secondary | ICD-10-CM | POA: Diagnosis present

## 2021-11-07 NOTE — Therapy (Signed)
[facility] @ [place], [place], [address] Phone: [phone]   Fax:  [phone]  Physical Therapy Treatment  Patient Details  Name: Miguel Walker MRN: [id number] Date of Birth: 08/26/1941 Referring Provider (PT): London Pepper, MD   Encounter Date: 11/07/2021   PT End of Session - 11/07/21 1020     Visit Number 6    Date for PT Re-Evaluation 11/22/21    Authorization Type A[employer]na Medicare    Progress Note Due on Visit 10    PT Start Time 1017    PT Stop Time 1112   heat end of session   PT Time Calculation (min) 55 min    Activity Tolerance Patient tolerated treatment well    Behavior During Therapy [facility] for tasks assessed/performed             Past Medical History:  Diagnosis Date   Aneurysm, ascending aorta    Cancer ([place]) 1997   Prostate cancer   Dyslipidemia    Family history of premature CAD    NO ISCHEMIC SYMPTOMS   Hyperlipidemia    Hypertension    Thyroid nodule     Past Surgical History:  Procedure Laterality Date   CARPAL TUNNEL RELEASE     HAND SURGERY     ROTATOR CUFF REPAIR     URINARY SPHINCTER IMPLANT      There were no vitals filed for this visit.   Subjective Assessment - 11/07/21 1019     Subjective I drove to [place] and back since last here and did so much better.  I broke up the trip to stretch 5 times each way.  The DN and stretching have been so helpful.    Pertinent History PMH: prostate cancer, HLD    Limitations Walking    How long can you sit comfortably? uses lumbar support and tries to limit sitting to 30 min and 2.5 hours when driving    How long can you walk comfortably? 3 miles - tightness in anterior thighs    Patient Stated Goals learn exercises to address lower back and thigh tightness, related to walking, driving and golf    Currently in Pain? Yes    Pain Score 4     Pain Location Back    Pain Orientation Lower;Mid    Pain Descriptors / Indicators  Tightness    Pain Type Chronic pain    Pain Onset More than a month ago    Pain Frequency Intermittent    Aggravating Factors  prolonged sitting                               [facility] Adult PT Treatment/Exercise - 11/07/21 0001       Exercises   Exercises Lumbar;Knee/Hip;Shoulder      Lumbar Exercises: Stretches   Gastroc Stretch 1 rep;20 seconds;Left;Right    Gastroc Stretch Limitations slant board      Lumbar Exercises: Aerobic   Recumbent Bike L3 x 6'  PT present to discuss progress      Lumbar Exercises: Standing   Other Standing Lumbar Exercises bil trunk SB with fists in lumbar spine x 5 each way, then up.over SB reach x 10 sec    Other Standing Lumbar Exercises golf club along upper tspine in bil UEs thoracic rotation x 5 each way      Knee/Hip Exercises: Stretches   Hip Flexor Stretch Left;Right;20 seconds  Hip Flexor Stretch Limitations with dynamic heel raise, arms up in Y on doorframe      Knee/Hip Exercises: Machines for Strengthening   Hip Cybex hip matrix ext 30lb x 10 Rt/Lt      Shoulder Exercises: Standing   Diagonals Limitations green tband bil UE chop and lift x 5 each, Rt/Lt      Modalities   Modalities Moist Heat      Moist Heat Therapy   Number Minutes Moist Heat 10 Minutes    Moist Heat Location Hip      Manual Therapy   Manual Therapy Soft tissue mobilization    Soft tissue mobilization massage roller Lt ITB, VL, STM manual Lt TFL              Trigger Point Dry Needling - 11/07/21 0001     Consent Given? Yes    Education Handout Provided Previously provided    Muscles Treated Lower Quadrant Vastus lateralis    Muscles Treated Back/Hip --    Dry Needling Comments --    Other Dry Needling VL at ITB border mid-length and distal, TFL, Lt only    Vastus lateralis Response Twitch response elicited;Palpable increased muscle length    Lumbar multifidi Response --                     PT Short Term Goals -  10/24/21 1241       PT SHORT TERM GOAL #1   Title be independent in initial HEP    Status On-going      PT SHORT TERM GOAL #2   Title Pt will report improved stiffness following 3 mile walk by at least 25%    Baseline hasn't been walking due to Rt calf injury    Status On-going               PT Long Term Goals - 09/27/21 1047       PT LONG TERM GOAL #1   Title be independent in advanced HEP to reduce or eliminate stiffness with 3 mile walks, golf and within long car trips    Time 8    Period Weeks    Status New    Target Date 11/22/21      PT LONG TERM GOAL #2   Title Improve FOTO score to >/= 72% to demo improved function    Time 8    Period Weeks    Status New    Target Date 11/22/21      PT LONG TERM GOAL #3   Title Pt will report reduced stiffness of back and thighs by at least 70% with and following activity    Time 8    Period Weeks    Status New    Target Date 11/22/21      PT LONG TERM GOAL #4   Title Pt will achieve lumbar and thoracic ROM to [facility] with ease to allow for improved body mechanics for golf and bending tasks    Time 8    Period Weeks    Status New    Target Date 11/22/21      PT LONG TERM GOAL #5   Title -                   Plan - 11/07/21 1109     Clinical Impression Statement Pt with report of successful drive to/from [place] last week without flare up of tightness using stops to stretch x 5  times each way.  Pt did note Lt hip soreness and stiffness > Lt quad or lumbar region.  Pt had limited functional hip IR in closed chain with diag chops/lifts and golf club standing trunk rotation.  PT noted TPs in VL/ITB and soreness along greater trochanter.  DN used followed by STM and massage roller and heat.  Functional IR re-tested and symmetrical motion without soreness was achieved end of session.  Pt playing golf tomorrow and will stretch beforehand using tools from HEP.    Comorbidities Hx of prostate CA    PT Next Visit Plan f/u on  golf and Lt hip/ITB DN from last time, DN lumbar spine, continue standing chops/lifts with green band, golf club trunk rotation, trunk SB stretches, add ITB stretch for Lt hip    PT Home Exercise Plan Access Code: [email or web address]    Consulted and Agree with Plan of Care Patient             Patient will benefit from skilled therapeutic intervention in order to improve the following deficits and impairments:     Visit Diagnosis: Chronic midline low back pain without sciatica  Abnormal posture     Problem List Patient Active Problem List   Diagnosis Date Noted   Educated about COVID-19 virus infection 09/06/2020   Dyslipidemia 09/06/2020   Elevated coronary artery calcium score 09/06/2020   Aortic root enlargement ([place]) 09/06/2020   Nonspecific abnormal electrocardiogram (ECG) (EKG) 08/28/2019   Thoracic aortic aneurysm without rupture 08/28/2019   Baruch Merl, PT 11/07/21 11:19 AM   [facility] @ [place], [place], [address] Phone: [phone]   Fax:  [phone]  Name: Elford Evilsizer MRN: [id number] Date of Birth: December 14, 1940

## 2021-11-14 ENCOUNTER — Encounter: Payer: Self-pay | Admitting: Physical Therapy

## 2021-11-14 ENCOUNTER — Other Ambulatory Visit: Payer: Self-pay

## 2021-11-14 ENCOUNTER — Ambulatory Visit: Payer: Medicare HMO | Admitting: Physical Therapy

## 2021-11-14 DIAGNOSIS — R293 Abnormal posture: Secondary | ICD-10-CM

## 2021-11-14 DIAGNOSIS — G8929 Other chronic pain: Secondary | ICD-10-CM

## 2021-11-14 DIAGNOSIS — M545 Low back pain, unspecified: Secondary | ICD-10-CM | POA: Diagnosis not present

## 2021-11-14 NOTE — Therapy (Signed)
[facility] @ [place], [place], [address] Phone: [phone]   Fax:  [phone]  Physical Therapy Treatment  Patient Details  Name: Miguel Walker MRN: [id number] Date of Birth: 1941/02/03 Referring Provider (PT): London Pepper, MD   Encounter Date: 11/14/2021   PT End of Session - 11/14/21 1014     Visit Number 7    Date for PT Re-Evaluation 11/22/21    Authorization Type A[employer]na Medicare    Progress Note Due on Visit 10    PT Start Time 1015    PT Stop Time 1100    PT Time Calculation (min) 45 min    Activity Tolerance Patient tolerated treatment well    Behavior During Therapy [facility] for tasks assessed/performed             Past Medical History:  Diagnosis Date   Aneurysm, ascending aorta    Cancer ([place]) 1997   Prostate cancer   Dyslipidemia    Family history of premature CAD    NO ISCHEMIC SYMPTOMS   Hyperlipidemia    Hypertension    Thyroid nodule     Past Surgical History:  Procedure Laterality Date   CARPAL TUNNEL RELEASE     HAND SURGERY     ROTATOR CUFF REPAIR     URINARY SPHINCTER IMPLANT      There were no vitals filed for this visit.   Subjective Assessment - 11/14/21 1018     Subjective The low back is still tight and I feel it into my hips.  My Lt thigh has been sore since DN last time.  It still feels tight.    Pertinent History PMH: prostate cancer, HLD    Limitations Walking    How long can you sit comfortably? uses lumbar support and tries to limit sitting to 30 min and 2.5 hours when driving    How long can you walk comfortably? 3 miles - tightness in anterior thighs    Patient Stated Goals learn exercises to address lower back and thigh tightness, related to walking, driving and golf    Currently in Pain? Yes    Pain Score 4     Pain Location Back    Pain Orientation Lower;Mid    Pain Descriptors / Indicators Tightness    Pain Type Chronic pain    Pain Onset More  than a month ago    Pain Frequency Intermittent    Aggravating Factors  prolonged sitting                               [facility] Adult PT Treatment/Exercise - 11/14/21 0001       Exercises   Exercises Lumbar;Knee/Hip;Shoulder      Lumbar Exercises: Aerobic   Nustep L4 x 5' seat 9 arms 10      Knee/Hip Exercises: Stretches   Engineer, building services Limitations passive in SL after STM    Hip Flexor Stretch Left;Right;20 seconds    Hip Flexor Stretch Limitations foot on 3rd step rock in/out    ITB Stretch Left;30 seconds;Right    ITB Stretch Limitations standing    Gastroc Stretch Both;1 rep;20 seconds    Gastroc Stretch Limitations slant board      Knee/Hip Exercises: Standing   Forward Lunges Limitations backward lunges at counter x 5 each with slider    Side Lunges Both;1 set;5 sets  slider at counter   Hip Abduction AROM;Both;10 reps;Knee straight    Hip Extension Stengthening;Both;10 reps;Knee straight    Other Standing Knee Exercises standing T at counter x 10 each leg      Manual Therapy   Manual Therapy Soft tissue mobilization;Joint mobilization    Joint Mobilization UPA Lt L5/S1 Gr II/III    Soft tissue mobilization Lt ITB and VL, gluteals after DN              Trigger Point Dry Needling - 11/14/21 0001     Consent Given? Yes    Education Handout Provided Previously provided    Muscles Treated Back/Hip Gluteus medius;Piriformis    Other Dry Needling Lt    Gluteus Medius Response Twitch response elicited;Palpable increased muscle length    Piriformis Response Twitch response elicited;Palpable increased muscle length                     PT Short Term Goals - 10/24/21 1241       PT SHORT TERM GOAL #1   Title be independent in initial HEP    Status On-going      PT SHORT TERM GOAL #2   Title Pt will report improved stiffness following 3 mile walk by at least 25%    Baseline hasn't been walking due to Rt calf  injury    Status On-going               PT Long Term Goals - 09/27/21 1047       PT LONG TERM GOAL #1   Title be independent in advanced HEP to reduce or eliminate stiffness with 3 mile walks, golf and within long car trips    Time 8    Period Weeks    Status New    Target Date 11/22/21      PT LONG TERM GOAL #2   Title Improve FOTO score to >/= 72% to demo improved function    Time 8    Period Weeks    Status New    Target Date 11/22/21      PT LONG TERM GOAL #3   Title Pt will report reduced stiffness of back and thighs by at least 70% with and following activity    Time 8    Period Weeks    Status New    Target Date 11/22/21      PT LONG TERM GOAL #4   Title Pt will achieve lumbar and thoracic ROM to [facility] with ease to allow for improved body mechanics for golf and bending tasks    Time 8    Period Weeks    Status New    Target Date 11/22/21      PT LONG TERM GOAL #5   Title -                   Plan - 11/14/21 1101     Clinical Impression Statement Pt reports ongoing tightness and soreness in lumbar region and bil hips Lt>Rt.  PT advanced dynamic closed chain and open chain strength and ROM today followed by DN to Lt gluteals and stripping of ITB and VL.  PT notes improved length of Lt quad with passive Lt quad stretch today with smaller sized TP in VL.  Pt is making slow but steady progress toward improved posture and ROM and will continue to benefit from skilled PT.    Comorbidities Hx of prostate CA    PT  Frequency 1x / week    PT Duration 8 weeks    PT Treatment/Interventions ADLs/Self Care Home Management;Electrical Stimulation;Moist Heat;Cryotherapy;Neuromuscular re-education;Balance training;Therapeutic exercise;Therapeutic activities;Functional mobility training;Patient/family education;Manual techniques;Dry needling;Passive range of motion;Taping;Joint Manipulations;Spinal Manipulations    PT Next Visit Plan ERO, progress HEP, continue  open/closed chain hip strength, add ITB stretch and hip strength to HEP, continue manual techniques as needed    PT Home Exercise Plan Access Code: [email or web address]    Consulted and Agree with Plan of Care Patient             Patient will benefit from skilled therapeutic intervention in order to improve the following deficits and impairments:     Visit Diagnosis: Chronic midline low back pain without sciatica  Abnormal posture     Problem List Patient Active Problem List   Diagnosis Date Noted   Educated about COVID-19 virus infection 09/06/2020   Dyslipidemia 09/06/2020   Elevated coronary artery calcium score 09/06/2020   Aortic root enlargement ([place]) 09/06/2020   Nonspecific abnormal electrocardiogram (ECG) (EKG) 08/28/2019   Thoracic aortic aneurysm without rupture 08/28/2019    Baruch Merl, PT 11/14/21 1:36 PM   [facility] @ [place], [place], [address] Phone: [phone]   Fax:  [phone]  Name: Miguel Walker MRN: [id number] Date of Birth: Jan 07, 1941

## 2021-11-22 ENCOUNTER — Ambulatory Visit: Payer: Medicare HMO | Admitting: Physical Therapy

## 2021-11-22 ENCOUNTER — Other Ambulatory Visit: Payer: Self-pay

## 2021-11-22 ENCOUNTER — Encounter: Payer: Self-pay | Admitting: Physical Therapy

## 2021-11-22 DIAGNOSIS — R293 Abnormal posture: Secondary | ICD-10-CM

## 2021-11-22 DIAGNOSIS — M545 Low back pain, unspecified: Secondary | ICD-10-CM | POA: Diagnosis not present

## 2021-11-22 DIAGNOSIS — G8929 Other chronic pain: Secondary | ICD-10-CM

## 2021-11-22 NOTE — Therapy (Signed)
[facility] @ [address] [place], [place], [address] Phone: [phone]   Fax:  [phone]  Physical Therapy Treatment  Patient Details  Name: Miguel Walker MRN: [id number] Date of Birth: 09/15/1941 Referring Provider (PT): London Pepper, MD   Encounter Date: 11/22/2021   PT End of Session - 11/22/21 1021     Visit Number 8    Date for PT Re-Evaluation 01/03/22    Authorization Type [employer]    Progress Note Due on Visit 10    PT Start Time 1017    PT Stop Time 1100    PT Time Calculation (min) 43 min    Activity Tolerance Patient tolerated treatment well    Behavior During Therapy [facility] for tasks assessed/performed             Past Medical History:  Diagnosis Date   Aneurysm, ascending aorta    Cancer ([place]) 1997   Prostate cancer   Dyslipidemia    Family history of premature CAD    NO ISCHEMIC SYMPTOMS   Hyperlipidemia    Hypertension    Thyroid nodule     Past Surgical History:  Procedure Laterality Date   CARPAL TUNNEL RELEASE     HAND SURGERY     ROTATOR CUFF REPAIR     URINARY SPHINCTER IMPLANT      There were no vitals filed for this visit.   Subjective Assessment - 11/22/21 1018     Subjective Pt reports he is still feeling 5/10 of lumbar stiffness and improved Lt thigh tightness to 3/10.  He notes lumbar stiffness when rising from sitting after 30 min and with taking longer walks.    Pertinent History PMH: prostate cancer, HLD    Limitations Walking;Sitting;Standing    How long can you sit comfortably? uses lumbar support and tries to limit sitting to 30 min and 2.5 hours when driving    How long can you walk comfortably? 2-2.5 miles a day    Patient Stated Goals learn exercises to address lower back and thigh tightness, related to walking, driving and golf    Currently in Pain? Yes    Pain Score 5     Pain Location Back    Pain Orientation Lower;Mid    Pain Descriptors /  Indicators Tightness    Pain Type Chronic pain    Pain Radiating Towards bil anterior thighs    Pain Onset More than a month ago    Aggravating Factors  prolonged sitting, longer walks, transition from sitting to standing after sitting 30'                [facility] PT Assessment - 11/22/21 0001       Assessment   Medical Diagnosis M53.80 - Back tightness    Referring Provider (PT) London Pepper, MD    Onset Date/Surgical Date --   01/2021     Observation/Other Assessments   Focus on Therapeutic Outcomes (FOTO)  47%, regressed 20 points   Pt states his adult son with special needs came home and he has had a set back, was better before     Posture/Postural Control   Posture/Postural Control Postural limitations    Postural Limitations Flexed trunk;Forward head;Rounded Shoulders      AROM   Lumbar Flexion full, gastroc stretch bil    Lumbar Extension 10    Lumbar - Right Side Bend 25    Lumbar - Left Side Bend 25    Lumbar -  Right Rotation limited 20%    Lumbar - Left Rotation limited 20%      PROM   Overall PROM Comments bil hip ER improved to WNL, IR 10 deg bil      Strength   Overall Strength Comments LEs 5/5 bil      Flexibility   Hamstrings limited end range only    Quadriceps limited 30% bil    Piriformis limited Lt 20%    Quadratus Lumborum WNL bil      Palpation   Spinal mobility limited L4-S1 Lt>Rt UPAs, limited PAs L4-S1    Palpation comment bil facets L4-S1 Lt>Rt      Ambulation/Gait   Gait Pattern Trunk flexed                           [facility] Adult PT Treatment/Exercise - 11/22/21 0001       Lumbar Exercises: Stretches   Other Lumbar Stretch Exercise golf club diagonal rotations x 8 each way, golf club rotation x 10    Other Lumbar Stretch Exercise standing SB x 10   seated ball rollouts 3x3-way     Lumbar Exercises: Aerobic   Stationary Bike L2 x 4' PT present      Knee/Hip Exercises: Stretches   Sports administrator Both;1 rep;30 seconds     Quad Stretch Limitations standing with strap, verbally reviewed and demo'd by PT      Knee/Hip Exercises: Standing   Hip Abduction AROM;1 set;10 reps;Knee straight    Hip Extension AROM;Both;1 set;10 reps;Knee straight      Manual Therapy   Manual Therapy Soft tissue mobilization    Soft tissue mobilization Lt gluts after DN              Trigger Point Dry Needling - 11/22/21 0001     Consent Given? Yes    Education Handout Provided Previously provided    Muscles Treated Back/Hip Gluteus medius;Piriformis    Other Dry Needling Lt    Gluteus Medius Response Twitch response elicited;Palpable increased muscle length    Piriformis Response Twitch response elicited;Palpable increased muscle length                     PT Short Term Goals - 11/22/21 1022       PT SHORT TERM GOAL #1   Title be independent in initial HEP    Status Achieved      PT SHORT TERM GOAL #2   Title Pt will report improved stiffness following 3 mile walk by at least 25%    Baseline 2 miles, stifness    Status Partially Met               PT Long Term Goals - 11/22/21 1022       PT LONG TERM GOAL #1   Title be independent in advanced HEP to reduce or eliminate stiffness with 3 mile walks, golf and within long car trips    Status On-going      PT LONG TERM GOAL #2   Title Improve FOTO score to >/= 72% to demo improved function    Baseline 47% on 12/21, regressed from 67% baseline    Status On-going      PT LONG TERM GOAL #3   Title Pt will report reduced stiffness of back and thighs by at least 70% with and following activity    Status On-going      PT LONG TERM GOAL #  4   Title Pt will achieve lumbar and thoracic ROM to [facility] with ease to allow for improved body mechanics for golf and bending tasks    Baseline working on thoracic rotation    Status On-going                   Plan - 11/22/21 1356     Clinical Impression Statement Pt is making ongoing gains toward  improved trunk ROM, hip ROM and LE flexibility.  Bil hip IR/ER and hip flexion are now WNL.  He has improved length of bil hamstrings, gluteals and piriformis.  He has ongoing length restrictions in Lt>Rt quads, improved from 50% limited to 30% limited, with reduced size in TP in Lt quad since DN and STM were performed.  Overall trunk ROM has improved in all planes, although he still experiences stiffness after rising from sitting >30 min.  He notes stiffness increases with 2 mile walks and he is worried about a golf tournament in several months which will require a lot of walking.   Pt has had a recent set back due to family demands which have caused him to get away from HEP.  This is likely the reason his FOTO score regressed 20 points today as Pt was making more progress previously.  Pt will benefit from continued PT to address remaining deficits and to continue development of HEP to maximize his mobility and tolerance of daily demands and recreation activitites.    Personal Factors and Comorbidities Age;Profession;Time since onset of injury/illness/exacerbation;Comorbidity 1    Comorbidities Hx of prostate CA    Examination-Activity Limitations Bend;Sit;Locomotion Level    Examination-Participation Restrictions Occupation;Community Activity    Stability/Clinical Decision Making Stable/Uncomplicated    Clinical Decision Making Low    Rehab Potential Excellent    PT Frequency 1x / week    PT Duration 6 weeks    PT Treatment/Interventions ADLs/Self Care Home Management;Electrical Stimulation;Moist Heat;Cryotherapy;Neuromuscular re-education;Balance training;Therapeutic exercise;Therapeutic activities;Functional mobility training;Patient/family education;Manual techniques;Dry needling;Passive range of motion;Taping;Joint Manipulations;Spinal Manipulations    PT Next Visit Plan continue hip and core strength, spinal ROM, LE stretches (quads), lumbar mobs    PT Home Exercise Plan Access Code: [email or web address]     Consulted and Agree with Plan of Care Patient             Patient will benefit from skilled therapeutic intervention in order to improve the following deficits and impairments:  Decreased range of motion, Hypomobility, Impaired flexibility, Postural dysfunction, Improper body mechanics, Pain, Increased muscle spasms, Decreased balance  Visit Diagnosis: Chronic midline low back pain without sciatica - Plan: PT plan of care cert/re-cert  Abnormal posture - Plan: PT plan of care cert/re-cert     Problem List Patient Active Problem List   Diagnosis Date Noted   Educated about COVID-19 virus infection 09/06/2020   Dyslipidemia 09/06/2020   Elevated coronary artery calcium score 09/06/2020   Aortic root enlargement ([place]) 09/06/2020   Nonspecific abnormal electrocardiogram (ECG) (EKG) 08/28/2019   Thoracic aortic aneurysm without rupture 08/28/2019    Baruch Merl, PT 11/22/21 4:08 PM   [facility] @ [address] [place], [place], [address] Phone: [phone]   Fax:  [phone]  Name: Gracen Southwell MRN: [id number] Date of Birth: [birth year]

## 2021-12-06 ENCOUNTER — Other Ambulatory Visit: Payer: Self-pay

## 2021-12-06 ENCOUNTER — Encounter: Payer: Self-pay | Admitting: Physical Therapy

## 2021-12-06 ENCOUNTER — Ambulatory Visit: Payer: Medicare HMO | Attending: Family Medicine | Admitting: Physical Therapy

## 2021-12-06 DIAGNOSIS — M545 Low back pain, unspecified: Secondary | ICD-10-CM | POA: Insufficient documentation

## 2021-12-06 DIAGNOSIS — R293 Abnormal posture: Secondary | ICD-10-CM | POA: Diagnosis present

## 2021-12-06 DIAGNOSIS — G8929 Other chronic pain: Secondary | ICD-10-CM | POA: Insufficient documentation

## 2021-12-06 NOTE — Therapy (Signed)
[facility] @ [place], [place], [address] Phone: [phone]   Fax:  [phone]  Physical Therapy Treatment  Patient Details  Name: Miguel Walker MRN: [id number] Date of Birth: 01-Apr-1941 Referring Provider (PT): London Pepper, MD   Encounter Date: 12/06/2021   PT End of Session - 12/06/21 1017     Visit Number 9    Date for PT Re-Evaluation 01/03/22    Authorization Type [employer] Medicare    Progress Note Due on Visit 10    PT Start Time 1017    PT Stop Time 1100    PT Time Calculation (min) 43 min    Activity Tolerance Patient tolerated treatment well    Behavior During Therapy [facility] for tasks assessed/performed             Past Medical History:  Diagnosis Date   Aneurysm, ascending aorta    Cancer ([place]) 1997   Prostate cancer   Dyslipidemia    Family history of premature CAD    NO ISCHEMIC SYMPTOMS   Hyperlipidemia    Hypertension    Thyroid nodule     Past Surgical History:  Procedure Laterality Date   CARPAL TUNNEL RELEASE     HAND SURGERY     ROTATOR CUFF REPAIR     URINARY SPHINCTER IMPLANT      There were no vitals filed for this visit.   Subjective Assessment - 12/06/21 1016     Subjective Company leaves tomorrow so I'll have more time to focus on my HEP again then.  My thigh is so much better but my low back is still very tight.    Pertinent History PMH: prostate cancer, HLD    Limitations Walking;Sitting;Standing    How long can you sit comfortably? uses lumbar support and tries to limit sitting to 30 min and 2.5 hours when driving    How long can you walk comfortably? 2-2.5 miles a day    Patient Stated Goals learn exercises to address lower back and thigh tightness, related to walking, driving and golf    Currently in Pain? Yes    Pain Score 6     Pain Location Back    Pain Orientation Lower;Mid    Pain Descriptors / Indicators Tightness    Pain Type Chronic pain    Pain  Onset More than a month ago    Aggravating Factors  prolonged sitting, standing after sitting                               [facility] Adult PT Treatment/Exercise - 12/06/21 0001       Exercises   Exercises Shoulder;Lumbar;Knee/Hip      Lumbar Exercises: Aerobic   Recumbent Bike L3 x 3' PT present to discuss plan for session      Lumbar Exercises: Standing   Other Standing Lumbar Exercises trunk SB x 10 A/ROM    Other Standing Lumbar Exercises standing mod deadlifts 5lb kbell      Lumbar Exercises: Quadruped   Madcat/Old Horse 10 reps    Other Quadruped Lumbar Exercises child's pose 3-way 2x5"      Shoulder Exercises: Standing   Extension Strengthening;10 reps;Both;Theraband    Theraband Level (Shoulder Extension) Level 3 (Green)    Row Strengthening;Both;Theraband;10 reps    Theraband Level (Shoulder Row) Level 4 (Blue)    Row Limitations bil, Rt with Rt Rot, Lt with Lt  Rot x 10 cycles      Shoulder Exercises: ROM/Strengthening   Wall Pushups 5 reps      Manual Therapy   Manual Therapy Soft tissue mobilization;Manual Traction    Soft tissue mobilization lumbar skin rolling, lumbar paraspinals and fascia    Manual Traction prone sacral distraction Gr II/III              Trigger Point Dry Needling - 12/06/21 0001     Consent Given? Yes    Education Handout Provided Previously provided    Muscles Treated Back/Hip Lumbar multifidi    Other Dry Needling Lt L5/S1 only    Lumbar multifidi Response Palpable increased muscle length                     PT Short Term Goals - 11/22/21 1022       PT SHORT TERM GOAL #1   Title be independent in initial HEP    Status Achieved      PT SHORT TERM GOAL #2   Title Pt will report improved stiffness following 3 mile walk by at least 25%    Baseline 2 miles, stifness    Status Partially Met               PT Long Term Goals - 11/22/21 1022       PT LONG TERM GOAL #1   Title be  independent in advanced HEP to reduce or eliminate stiffness with 3 mile walks, golf and within long car trips    Status On-going      PT LONG TERM GOAL #2   Title Improve FOTO score to >/= 72% to demo improved function    Baseline 47% on 12/21, regressed from 67% baseline    Status On-going      PT LONG TERM GOAL #3   Title Pt will report reduced stiffness of back and thighs by at least 70% with and following activity    Status On-going      PT LONG TERM GOAL #4   Title Pt will achieve lumbar and thoracic ROM to [facility] with ease to allow for improved body mechanics for golf and bending tasks    Baseline working on thoracic rotation    Status On-going                   Plan - 12/06/21 1102     Clinical Impression Statement Pt arrived with ongoing lumbar stiffness 6/10.  He has increased lumbar pain with walking 3 miles.  He was better able to tolerate and perform coordinated spinal ROM including cat/cow and child's pose 3-way followed by core and postural strength which then yielded Pt report of reduced pain and stiffness.  He was better able to stand upright more comfortably following manual therapy end of session.  PT progressed HEP and ther ex for blue band row bil and single with rot, wall push up, and hip ext hands on wall today with good tolerance.  He needed cues to keep shoulders broad and TA engaged with modified dead lift today.  Excellent response to spine ROM, strength and manual therapy combo today.  Continue along POC.    Comorbidities Hx of prostate CA    PT Frequency 1x / week    PT Duration 6 weeks    PT Treatment/Interventions ADLs/Self Care Home Management;Electrical Stimulation;Moist Heat;Cryotherapy;Neuromuscular re-education;Balance training;Therapeutic exercise;Therapeutic activities;Functional mobility training;Patient/family education;Manual techniques;Dry needling;Passive range of motion;Taping;Joint Manipulations;Spinal Manipulations    PT  Next Visit Plan  continue spine ROM for lumbar spine (cat/cow and 3-way child's pose) and trunk rot, wall push ups, hip ext in wall plank with LE tripod, blue band row and row with rot, manual therapy with STM and skin rolling lumbar    PT Home Exercise Plan Access Code: [email or web address]    Consulted and Agree with Plan of Care Patient             Patient will benefit from skilled therapeutic intervention in order to improve the following deficits and impairments:     Visit Diagnosis: Chronic midline low back pain without sciatica  Abnormal posture     Problem List Patient Active Problem List   Diagnosis Date Noted   Educated about COVID-19 virus infection 09/06/2020   Dyslipidemia 09/06/2020   Elevated coronary artery calcium score 09/06/2020   Aortic root enlargement ([place]) 09/06/2020   Nonspecific abnormal electrocardiogram (ECG) (EKG) 08/28/2019   Thoracic aortic aneurysm without rupture 08/28/2019   Baruch Merl, PT 12/06/21 11:26 AM   [facility] @ [place], [place], [address] Phone: [phone]   Fax:  [phone]  Name: Miguel Walker MRN: [id number] Date of Birth: Jun 24, 1941

## 2021-12-13 ENCOUNTER — Encounter: Payer: Self-pay | Admitting: Physical Therapy

## 2021-12-13 ENCOUNTER — Ambulatory Visit: Payer: Medicare HMO | Admitting: Physical Therapy

## 2021-12-13 ENCOUNTER — Other Ambulatory Visit: Payer: Self-pay

## 2021-12-13 DIAGNOSIS — G8929 Other chronic pain: Secondary | ICD-10-CM

## 2021-12-13 DIAGNOSIS — M545 Low back pain, unspecified: Secondary | ICD-10-CM

## 2021-12-13 DIAGNOSIS — R293 Abnormal posture: Secondary | ICD-10-CM

## 2021-12-13 NOTE — Therapy (Signed)
[facility] @ [place] [place], [place], [address] Phone: [phone]   Fax:  [phone]  Physical Therapy Treatment  Patient Details  Name: Miguel Walker MRN: [id number] Date of Birth: 02/06/1941 Referring Provider (PT): London Pepper, MD  Progress Note Reporting Period 09/27/22 to 12/13/21  See note below for Objective Data and Assessment of Progress/Goals.     Encounter Date: 12/13/2021   PT End of Session - 12/13/21 1019     Visit Number 10    Date for PT Re-Evaluation 01/03/22    Authorization Type [employer]na Medicare    Progress Note Due on Visit 42    PT Start Time 1019    PT Stop Time 1101    PT Time Calculation (min) 42 min    Activity Tolerance Patient tolerated treatment well    Behavior During Therapy [facility] for tasks assessed/performed             Past Medical History:  Diagnosis Date   Aneurysm, ascending aorta    Cancer ([place]) 1997   Prostate cancer   Dyslipidemia    Family history of premature CAD    NO ISCHEMIC SYMPTOMS   Hyperlipidemia    Hypertension    Thyroid nodule     Past Surgical History:  Procedure Laterality Date   CARPAL TUNNEL RELEASE     HAND SURGERY     ROTATOR CUFF REPAIR     URINARY SPHINCTER IMPLANT      There were no vitals filed for this visit.   Subjective Assessment - 12/13/21 1019     Subjective I pulled my Rt calf again when going down the stairs too fast.  I did play golf yesterday and did 12,000 steps.  It was slightly better having done the stretches on the course with the golf club.    Pertinent History PMH: prostate cancer, HLD    How long can you sit comfortably? uses lumbar support and tries to limit sitting to 30 min and 2.5 hours when driving    How long can you walk comfortably? 2-2.5 miles a day    Patient Stated Goals learn exercises to address lower back and thigh tightness, related to walking, driving and golf    Currently in Pain? Yes    Pain  Score 5     Pain Location Back    Pain Orientation Lower;Mid    Pain Descriptors / Indicators Tightness    Pain Type Chronic pain    Pain Radiating Towards on waking in AM, longer walking    Pain Onset More than a month ago    Pain Frequency Intermittent                [facility] PT Assessment - 12/13/21 0001       Assessment   Medical Diagnosis M53.80 - Back tightness    Referring Provider (PT) London Pepper, MD    Onset Date/Surgical Date --   01/2021     Posture/Postural Control   Posture/Postural Control Postural limitations    Postural Limitations Flexed trunk;Forward head;Rounded Shoulders    Posture Comments able to correct posture with TC to extend through mid-thoracic spine and retract neck      AROM   Lumbar Flexion full, gastroc stretch bil    Lumbar Extension 10    Lumbar - Right Side Bend 25    Lumbar - Left Side Bend 25    Lumbar - Right Rotation limited 20%    Lumbar -  Left Rotation limited 20%      PROM   Overall PROM Comments bil hip ER improved to WNL, IR 10 deg bil      Strength   Overall Strength Comments LEs 5/5 bil      Flexibility   Quadriceps limited 20% bil      Palpation   Palpation comment improving lumar fascial mobility, skin rolling improved throughout lumbar spine                           [facility] Adult PT Treatment/Exercise - 12/13/21 0001       Exercises   Exercises Shoulder;Lumbar;Knee/Hip      Lumbar Exercises: Stretches   Double Knee to Chest Stretch 30 seconds    Double Knee to Chest Stretch Limitations foam roller under pelvis (soft)    Other Lumbar Stretch Exercise foam roller Ys, Ws, Ts x 5 rounds (soft)      Lumbar Exercises: Aerobic   Recumbent Bike L3 x 3' PT present to discuss plan for session      Lumbar Exercises: Standing   Other Standing Lumbar Exercises trunk SB x 10 A/ROM      Lumbar Exercises: Quadruped   Madcat/Old Horse 10 reps   VC and TC for technique   Other Quadruped Lumbar Exercises  child's pose 3-way 3x5"      Knee/Hip Exercises: Stretches   Hip Flexor Stretch Left;Right;3 reps;30 seconds    Hip Flexor Stretch Limitations supine foam roller under pelvis SKTC contralateral LE      Shoulder Exercises: Supine   Horizontal ABduction Strengthening;Both;5 reps;Theraband    Theraband Level (Shoulder Horizontal ABduction) Level 4 (Blue)    Horizontal ABduction Limitations laying along soft foam roller      Shoulder Exercises: Standing   Row Strengthening;Both;Theraband    Theraband Level (Shoulder Row) Level 4 (Blue)    Row Limitations bil, Rt with Rt Rot, Lt with Lt Rot x 10 cycles      Manual Therapy   Manual Therapy Soft tissue mobilization    Soft tissue mobilization lumbar skin rolling, lumbar paraspinals and fascia    Manual Traction prone sacral distraction Gr II/III                       PT Short Term Goals - 11/22/21 1022       PT SHORT TERM GOAL #1   Title be independent in initial HEP    Status Achieved      PT SHORT TERM GOAL #2   Title Pt will report improved stiffness following 3 mile walk by at least 25%    Baseline 2 miles, stifness    Status Partially Met               PT Long Term Goals - 11/22/21 1022       PT LONG TERM GOAL #1   Title be independent in advanced HEP to reduce or eliminate stiffness with 3 mile walks, golf and within long car trips    Status On-going      PT LONG TERM GOAL #2   Title Improve FOTO score to >/= 72% to demo improved function    Baseline 47% on 12/21, regressed from 67% baseline    Status On-going      PT LONG TERM GOAL #3   Title Pt will report reduced stiffness of back and thighs by at least 70% with and following activity  Status On-going      PT LONG TERM GOAL #4   Title Pt will achieve lumbar and thoracic ROM to [facility] with ease to allow for improved body mechanics for golf and bending tasks    Baseline working on thoracic rotation    Status On-going                    Plan - 12/13/21 1255     Clinical Impression Statement Pt reports tightness in Lt quad has resolved.  He continues to have stiffness in lumbar spine which worsens with prolonged walking or rising after prolonged sitting.  LE flexibility, hip ROM, and myofascial mobility throughout lumbar spine is improving.  Pt is working on postural strength to allow for more upright posture from flexed trunk tendency and correction of thoracic kyphosis and rounded shoulders.  PT progressed deadlifts today and introduced foam roller into LE stretches and thoracic mobility.  Pt has been able to return to golf and covered 12,000 steps with moderate difficulty yesterday.  Pt will continue to benefit from skilled PT to continue mobility and strength to maximize function with less pain.    Comorbidities Hx of prostate CA    PT Frequency 1x / week    PT Duration 6 weeks    PT Treatment/Interventions ADLs/Self Care Home Management;Electrical Stimulation;Moist Heat;Cryotherapy;Neuromuscular re-education;Balance training;Therapeutic exercise;Therapeutic activities;Functional mobility training;Patient/family education;Manual techniques;Dry needling;Passive range of motion;Taping;Joint Manipulations;Spinal Manipulations    PT Next Visit Plan continue spine ROM for lumbar spine (cat/cow and 3-way child's pose) and trunk rot, wall push ups, hip ext in wall plank with LE tripod, blue band row and row with rot, manual therapy with STM and skin rolling lumbar    PT Home Exercise Plan Access Code: [email or web address]    Consulted and Agree with Plan of Care Patient             Patient will benefit from skilled therapeutic intervention in order to improve the following deficits and impairments:     Visit Diagnosis: Chronic midline low back pain without sciatica  Abnormal posture     Problem List Patient Active Problem List   Diagnosis Date Noted   Educated about COVID-19 virus infection 09/06/2020   Dyslipidemia  09/06/2020   Elevated coronary artery calcium score 09/06/2020   Aortic root enlargement ([place]) 09/06/2020   Nonspecific abnormal electrocardiogram (ECG) (EKG) 08/28/2019   Thoracic aortic aneurysm without rupture 08/28/2019    Baruch Merl, PT 12/13/21 1:01 PM   [facility] @ [place], [place], [address] Phone: [phone]   Fax:  [phone]  Name: Miguel Walker MRN: [id number] Date of Birth: June 23, 1941

## 2021-12-20 ENCOUNTER — Encounter: Payer: Self-pay | Admitting: Physical Therapy

## 2021-12-20 ENCOUNTER — Ambulatory Visit: Payer: Medicare HMO | Admitting: Physical Therapy

## 2021-12-20 ENCOUNTER — Other Ambulatory Visit: Payer: Self-pay

## 2021-12-20 DIAGNOSIS — M545 Low back pain, unspecified: Secondary | ICD-10-CM | POA: Diagnosis not present

## 2021-12-20 DIAGNOSIS — R293 Abnormal posture: Secondary | ICD-10-CM

## 2021-12-20 DIAGNOSIS — G8929 Other chronic pain: Secondary | ICD-10-CM

## 2021-12-20 NOTE — Therapy (Signed)
[facility] @ [address] [place], [place], [address] Phone: [phone]   Fax:  [phone]  Physical Therapy Treatment  Patient Details  Name: Miguel Walker MRN: [id number] Date of Birth: Jan 05, 1941 Referring Provider (PT): London Pepper, MD   Encounter Date: 12/20/2021   PT End of Session - 12/20/21 1019     Visit Number 11    Date for PT Re-Evaluation 01/03/22    Authorization Type [employer] Medicare    Progress Note Due on Visit 20    PT Start Time 1015    PT Stop Time 1100    PT Time Calculation (min) 45 min    Activity Tolerance Patient tolerated treatment well    Behavior During Therapy [facility] for tasks assessed/performed             Past Medical History:  Diagnosis Date   Aneurysm, ascending aorta    Cancer ([place]) 1997   Prostate cancer   Dyslipidemia    Family history of premature CAD    NO ISCHEMIC SYMPTOMS   Hyperlipidemia    Hypertension    Thyroid nodule     Past Surgical History:  Procedure Laterality Date   CARPAL TUNNEL RELEASE     HAND SURGERY     ROTATOR CUFF REPAIR     URINARY SPHINCTER IMPLANT      There were no vitals filed for this visit.   Subjective Assessment - 12/20/21 1018     Subjective What a difference it makes to be more aware of needing to stand more upright.  My pain is down to a 4/10.  Foam roller came in the mail but I need to review how to use it.  I also use a plug in heat/massager 3x/daily x 15'.    Pertinent History PMH: prostate cancer, HLD    Limitations Walking;Sitting;Standing    How long can you sit comfortably? uses lumbar support and tries to limit sitting to 30 min and 2.5 hours when driving    How long can you walk comfortably? 2-2.5 miles a day    Patient Stated Goals learn exercises to address lower back and thigh tightness, related to walking, driving and golf    Currently in Pain? Yes    Pain Score 4     Pain Location Back    Pain Orientation  Lower;Mid    Pain Descriptors / Indicators Tightness    Pain Type Chronic pain    Pain Radiating Towards longer walking    Pain Onset More than a month ago    Pain Frequency Intermittent                               [facility] Adult PT Treatment/Exercise - 12/20/21 0001       Exercises   Exercises Shoulder;Lumbar;Knee/Hip      Lumbar Exercises: Stretches   Double Knee to Chest Stretch 30 seconds    Double Knee to Chest Stretch Limitations foam roller under pelvis (soft)      Lumbar Exercises: Aerobic   Recumbent Bike L6 x 5' PT present to discuss progress      Lumbar Exercises: Standing   Row Strengthening;Both;10 reps;Theraband    Theraband Level (Row) Level 4 (Blue)    Row Limitations bil, single with ipsilateral rot each Rt/Lt, 10 cycles    Other Standing Lumbar Exercises diagonal chop double blue tband x 10 each Rt/Lt  Knee/Hip Exercises: Stretches   Hip Flexor Stretch Left;Right;3 reps;10 seconds    Hip Flexor Stretch Limitations supine soft foam roller under pelvis SKTC contralateral LE    Other Knee/Hip Stretches butterfly with OP soft foam roller under lumbar spine      Knee/Hip Exercises: Standing   Walking with Sports Cord 15lb pulleys bwd walking x 5 rounds    Other Standing Knee Exercises deadlifts 15lb x 8 reps, heavy TC/VC for form and alignment    Other Standing Knee Exercises single leg T at counter x 5 each side 5lb kbell      Shoulder Exercises: Supine   Horizontal ABduction Strengthening;Both;5 reps;Theraband    Theraband Level (Shoulder Horizontal ABduction) Level 4 (Blue)    Horizontal ABduction Limitations laying along soft foam roller      Shoulder Exercises: ROM/Strengthening   Other ROM/Strengthening Exercises supine along foam roller Y/W/T x 3 rounds      Manual Therapy   Manual Therapy Soft tissue mobilization    Soft tissue mobilization lumbar skin rolling, lumbar paraspinals and fascia                        PT Short Term Goals - 11/22/21 1022       PT SHORT TERM GOAL #1   Title be independent in initial HEP    Status Achieved      PT SHORT TERM GOAL #2   Title Pt will report improved stiffness following 3 mile walk by at least 25%    Baseline 2 miles, stifness    Status Partially Met               PT Long Term Goals - 11/22/21 1022       PT LONG TERM GOAL #1   Title be independent in advanced HEP to reduce or eliminate stiffness with 3 mile walks, golf and within long car trips    Status On-going      PT LONG TERM GOAL #2   Title Improve FOTO score to >/= 72% to demo improved function    Baseline 47% on 12/21, regressed from 67% baseline    Status On-going      PT LONG TERM GOAL #3   Title Pt will report reduced stiffness of back and thighs by at least 70% with and following activity    Status On-going      PT LONG TERM GOAL #4   Title Pt will achieve lumbar and thoracic ROM to [facility] with ease to allow for improved body mechanics for golf and bending tasks    Baseline working on thoracic rotation    Status On-going                   Plan - 12/20/21 1324     Clinical Impression Statement Pt with much more postural awareness for correction of flexed trunk, thoracic kyphosis and forward head.  He states his pain is now consistently reduced to 4/10 and feels "a world of difference" with postural corrections.  He is making progress towards improved trunk and core mobility and strength.  LE flexibility and hip ROM are nearly WNL.  Lumbar fascial and skin mobility is now WNL.  Pt continues to focus on postural and functional strength under load with need for technique demo and cueing, today especially with RDLs.  Continue along POC.    PT Frequency 1x / week    PT Duration 6 weeks    PT  Treatment/Interventions ADLs/Self Care Home Management;Electrical Stimulation;Moist Heat;Cryotherapy;Neuromuscular re-education;Balance training;Therapeutic  exercise;Therapeutic activities;Functional mobility training;Patient/family education;Manual techniques;Dry needling;Passive range of motion;Taping;Joint Manipulations;Spinal Manipulations    PT Next Visit Plan continue RDLs, resisted walking, row w/ rot, chops double blue band    PT Home Exercise Plan Access Code: [email or web address]    Consulted and Agree with Plan of Care Patient             Patient will benefit from skilled therapeutic intervention in order to improve the following deficits and impairments:     Visit Diagnosis: Chronic midline low back pain without sciatica  Abnormal posture     Problem List Patient Active Problem List   Diagnosis Date Noted   Educated about COVID-19 virus infection 09/06/2020   Dyslipidemia 09/06/2020   Elevated coronary artery calcium score 09/06/2020   Aortic root enlargement ([place]) 09/06/2020   Nonspecific abnormal electrocardiogram (ECG) (EKG) 08/28/2019   Thoracic aortic aneurysm without rupture 08/28/2019    Baruch Merl, PT 12/20/21 1:27 PM   [facility] @ [place], [place], [address] Phone: [phone]   Fax:  [phone]  Name: Miguel Walker MRN: [id number] Date of Birth: 1941/08/28

## 2021-12-26 ENCOUNTER — Encounter: Payer: Self-pay | Admitting: Physical Therapy

## 2021-12-26 ENCOUNTER — Other Ambulatory Visit: Payer: Self-pay

## 2021-12-26 ENCOUNTER — Ambulatory Visit: Payer: Medicare HMO | Admitting: Physical Therapy

## 2021-12-26 DIAGNOSIS — R293 Abnormal posture: Secondary | ICD-10-CM

## 2021-12-26 DIAGNOSIS — G8929 Other chronic pain: Secondary | ICD-10-CM

## 2021-12-26 DIAGNOSIS — M545 Low back pain, unspecified: Secondary | ICD-10-CM | POA: Diagnosis not present

## 2021-12-26 NOTE — Therapy (Signed)
[facility] @ [place] [address] [place], [place], [address] Phone: [phone]   Fax:  [phone]  Physical Therapy Treatment  Patient Details  Name: Miguel Walker MRN: [id number] Date of Birth: 1941/09/29 Referring Provider (PT): London Pepper, MD   Encounter Date: 12/26/2021   PT End of Session - 12/26/21 1020     Visit Number 12    Date for PT Re-Evaluation 01/03/22    Authorization Type [employer] Medicare    Progress Note Due on Visit 20    PT Start Time 1015    PT Stop Time 1059    PT Time Calculation (min) 44 min    Activity Tolerance Patient tolerated treatment well    Behavior During Therapy [facility] for tasks assessed/performed             Past Medical History:  Diagnosis Date   Aneurysm, ascending aorta    Cancer ([place]) 1997   Prostate cancer   Dyslipidemia    Family history of premature CAD    NO ISCHEMIC SYMPTOMS   Hyperlipidemia    Hypertension    Thyroid nodule     Past Surgical History:  Procedure Laterality Date   CARPAL TUNNEL RELEASE     HAND SURGERY     ROTATOR CUFF REPAIR     URINARY SPHINCTER IMPLANT      There were no vitals filed for this visit.   Subjective Assessment - 12/26/21 1018     Subjective I continue to notice improvements in feeling more upright in my posture, I can walk faster, my back isn't as tight, and I felt great playing golf last week.  My pain is less, maybe a 3-4/10 instead of a 4-5/10 that is has been in past.    Pertinent History PMH: prostate cancer, HLD    Limitations Walking;Sitting;Standing    How long can you sit comfortably? uses lumbar support and tries to limit sitting to 30 min and 2.5 hours when driving    How long can you walk comfortably? 2-2.5 miles a day    Patient Stated Goals learn exercises to address lower back and thigh tightness, related to walking, driving and golf    Currently in Pain? Yes    Pain Score 3     Pain Location Back    Pain  Orientation Lower;Mid    Pain Descriptors / Indicators Tightness                               [facility] Adult PT Treatment/Exercise - 12/26/21 0001       Exercises   Exercises Shoulder;Lumbar;Knee/Hip      Lumbar Exercises: Stretches   Active Hamstring Stretch Left;Right;1 rep;30 seconds    Active Hamstring Stretch Limitations seated    Single Knee to Chest Stretch Left;Right;3 reps;10 seconds    Single Knee to Chest Stretch Limitations with foam roller under pelvis opp leg extended for hip flexor stretch    Other Lumbar Stretch Exercise open book 3x5" bil      Lumbar Exercises: Aerobic   Recumbent Bike L3 x 5' PT present to discuss progress      Lumbar Exercises: Standing   Row Strengthening;Both;10 reps;Theraband    Theraband Level (Row) Level 4 (Blue)    Row Limitations bil, single arm with rot, x 5 cycles    Other Standing Lumbar Exercises diagonal chop double blue tband x 10 each Rt/Lt  Lumbar Exercises: Supine   Bridge 10 reps      Knee/Hip Exercises: Machines for Strengthening   Hip Cybex 30lb hip abd/ext x 5 each, Rt/Lt      Knee/Hip Exercises: Standing   Walking with Sports Cord 15lb pulleys bwd walking x 8 rounds    Other Standing Knee Exercises RDLs 15lb kbell x 10, much improved body mechanics today    Other Standing Knee Exercises single leg T at counter x 5 each side 10lb kbell   improved hamstring length allowing greater excursion     Manual Therapy   Manual Therapy Soft tissue mobilization    Soft tissue mobilization lumbar skin rolling, lumbar paraspinals and fascia                       PT Short Term Goals - 11/22/21 1022       PT SHORT TERM GOAL #1   Title be independent in initial HEP    Status Achieved      PT SHORT TERM GOAL #2   Title Pt will report improved stiffness following 3 mile walk by at least 25%    Baseline 2 miles, stifness    Status Partially Met               PT Long Term Goals -  11/22/21 1022       PT LONG TERM GOAL #1   Title be independent in advanced HEP to reduce or eliminate stiffness with 3 mile walks, golf and within long car trips    Status On-going      PT LONG TERM GOAL #2   Title Improve FOTO score to >/= 72% to demo improved function    Baseline 47% on 12/21, regressed from 67% baseline    Status On-going      PT LONG TERM GOAL #3   Title Pt will report reduced stiffness of back and thighs by at least 70% with and following activity    Status On-going      PT LONG TERM GOAL #4   Title Pt will achieve lumbar and thoracic ROM to [facility] with ease to allow for improved body mechanics for golf and bending tasks    Baseline working on thoracic rotation    Status On-going                   Plan - 12/26/21 1059     Clinical Impression Statement Pt continues to note improvements in pain and function over last 2 weeks.  He reports he was able to golf without pain last week for first time.  He is standing with more erect posture and reports his walking pace is faster and painfree.  PT notes hamstring length has improved allowing greater excursion of standing T with kbell today.  Pt needed little to no cueing with RDLs with 15lb kbell today demo'ing much improved upper back and scapular stabilization and alignment.  Lumbar soft tissue mobility remains improved.  Pt is very compliant with HEP and is using his soft foam roller for additional stretching and mobilization.  He is also using a chair heating pad with massage rollers 3x/day x 10 min which gives great relief.  ERO next time with plan to taper to 2x/month to monitor Pt as he is faced with more driving to [place] over next 2 months which have exacerbated pain in past.    Comorbidities Hx of prostate CA    PT Frequency 1x /  week    PT Duration 6 weeks    PT Treatment/Interventions ADLs/Self Care Home Management;Electrical Stimulation;Moist Heat;Cryotherapy;Neuromuscular re-education;Balance  training;Therapeutic exercise;Therapeutic activities;Functional mobility training;Patient/family education;Manual techniques;Dry needling;Passive range of motion;Taping;Joint Manipulations;Spinal Manipulations    PT Next Visit Plan ERO next time with plan to taper to 2x/month    PT Home Exercise Plan Access Code: [email or web address]    Consulted and Agree with Plan of Care Patient             Patient will benefit from skilled therapeutic intervention in order to improve the following deficits and impairments:     Visit Diagnosis: Chronic midline low back pain without sciatica  Abnormal posture     Problem List Patient Active Problem List   Diagnosis Date Noted   Educated about COVID-19 virus infection 09/06/2020   Dyslipidemia 09/06/2020   Elevated coronary artery calcium score 09/06/2020   Aortic root enlargement ([place]) 09/06/2020   Nonspecific abnormal electrocardiogram (ECG) (EKG) 08/28/2019   Thoracic aortic aneurysm without rupture 08/28/2019    Baruch Merl, PT 12/26/21 12:26 PM   [facility] @ [place], [place], [address] Phone: [phone]   Fax:  [phone]  Name: Miguel Walker MRN: [id number] Date of Birth: 08-11-1941

## 2022-01-02 ENCOUNTER — Ambulatory Visit: Payer: Medicare HMO | Admitting: Physical Therapy

## 2022-01-02 ENCOUNTER — Other Ambulatory Visit: Payer: Self-pay

## 2022-01-02 ENCOUNTER — Encounter: Payer: Self-pay | Admitting: Physical Therapy

## 2022-01-02 DIAGNOSIS — R293 Abnormal posture: Secondary | ICD-10-CM

## 2022-01-02 DIAGNOSIS — M545 Low back pain, unspecified: Secondary | ICD-10-CM | POA: Diagnosis not present

## 2022-01-02 NOTE — Therapy (Signed)
[facility] @ [place], [place], [address] Phone: [phone]   Fax:  [phone]  Physical Therapy Treatment  Patient Details  Name: Miguel Walker MRN: [id number] Date of Birth: 04/21/1941 Referring Provider (PT): London Pepper, MD   Encounter Date: 01/02/2022   PT End of Session - 01/02/22 1015     Visit Number 13    Date for PT Re-Evaluation 02/27/22    Authorization Type [employer]na Medicare    Progress Note Due on Visit 20    PT Start Time 1016    PT Stop Time 1100    PT Time Calculation (min) 44 min    Activity Tolerance Patient tolerated treatment well    Behavior During Therapy [facility] for tasks assessed/performed             Past Medical History:  Diagnosis Date   Aneurysm, ascending aorta    Cancer ([place]) 1997   Prostate cancer   Dyslipidemia    Family history of premature CAD    NO ISCHEMIC SYMPTOMS   Hyperlipidemia    Hypertension    Thyroid nodule     Past Surgical History:  Procedure Laterality Date   CARPAL TUNNEL RELEASE     HAND SURGERY     ROTATOR CUFF REPAIR     URINARY SPHINCTER IMPLANT      There were no vitals filed for this visit.   Subjective Assessment - 01/02/22 1015     Subjective I feel 50% better since starting PT.  I have to work at the back stiffness consistently using stretches, heat/massager x 3 times daily.  I travel to [place] twice in the next month and will use all my strategies I've learned to try to stay loose.    Pertinent History PMH: prostate cancer, HLD    Limitations Walking;Sitting;Standing    How long can you sit comfortably? uses lumbar support and tries to limit sitting to 30 min and 2.5 hours when driving    How long can you walk comfortably? 2-2.5 miles a day    Patient Stated Goals learn exercises to address lower back and thigh tightness, related to walking, driving and golf    Currently in Pain? Yes    Pain Score 4     Pain Location Back    Pain  Orientation Lower;Mid    Pain Descriptors / Indicators Tightness    Pain Type Chronic pain    Pain Radiating Towards longer walking    Pain Onset More than a month ago    Pain Frequency Intermittent    Pain Relieving Factors stretching, heat, exercises, foam roller    Effect of Pain on Daily Activities has to drive to [place] for work intermittently which can increase pain/stiffness                [facility] PT Assessment - 01/02/22 0001       Assessment   Medical Diagnosis M53.80 - Back tightness    Referring Provider (PT) London Pepper, MD    Onset Date/Surgical Date --   01/2021     Observation/Other Assessments   Focus on Therapeutic Outcomes (FOTO)  53%      Posture/Postural Control   Posture/Postural Control Postural limitations    Postural Limitations Forward head;Flexed trunk    Posture Comments able to correct posture with TC to extend through mid-thoracic spine and retract neck      AROM   Overall AROM Comments trunk ROM WNL and painfree  all planes, much improved thoracic rot, ext and SB bil from initial visit      PROM   Overall PROM Comments IR 10 deg bil, pain free      Strength   Overall Strength Comments LEs 5/5 bil      Flexibility   Hamstrings limited end range only    Quadriceps end range limited 10 deg bil    Piriformis WNL    Quadratus Lumborum WNL      Palpation   Palpation comment improving lumbar fascial mobility, skin rolling improved throughout lumbar spine                           [facility] Adult PT Treatment/Exercise - 01/02/22 0001       Exercises   Exercises Shoulder;Lumbar;Knee/Hip      Lumbar Exercises: Stretches   Single Knee to Chest Stretch Left;Right;3 reps;10 seconds    Single Knee to Chest Stretch Limitations with foam roller under pelvis opp leg extended for hip flexor stretch    Double Knee to Chest Stretch 1 rep;30 seconds    Double Knee to Chest Stretch Limitations soft foam roller under pelvis    Other Lumbar  Stretch Exercise Y/W/T on soft foam roller x 5 rounds    Other Lumbar Stretch Exercise childs pose 3-way x 5" 3 rounds      Lumbar Exercises: Aerobic   Recumbent Bike L3 x 5' PT present to discuss progress      Lumbar Exercises: Standing   Other Standing Lumbar Exercises RDL 15lb kbell x 12 reps, need for VC to avoid squat vs dead lift technique      Lumbar Exercises: Supine   Bridge 10 reps      Knee/Hip Exercises: Standing   Forward Step Up Both;1 set;20 reps;Hand Hold: 2;Step Height: 6"    Walking with Sports Cord 15lb pulleys bwd walking x 8 rounds    Other Standing Knee Exercises single leg T at counter x 5 each side 10lb kbell   improved hamstring length allowing greater excursion                      PT Short Term Goals - 11/22/21 1022       PT SHORT TERM GOAL #1   Title be independent in initial HEP    Status Achieved      PT SHORT TERM GOAL #2   Title Pt will report improved stiffness following 3 mile walk by at least 25%    Baseline 2 miles, stifness    Status Partially Met               PT Long Term Goals - 01/02/22 1021       PT LONG TERM GOAL #1   Title be independent in advanced HEP to reduce or eliminate stiffness with 3 mile walks, golf and within long car trips    Status On-going      PT LONG TERM GOAL #2   Title Improve FOTO score to >/= 72% to demo improved function      PT LONG TERM GOAL #3   Title Pt will report reduced stiffness of back and thighs by at least 70% with and following activity    Baseline 50%    Status On-going    Target Date 02/27/22      PT LONG TERM GOAL #4   Title Pt will achieve lumbar and thoracic ROM to  WFL with ease to allow for improved body mechanics for golf and bending tasks    Baseline working on thoracic rotation and LE flexibility                   Plan - 01/02/22 1046     Clinical Impression Statement Pt reports 50% improvement in mobility and pain.  He is compliant with HEP for  stretching and use of heat/massager 3x/day.  He has full trunk ROM without pain.  Lumbar segmnetal mobility is WNL with improved fascial mobility.  Pt has end range flexibility limitations of bil quads and hamstrings.  He needs VCs for functional movement patterns to coordinate squatting, lifting, and incorporation of core with dynamic ther ex.  FOTO score improved to 56%, up from last visit but still shy of initial visit and Pt admitted he is thinking differently about the questions, considering how he was years ago vs at initial visit which explains this change in score.  Pt travels to [place] 2x each of the next two months which is always an aggravating factor for him, so PT recommends taper to 2x/montly for next two months to see how travel affected pain/progress and to continue working on dynamic functional strength.    Personal Factors and Comorbidities Age;Profession;Time since onset of injury/illness/exacerbation;Comorbidity 1    Comorbidities Hx of prostate CA    Examination-Activity Limitations Bend;Sit;Locomotion Level    Examination-Participation Restrictions Occupation;Community Activity    Stability/Clinical Decision Making Stable/Uncomplicated    Clinical Decision Making Low    Rehab Potential Excellent    PT Frequency Other (comment)   2x/month   PT Duration 8 weeks    PT Treatment/Interventions ADLs/Self Care Home Management;Electrical Stimulation;Moist Heat;Cryotherapy;Neuromuscular re-education;Balance training;Therapeutic exercise;Therapeutic activities;Functional mobility training;Patient/family education;Manual techniques;Dry needling;Passive range of motion;Taping;Joint Manipulations;Spinal Manipulations    PT Next Visit Plan how did travles to [place] go? stretch quads, hamstrings, work on functional movement/strength    PT Home Exercise Plan Access Code: [email or web address]    Consulted and Agree with Plan of Care Patient             Patient will benefit from skilled therapeutic intervention  in order to improve the following deficits and impairments:  Decreased range of motion, Hypomobility, Impaired flexibility, Postural dysfunction, Improper body mechanics, Pain, Increased muscle spasms, Decreased balance  Visit Diagnosis: Chronic midline low back pain without sciatica - Plan: PT plan of care cert/re-cert  Abnormal posture - Plan: PT plan of care cert/re-cert     Problem List Patient Active Problem List   Diagnosis Date Noted   Educated about COVID-19 virus infection 09/06/2020   Dyslipidemia 09/06/2020   Elevated coronary artery calcium score 09/06/2020   Aortic root enlargement ([place]) 09/06/2020   Nonspecific abnormal electrocardiogram (ECG) (EKG) 08/28/2019   Thoracic aortic aneurysm without rupture 08/28/2019    Baruch Merl, PT 01/02/22 1:30 PM   [facility] @ [place], [place], [address] Phone: [phone]   Fax:  [phone]  Name: Duaine Radin MRN: [id number] Date of Birth: Dec 01, 1941

## 2022-01-09 ENCOUNTER — Encounter: Payer: Self-pay | Admitting: Physical Therapy

## 2022-01-09 ENCOUNTER — Other Ambulatory Visit: Payer: Self-pay

## 2022-01-09 ENCOUNTER — Ambulatory Visit: Payer: Medicare HMO | Attending: Family Medicine | Admitting: Physical Therapy

## 2022-01-09 DIAGNOSIS — R293 Abnormal posture: Secondary | ICD-10-CM | POA: Insufficient documentation

## 2022-01-09 DIAGNOSIS — M545 Low back pain, unspecified: Secondary | ICD-10-CM | POA: Diagnosis not present

## 2022-01-09 DIAGNOSIS — G8929 Other chronic pain: Secondary | ICD-10-CM | POA: Diagnosis present

## 2022-01-09 NOTE — Therapy (Signed)
[facility] @ [place], [place], [address] Phone: [phone]   Fax:  [phone]  Physical Therapy Treatment  Patient Details  Name: Miguel Walker MRN: [id number] Date of Birth: 01-05-1941 Referring Provider (PT): London Pepper, MD   Encounter Date: 01/09/2022   PT End of Session - 01/09/22 1150     Visit Number 14    Date for PT Re-Evaluation 02/27/22    Authorization Type [employer] Medicare    Progress Note Due on Visit 26    PT Start Time 1148    PT Stop Time 1228    PT Time Calculation (min) 40 min    Activity Tolerance Patient tolerated treatment well    Behavior During Therapy [facility] for tasks assessed/performed             Past Medical History:  Diagnosis Date   Aneurysm, ascending aorta    Cancer ([place]) 1997   Prostate cancer   Dyslipidemia    Family history of premature CAD    NO ISCHEMIC SYMPTOMS   Hyperlipidemia    Hypertension    Thyroid nodule     Past Surgical History:  Procedure Laterality Date   CARPAL TUNNEL RELEASE     HAND SURGERY     ROTATOR CUFF REPAIR     URINARY SPHINCTER IMPLANT      There were no vitals filed for this visit.   Subjective Assessment - 01/09/22 1151     Subjective I did so well, better than I expected to, on my drive to and from [place] using all my stretches and tools to help me not get stiff.  I'm even a 3/10 now, which is better than when I left even.    Pertinent History PMH: prostate cancer, HLD    Limitations Walking;Sitting;Standing    How long can you sit comfortably? uses lumbar support and tries to limit sitting to 30 min and 2.5 hours when driving    How long can you walk comfortably? 2-2.5 miles a day    Patient Stated Goals learn exercises to address lower back and thigh tightness, related to walking, driving and golf    Currently in Pain? Yes    Pain Score 3     Pain Location Back    Pain Descriptors / Indicators Tightness    Pain Type Chronic  pain    Pain Onset More than a month ago    Pain Frequency Intermittent    Aggravating Factors  prolonged sitting                               [facility] Adult PT Treatment/Exercise - 01/09/22 0001       Exercises   Exercises Shoulder;Lumbar;Knee/Hip      Lumbar Exercises: Stretches   Single Knee to Chest Stretch Left;Right;3 reps;10 seconds    Single Knee to Chest Stretch Limitations with foam roller under pelvis opp leg extended for hip flexor stretch    Double Knee to Chest Stretch 1 rep;30 seconds    Double Knee to Chest Stretch Limitations soft foam roller under pelvis    Quad Stretch Left;Right;2 reps;30 seconds    Quad Stretch Limitations prone with strap    Figure 4 Stretch 2 reps;20 seconds    Figure 4 Stretch Limitations foam roller under pelvis    Other Lumbar Stretch Exercise Y/W/T on soft foam roller x 5 rounds    Other Lumbar  Stretch Exercise childs pose 3-way x 5" 3 rounds      Lumbar Exercises: Aerobic   Recumbent Bike L3 x 4' PT present to discuss progress      Lumbar Exercises: Supine   Bridge 10 reps      Knee/Hip Exercises: Standing   Functional Squat 1 set;10 reps    Functional Squat Limitations 15lb kbell to 8" step    Walking with Sports Cord 20lb pulleys x 8 rounds    Other Standing Knee Exercises single leg T at counter x 8 each side 10lb kbell   improved hamstring length allowing greater excursion     Shoulder Exercises: Standing   Row Strengthening;20 reps;Theraband    Row Limitations alt Lt/Rt with ipsilateral rotation    Diagonals Strengthening;Both;15 reps;Theraband    Theraband Level (Shoulder Diagonals) Level 4 (Blue)    Diagonals Limitations double blue chop x 10                       PT Short Term Goals - 11/22/21 1022       PT SHORT TERM GOAL #1   Title be independent in initial HEP    Status Achieved      PT SHORT TERM GOAL #2   Title Pt will report improved stiffness following 3 mile walk by at  least 25%    Baseline 2 miles, stifness    Status Partially Met               PT Long Term Goals - 01/02/22 1021       PT LONG TERM GOAL #1   Title be independent in advanced HEP to reduce or eliminate stiffness with 3 mile walks, golf and within long car trips    Status On-going      PT LONG TERM GOAL #2   Title Improve FOTO score to >/= 72% to demo improved function      PT LONG TERM GOAL #3   Title Pt will report reduced stiffness of back and thighs by at least 70% with and following activity    Baseline 50%    Status On-going    Target Date 02/27/22      PT LONG TERM GOAL #4   Title Pt will achieve lumbar and thoracic ROM to [facility] with ease to allow for improved body mechanics for golf and bending tasks    Baseline working on thoracic rotation and LE flexibility                   Plan - 01/09/22 1226     Clinical Impression Statement Pt was able to travel to [place] and back without increased pain.  He even rated stiffness as lowest to date, a 3/10.  He needs intermittent cueing with RDLs and functional squats for upper body posture for scapular and cervical support and alignment, but otherwise demos excellent improvements in functional strength and mobility.  Ongoing end range limitations in bil quad flexibility; PT changed quad stretch to prone with strap with good tolerance today.  Pt has upcoming golf trips x 2 rounds next month and one more drive to [place] before end of cert period.  Continue along POC    PT Treatment/Interventions ADLs/Self Care Home Management;Electrical Stimulation;Moist Heat;Cryotherapy;Neuromuscular re-education;Balance training;Therapeutic exercise;Therapeutic activities;Functional mobility training;Patient/family education;Manual techniques;Dry needling;Passive range of motion;Taping;Joint Manipulations;Spinal Manipulations    PT Next Visit Plan continue functional strength, flexibility    PT Home Exercise Plan Access Code: [email or web address]  Consulted  and Agree with Plan of Care Patient             Patient will benefit from skilled therapeutic intervention in order to improve the following deficits and impairments:     Visit Diagnosis: Chronic midline low back pain without sciatica  Abnormal posture     Problem List Patient Active Problem List   Diagnosis Date Noted   Educated about COVID-19 virus infection 09/06/2020   Dyslipidemia 09/06/2020   Elevated coronary artery calcium score 09/06/2020   Aortic root enlargement ([place]) 09/06/2020   Nonspecific abnormal electrocardiogram (ECG) (EKG) 08/28/2019   Thoracic aortic aneurysm without rupture 08/28/2019    Baruch Merl, PT 01/09/22 12:29 PM   [facility] @ [place], [place], [address] Phone: [phone]   Fax:  [phone]  Name: Javin Nong MRN: [id number] Date of Birth: 12/23/1940

## 2022-01-16 ENCOUNTER — Encounter: Payer: Self-pay | Admitting: Physical Therapy

## 2022-01-16 ENCOUNTER — Ambulatory Visit: Payer: Medicare HMO | Admitting: Physical Therapy

## 2022-01-16 ENCOUNTER — Other Ambulatory Visit: Payer: Self-pay

## 2022-01-16 DIAGNOSIS — R293 Abnormal posture: Secondary | ICD-10-CM

## 2022-01-16 DIAGNOSIS — M545 Low back pain, unspecified: Secondary | ICD-10-CM | POA: Diagnosis not present

## 2022-01-16 NOTE — Therapy (Signed)
[facility] @ [place], [place], [address] Phone: [phone]   Fax:  [phone]  Physical Therapy Treatment  Patient Details  Name: Miguel Walker MRN: [id number] Date of Birth: 07-Jan-1941 Referring Provider (PT): London Pepper, MD   Encounter Date: 01/16/2022   PT End of Session - 01/16/22 1144     Visit Number 15    Date for PT Re-Evaluation 02/27/22    Authorization Type [employer]na Medicare    Progress Note Due on Visit 36    PT Start Time 1145    PT Stop Time 1228    PT Time Calculation (min) 43 min    Activity Tolerance Patient tolerated treatment well    Behavior During Therapy [facility] for tasks assessed/performed             Past Medical History:  Diagnosis Date   Aneurysm, ascending aorta    Cancer ([place]) 1997   Prostate cancer   Dyslipidemia    Family history of premature CAD    NO ISCHEMIC SYMPTOMS   Hyperlipidemia    Hypertension    Thyroid nodule     Past Surgical History:  Procedure Laterality Date   CARPAL TUNNEL RELEASE     HAND SURGERY     ROTATOR CUFF REPAIR     URINARY SPHINCTER IMPLANT      There were no vitals filed for this visit.   Subjective Assessment - 01/16/22 1147     Subjective I'm about the same - have maintained my improved state.  I had to move furniture to prep for house painting and did ok - no increased pain.    Pertinent History PMH: prostate cancer, HLD    Limitations Walking;Sitting;Standing    How long can you sit comfortably? uses lumbar support and tries to limit sitting to 30 min and 2.5 hours when driving    How long can you walk comfortably? 2-2.5 miles a day    Patient Stated Goals learn exercises to address lower back and thigh tightness, related to walking, driving and golf    Currently in Pain? Yes    Pain Score 3     Pain Location Back    Pain Orientation Lower;Mid    Pain Descriptors / Indicators Tightness    Pain Type Chronic pain    Pain  Onset More than a month ago    Pain Frequency Intermittent    Aggravating Factors  prolonged sitting    Pain Relieving Factors stretching, heat                               [facility] Adult PT Treatment/Exercise - 01/16/22 0001       Exercises   Exercises Shoulder;Lumbar;Knee/Hip      Lumbar Exercises: Stretches   Single Knee to Chest Stretch Left;Right;3 reps;10 seconds    Single Knee to Chest Stretch Limitations with foam roller under pelvis opp leg extended for hip flexor stretch    Double Knee to Chest Stretch 1 rep;30 seconds    Double Knee to Chest Stretch Limitations soft foam roller under pelvis, dynamic rocking    Quad Stretch Left;Right;30 seconds;1 rep    Quad Stretch Limitations prone with strap    Figure 4 Stretch 2 reps;20 seconds    Figure 4 Stretch Limitations foam roller under pelvis    Other Lumbar Stretch Exercise Y/W/T on soft foam roller x 5 rounds  Other Lumbar Stretch Exercise childs pose 3-way x 5" 3 rounds      Lumbar Exercises: Aerobic   Recumbent Bike L4 x 4' PT present to discuss progress      Lumbar Exercises: Supine   Bridge 10 reps    Bridge Limitations improved excursion      Manual Therapy   Manual Therapy Soft tissue mobilization;Myofascial release    Soft tissue mobilization lumbar skin rolling, lumbar paraspinals and fascia    Myofascial Release lumbar fascia with suction cup lift and drag bil                       PT Short Term Goals - 11/22/21 1022       PT SHORT TERM GOAL #1   Title be independent in initial HEP    Status Achieved      PT SHORT TERM GOAL #2   Title Pt will report improved stiffness following 3 mile walk by at least 25%    Baseline 2 miles, stifness    Status Partially Met               PT Long Term Goals - 01/02/22 1021       PT LONG TERM GOAL #1   Title be independent in advanced HEP to reduce or eliminate stiffness with 3 mile walks, golf and within long car trips     Status On-going      PT LONG TERM GOAL #2   Title Improve FOTO score to >/= 72% to demo improved function      PT LONG TERM GOAL #3   Title Pt will report reduced stiffness of back and thighs by at least 70% with and following activity    Baseline 50%    Status On-going    Target Date 02/27/22      PT LONG TERM GOAL #4   Title Pt will achieve lumbar and thoracic ROM to [facility] with ease to allow for improved body mechanics for golf and bending tasks    Baseline working on thoracic rotation and LE flexibility                   Plan - 01/16/22 1158     Clinical Impression Statement Pt is making excellent progress on hip flexor and quad flexibility bil.  He is ind with HEP needing very little cueing when working through stretch routine today.  Pt tolerated lifting and moving furniture this week to prep for interior house painting without exacerbation of pain, but did report some slight increased tightness.  Given he is driving to [place] later this week and has some return of tension PT performed some STM to lumbar region with good release and improved fascial mobility (suction cup used today). Pt will be traveling to both [place] and [place] for 4 days of golf before next session so this will be a good test of return to travel and golf.    Comorbidities Hx of prostate CA    PT Treatment/Interventions ADLs/Self Care Home Management;Electrical Stimulation;Moist Heat;Cryotherapy;Neuromuscular re-education;Balance training;Therapeutic exercise;Therapeutic activities;Functional mobility training;Patient/family education;Manual techniques;Dry needling;Passive range of motion;Taping;Joint Manipulations;Spinal Manipulations    PT Next Visit Plan continue functional strength, flexibility, how did travels to [place] and golf x 4 days go?    PT Home Exercise Plan Access Code: [email or web address]    Consulted and Agree with Plan of Care Patient             Patient will benefit from  skilled therapeutic intervention in  order to improve the following deficits and impairments:     Visit Diagnosis: Chronic midline low back pain without sciatica  Abnormal posture     Problem List Patient Active Problem List   Diagnosis Date Noted   Educated about COVID-19 virus infection 09/06/2020   Dyslipidemia 09/06/2020   Elevated coronary artery calcium score 09/06/2020   Aortic root enlargement ([place]) 09/06/2020   Nonspecific abnormal electrocardiogram (ECG) (EKG) 08/28/2019   Thoracic aortic aneurysm without rupture 08/28/2019    Baruch Merl, PT 01/16/22 12:31 PM  [place] @ [place], [place], [address] Phone: [phone]   Fax:  [phone]  Name: Miguel Walker MRN: [id number] Date of Birth: [birth year]

## 2022-02-05 ENCOUNTER — Inpatient Hospital Stay (HOSPITAL_COMMUNITY)
Admission: EM | Admit: 2022-02-05 | Discharge: 2022-02-08 | DRG: 247 | Disposition: A | Discharge status: Home / Self Care | Payer: Medicare HMO | Attending: Cardiology | Admitting: Cardiology

## 2022-02-05 ENCOUNTER — Encounter (HOSPITAL_COMMUNITY): Payer: Self-pay | Admitting: Cardiology

## 2022-02-05 ENCOUNTER — Other Ambulatory Visit: Payer: Self-pay

## 2022-02-05 ENCOUNTER — Ambulatory Visit (HOSPITAL_COMMUNITY): Admit: 2022-02-05 | Payer: Medicare HMO | Admitting: Cardiology

## 2022-02-05 ENCOUNTER — Encounter (HOSPITAL_COMMUNITY)
Admission: EM | Disposition: A | Discharge status: Home / Self Care | Payer: Self-pay | Source: Home / Self Care | Attending: Cardiology

## 2022-02-05 DIAGNOSIS — I219 Acute myocardial infarction, unspecified: Secondary | ICD-10-CM | POA: Diagnosis not present

## 2022-02-05 DIAGNOSIS — I25110 Atherosclerotic heart disease of native coronary artery with unstable angina pectoris: Secondary | ICD-10-CM | POA: Diagnosis present

## 2022-02-05 DIAGNOSIS — R931 Abnormal findings on diagnostic imaging of heart and coronary circulation: Secondary | ICD-10-CM | POA: Diagnosis not present

## 2022-02-05 DIAGNOSIS — Z8546 Personal history of malignant neoplasm of prostate: Secondary | ICD-10-CM

## 2022-02-05 DIAGNOSIS — I2111 ST elevation (STEMI) myocardial infarction involving right coronary artery: Secondary | ICD-10-CM | POA: Diagnosis not present

## 2022-02-05 DIAGNOSIS — I10 Essential (primary) hypertension: Secondary | ICD-10-CM | POA: Diagnosis present

## 2022-02-05 DIAGNOSIS — I213 ST elevation (STEMI) myocardial infarction of unspecified site: Secondary | ICD-10-CM

## 2022-02-05 DIAGNOSIS — K5900 Constipation, unspecified: Secondary | ICD-10-CM | POA: Diagnosis present

## 2022-02-05 DIAGNOSIS — Z20822 Contact with and (suspected) exposure to covid-19: Secondary | ICD-10-CM | POA: Diagnosis present

## 2022-02-05 DIAGNOSIS — Z79899 Other long term (current) drug therapy: Secondary | ICD-10-CM | POA: Diagnosis not present

## 2022-02-05 DIAGNOSIS — E785 Hyperlipidemia, unspecified: Secondary | ICD-10-CM | POA: Diagnosis present

## 2022-02-05 DIAGNOSIS — I2119 ST elevation (STEMI) myocardial infarction involving other coronary artery of inferior wall: Secondary | ICD-10-CM | POA: Diagnosis present

## 2022-02-05 DIAGNOSIS — Z955 Presence of coronary angioplasty implant and graft: Secondary | ICD-10-CM

## 2022-02-05 DIAGNOSIS — I7789 Other specified disorders of arteries and arterioles: Secondary | ICD-10-CM | POA: Diagnosis present

## 2022-02-05 DIAGNOSIS — Z7982 Long term (current) use of aspirin: Secondary | ICD-10-CM

## 2022-02-05 DIAGNOSIS — R110 Nausea: Secondary | ICD-10-CM | POA: Diagnosis not present

## 2022-02-05 DIAGNOSIS — Z8249 Family history of ischemic heart disease and other diseases of the circulatory system: Secondary | ICD-10-CM

## 2022-02-05 DIAGNOSIS — I2584 Coronary atherosclerosis due to calcified coronary lesion: Secondary | ICD-10-CM | POA: Diagnosis present

## 2022-02-05 DIAGNOSIS — I255 Ischemic cardiomyopathy: Secondary | ICD-10-CM | POA: Diagnosis present

## 2022-02-05 DIAGNOSIS — I442 Atrioventricular block, complete: Secondary | ICD-10-CM | POA: Diagnosis present

## 2022-02-05 HISTORY — PX: LEFT HEART CATH AND CORONARY ANGIOGRAPHY: CATH118249

## 2022-02-05 HISTORY — PX: CORONARY/GRAFT ACUTE MI REVASCULARIZATION: CATH118305

## 2022-02-05 LAB — LIPID PANEL
Cholesterol: 91 mg/dL (ref 0–200)
HDL: 38 mg/dL — ABNORMAL LOW (ref >40)
LDL Cholesterol: 42 mg/dL (ref 0–99)
Total CHOL/HDL Ratio: 2.4 RATIO
Triglycerides: 55 mg/dL (ref <150)
VLDL: 11 mg/dL (ref 0–40)

## 2022-02-05 LAB — POCT I-STAT, CHEM 8
BUN: 15 mg/dL (ref 8–23)
Calcium, Ion: 1.15 mmol/L (ref 1.15–1.40)
Chloride: 100 mmol/L (ref 98–111)
Creatinine, Ser: 0.6 mg/dL — ABNORMAL LOW (ref 0.61–1.24)
Glucose, Bld: 128 mg/dL — ABNORMAL HIGH (ref 70–99)
HCT: 34 % — ABNORMAL LOW (ref 39.0–52.0)
Hemoglobin: 11.6 g/dL — ABNORMAL LOW (ref 13.0–17.0)
Potassium: 3.7 mmol/L (ref 3.5–5.1)
Sodium: 137 mmol/L (ref 135–145)
TCO2: 25 mmol/L (ref 22–32)

## 2022-02-05 LAB — CBC
HCT: 33 % — ABNORMAL LOW (ref 39.0–52.0)
Hemoglobin: 11.3 g/dL — ABNORMAL LOW (ref 13.0–17.0)
MCH: 28.8 pg (ref 26.0–34.0)
MCHC: 34.2 g/dL (ref 30.0–36.0)
MCV: 84 fL (ref 80.0–100.0)
Platelets: 257 10*3/uL (ref 150–400)
RBC: 3.93 MIL/uL — ABNORMAL LOW (ref 4.22–5.81)
RDW: 12.7 % (ref 11.5–15.5)
WBC: 9.5 10*3/uL (ref 4.0–10.5)
nRBC: 0 % (ref 0.0–0.2)

## 2022-02-05 LAB — RESP PANEL BY RT-PCR (FLU A&B, COVID) ARPGX2
Influenza A by PCR: NEGATIVE
Influenza B by PCR: NEGATIVE
SARS Coronavirus 2 by RT PCR: NEGATIVE

## 2022-02-05 LAB — COMPREHENSIVE METABOLIC PANEL
ALT: 76 U/L — ABNORMAL HIGH (ref 0–44)
AST: 110 U/L — ABNORMAL HIGH (ref 15–41)
Albumin: 3.3 g/dL — ABNORMAL LOW (ref 3.5–5.0)
Alkaline Phosphatase: 219 U/L — ABNORMAL HIGH (ref 38–126)
Anion gap: 12 (ref 5–15)
BUN: 15 mg/dL (ref 8–23)
CO2: 24 mmol/L (ref 22–32)
Calcium: 8.6 mg/dL — ABNORMAL LOW (ref 8.9–10.3)
Chloride: 99 mmol/L (ref 98–111)
Creatinine, Ser: 0.73 mg/dL (ref 0.61–1.24)
GFR, Estimated: >60 mL/min (ref >60)
Glucose, Bld: 123 mg/dL — ABNORMAL HIGH (ref 70–99)
Potassium: 3.5 mmol/L (ref 3.5–5.1)
Sodium: 135 mmol/L (ref 135–145)
Total Bilirubin: 0.5 mg/dL (ref 0.3–1.2)
Total Protein: 6.7 g/dL (ref 6.5–8.1)

## 2022-02-05 LAB — HEMOGLOBIN A1C
Hgb A1c MFr Bld: 5.4 % (ref 4.8–5.6)
Mean Plasma Glucose: 108.28 mg/dL

## 2022-02-05 LAB — POCT ACTIVATED CLOTTING TIME
Activated Clotting Time: 245 seconds
Activated Clotting Time: 341 seconds

## 2022-02-05 LAB — APTT: aPTT: 32 seconds (ref 24–36)

## 2022-02-05 LAB — PROTIME-INR
INR: 1.1 (ref 0.8–1.2)
Prothrombin Time: 14.2 seconds (ref 11.4–15.2)

## 2022-02-05 LAB — TROPONIN I (HIGH SENSITIVITY): Troponin I (High Sensitivity): 8878 ng/L (ref <18)

## 2022-02-05 LAB — MRSA NEXT GEN BY PCR, NASAL: MRSA by PCR Next Gen: NOT DETECTED

## 2022-02-05 SURGERY — LEFT HEART CATH AND CORONARY ANGIOGRAPHY
Anesthesia: LOCAL

## 2022-02-05 MED ORDER — FENTANYL CITRATE (PF) 100 MCG/2ML IJ SOLN
INTRAMUSCULAR | Status: DC | PRN
  Administered 2022-02-05: 25 ug via INTRAVENOUS

## 2022-02-05 MED ORDER — TIROFIBAN (AGGRASTAT) BOLUS VIA INFUSION
INTRAVENOUS | Status: DC | PRN
  Administered 2022-02-05: 2332.5 ug via INTRAVENOUS

## 2022-02-05 MED ORDER — HEPARIN SODIUM (PORCINE) 1000 UNIT/ML IJ SOLN
INTRAMUSCULAR | Status: AC
  Filled 2022-02-05: qty 10

## 2022-02-05 MED ORDER — VERAPAMIL HCL 2.5 MG/ML IV SOLN
INTRAVENOUS | Status: AC
  Filled 2022-02-05: qty 2

## 2022-02-05 MED ORDER — HYDRALAZINE HCL 20 MG/ML IJ SOLN: 10.0000 mg | INTRAMUSCULAR | Status: AC | PRN

## 2022-02-05 MED ORDER — HEPARIN (PORCINE) IN NACL 1000-0.9 UT/500ML-% IV SOLN
INTRAVENOUS | Status: AC
  Filled 2022-02-05: qty 1000

## 2022-02-05 MED ORDER — MIDAZOLAM HCL 2 MG/2ML IJ SOLN
INTRAMUSCULAR | Status: DC | PRN
  Administered 2022-02-05: 1 mg via INTRAVENOUS

## 2022-02-05 MED ORDER — TICAGRELOR 90 MG PO TABS
90.0000 mg | ORAL_TABLET | Freq: Two times a day (BID) | ORAL | Status: DC
  Administered 2022-02-06 – 2022-02-08 (×5): 90 mg via ORAL
  Filled 2022-02-05 (×5): qty 1

## 2022-02-05 MED ORDER — LIDOCAINE HCL (PF) 1 % IJ SOLN
INTRAMUSCULAR | Status: DC | PRN
Start: 2022-02-05 — End: 2022-02-05
  Administered 2022-02-05: 2 mL

## 2022-02-05 MED ORDER — TIROFIBAN HCL IN NACL 5-0.9 MG/100ML-% IV SOLN
INTRAVENOUS | Status: AC | PRN
  Administered 2022-02-05: .15 ug/kg/min via INTRAVENOUS

## 2022-02-05 MED ORDER — MIDAZOLAM HCL 2 MG/2ML IJ SOLN
INTRAMUSCULAR | Status: AC
  Filled 2022-02-05: qty 2

## 2022-02-05 MED ORDER — SODIUM CHLORIDE 0.9% FLUSH
3.0000 mL | Freq: Two times a day (BID) | INTRAVENOUS | Status: DC
  Administered 2022-02-06 – 2022-02-08 (×4): 3 mL via INTRAVENOUS

## 2022-02-05 MED ORDER — HEPARIN SODIUM (PORCINE) 1000 UNIT/ML IJ SOLN
INTRAMUSCULAR | Status: DC | PRN
Start: 2022-02-05 — End: 2022-02-05
  Administered 2022-02-05: 3000 [IU] via INTRAVENOUS
  Administered 2022-02-05: 7500 [IU] via INTRAVENOUS

## 2022-02-05 MED ORDER — NITROGLYCERIN 1 MG/10 ML FOR IR/CATH LAB
INTRA_ARTERIAL | Status: DC | PRN
Start: 2022-02-05 — End: 2022-02-05
  Administered 2022-02-05 (×2): 200 ug

## 2022-02-05 MED ORDER — ACETAMINOPHEN 325 MG PO TABS: 650.0000 mg | ORAL_TABLET | ORAL | Status: DC | PRN

## 2022-02-05 MED ORDER — FENTANYL CITRATE (PF) 100 MCG/2ML IJ SOLN
INTRAMUSCULAR | Status: AC
  Filled 2022-02-05: qty 2

## 2022-02-05 MED ORDER — TIROFIBAN HCL IN NACL 5-0.9 MG/100ML-% IV SOLN
0.1500 ug/kg/min | INTRAVENOUS | Status: AC
  Administered 2022-02-05 – 2022-02-06 (×2): 0.15 ug/kg/min via INTRAVENOUS
  Filled 2022-02-05 (×2): qty 100

## 2022-02-05 MED ORDER — MORPHINE SULFATE (PF) 2 MG/ML IV SOLN: 1.0000 mg | INTRAVENOUS | Status: DC | PRN

## 2022-02-05 MED ORDER — SODIUM CHLORIDE 0.9% FLUSH: 3.0000 mL | INTRAVENOUS | Status: DC | PRN

## 2022-02-05 MED ORDER — TICAGRELOR 90 MG PO TABS
ORAL_TABLET | ORAL | Status: AC
  Filled 2022-02-05: qty 2

## 2022-02-05 MED ORDER — TICAGRELOR 90 MG PO TABS
ORAL_TABLET | ORAL | Status: DC | PRN
  Administered 2022-02-05: 180 mg via ORAL

## 2022-02-05 MED ORDER — METOPROLOL TARTRATE 12.5 MG HALF TABLET: 12.5000 mg | ORAL_TABLET | Freq: Two times a day (BID) | ORAL | Status: DC

## 2022-02-05 MED ORDER — LIDOCAINE HCL (PF) 1 % IJ SOLN
INTRAMUSCULAR | Status: AC
  Filled 2022-02-05: qty 30

## 2022-02-05 MED ORDER — LABETALOL HCL 5 MG/ML IV SOLN: 10.0000 mg | INTRAVENOUS | Status: DC | PRN

## 2022-02-05 MED ORDER — SODIUM CHLORIDE 0.9 % WEIGHT BASED INFUSION
1.0000 mL/kg/h | INTRAVENOUS | Status: AC
  Administered 2022-02-05 – 2022-02-06 (×2): 1 mL/kg/h via INTRAVENOUS

## 2022-02-05 MED ORDER — SODIUM CHLORIDE 0.9 % IV SOLN: 250.0000 mL | INTRAVENOUS | Status: DC | PRN

## 2022-02-05 MED ORDER — IOHEXOL 350 MG/ML SOLN
INTRAVENOUS | Status: DC | PRN
  Administered 2022-02-05: 200 mL

## 2022-02-05 MED ORDER — ONDANSETRON HCL 4 MG/2ML IJ SOLN: 4.0000 mg | Freq: Four times a day (QID) | INTRAMUSCULAR | Status: DC | PRN

## 2022-02-05 MED ORDER — VERAPAMIL HCL 2.5 MG/ML IV SOLN
INTRAVENOUS | Status: DC | PRN
  Administered 2022-02-05: 10 mL via INTRA_ARTERIAL

## 2022-02-05 SURGICAL SUPPLY — 27 items
BALLN  ~~LOC~~ SAPPHIRE 4.5X15 (BALLOONS) ×1
BALLN SAPPHIRE 3.0X12 (BALLOONS) ×2
BALLN SAPPHIRE 3.0X20 (BALLOONS) ×2
BALLN ~~LOC~~ SAPPHIRE 4.5X15 (BALLOONS) ×1
BALLOON SAPPHIRE 3.0X12 (BALLOONS) IMPLANT
BALLOON SAPPHIRE 3.0X20 (BALLOONS) IMPLANT
BALLOON ~~LOC~~ SAPPHIRE 4.5X15 (BALLOONS) IMPLANT
CATH LAUNCHER 6FR JR4 (CATHETERS) ×1 IMPLANT
CATH OPTITORQUE TIG 4.0 5F (CATHETERS) ×1 IMPLANT
CATH PRIORITY ONE AC 6F (CATHETERS) ×1 IMPLANT
DEVICE RAD COMP TR BAND LRG (VASCULAR PRODUCTS) ×2 IMPLANT
ELECT DEFIB PAD ADLT CADENCE (PAD) ×1 IMPLANT
GLIDESHEATH SLEND SS 6F .021 (SHEATH) ×1 IMPLANT
GUIDEWIRE INQWIRE 1.5J.035X260 (WIRE) IMPLANT
INQWIRE 1.5J .035X260CM (WIRE) ×2
KIT ENCORE 26 ADVANTAGE (KITS) ×1 IMPLANT
KIT HEART LEFT (KITS) ×2 IMPLANT
PACK CARDIAC CATHETERIZATION (CUSTOM PROCEDURE TRAY) ×2 IMPLANT
SHEATH PROBE COVER 6X72 (BAG) ×1 IMPLANT
STENT ONYX FRONTIER 3.5X34 (Permanent Stent) ×1 IMPLANT
STENT ONYX FRONTIER 4.0X15 (Permanent Stent) ×1 IMPLANT
STENT ONYX FRONTIER 4.0X38 (Permanent Stent) ×1 IMPLANT
SYR MEDRAD MARK 7 150ML (SYRINGE) ×2 IMPLANT
TRANSDUCER W/STOPCOCK (MISCELLANEOUS) ×2 IMPLANT
TUBING CIL FLEX 10 FLL-RA (TUBING) ×2 IMPLANT
WIRE ASAHI PROWATER 180CM (WIRE) ×1 IMPLANT
WIRE RUNTHROUGH .014X180CM (WIRE) ×1 IMPLANT

## 2022-02-05 NOTE — H&P (Signed)
Cardiology Admission History and Physical:   Patient ID: Miguel Walker MRN: [id number]; DOB: June 05, 1941   Admission date: 02/05/2022  PCP:  London Pepper, MD   [facility] Providers Cardiologist:  Minus Breeding, MD        Chief Complaint: Acute Inferolateral STEMI  Patient Profile:   Miguel Walker is a 81 y.o. male with coronary calcification seen on CT scan as well as family history of CAD at this age who is being seen 02/05/2022 for the evaluation of inferior STEMI.  History of Present Illness:   Miguel Walker had noticed feeling somewhat poorly over the last couple days with nausea fatigue.  Sounds a GI bug.  He had constipation and poor urine output.  However last night 02/04/2022 he started having bilateral arm pain radiating to the neck.  This began worrying him but when he woke up this morning at roughly 9 AM he had significant chest pain.  When the pain came on this morning, he counted his PCP.  Had an appointment at 4 PM.  During evaluation at his PCPs office where he had an EKG suggesting inferior ST elevations.  Code STEMI was called he was brought emergently as a code STEMI to the [facility] via the ER for COVID-19 school swab.Marland Kitchen  He says he has not been feeling well for about the last week or so.  He has been feeling somewhat nauseated and fatigued.  Has had nausea but no emesis.  He has had minimal bowel movements with constipation and upset stomach.  He has not had fevers or any significant dyspnea.  No syncope or near syncope.  No irregular heartbeats or palpitations.  No TIA or amaurosis fugax symptoms.   Past Medical History:  Diagnosis Date   Aneurysm, ascending aorta    Cancer ([place]) 1997   Prostate cancer   Dyslipidemia    Family history of premature CAD    NO ISCHEMIC SYMPTOMS   Hyperlipidemia    Hypertension    Thyroid nodule     Past Surgical History:  Procedure Laterality Date   CARPAL TUNNEL RELEASE     HAND SURGERY     ROTATOR CUFF REPAIR      URINARY SPHINCTER IMPLANT       Medications Prior to Admission: Prior to Admission medications   Medication Sig Start Date End Date Taking? Authorizing Provider  ascorbic acid (VITAMIN C) 500 MG tablet Take by mouth.    [provider]  aspirin 81 MG tablet Take 81 mg by mouth daily.    [provider]  Multiple Vitamin (MULTI-VITAMIN) tablet Take 1 tablet by mouth daily.    [provider]  Omega-3 1000 MG CAPS Take 1 g by mouth daily.    [provider]  rosuvastatin (CRESTOR) 40 MG tablet Take 1 tablet (40 mg total) by mouth daily. 09/08/21 12/07/21  Minus Breeding, MD  tolterodine (DETROL LA) 4 MG 24 hr capsule Take 4 mg by mouth daily. 08/24/20   [provider]     Allergies:   No Known Allergies  Social History:   Social History   Socioeconomic History   Marital status: Married    Spouse name: Not on file   Number of children: Not on file   Years of education: Not on file   Highest education level: Not on file  Occupational History   Not on file  Tobacco Use   Smoking status: Never   Smokeless tobacco: Never  Substance and Sexual  Activity   Alcohol use: Not on file   Drug use: Not on file   Sexual activity: Not on file  Other Topics Concern   Not on file  Social History Narrative   Retired.  Mrried    Social Determinants of Radio broadcast assistant Strain: Not on file  Food Insecurity: Not on file  Transportation Needs: Not on file  Physical Activity: Not on file  Stress: Not on file  Social Connections: Not on file  Intimate Partner Violence: Not on file    Family History: Reviewed The patient's family history includes CAD in his father.  ;  Father had MI at 62 with CABG.  Brother died at 32 with CAD  ROS:  Please see the history of present illness.  Other pertinent symptoms: Nausea, fatigue, malaise, constipation with only 1 BM this week over the last several days. Bilateral arm pain overnight.  All  other ROS reviewed and negative.     Physical Exam/Data:   Vitals:   02/05/22 1915 02/05/22 1920 02/05/22 1925 02/05/22 1930  BP: 131/61 130/88 124/73 127/80  Pulse: 74 75 88 73  Resp:      SpO2: 100% 95% 94% 93%  Weight:       No intake or output data in the 24 hours ending 02/05/22 1951 Last 3 Weights 02/05/2022 09/08/2021 09/08/2020  Weight (lbs) 205 lb 11 oz 203 lb 12.8 oz 201 lb 3.2 oz  Weight (kg) 93.3 kg 92.443 kg 91.264 kg     Body mass index is 28.69 kg/m.  General:  Well nourished, well developed, mild distress.  Has tightness 7/10 out of 10 HEENT: Normal.  PERRL Neck: no JVD Vascular: No carotid bruits; Distal pulses 2+ bilaterally   Cardiac:  normal S1, S2; RRR; no murmur cannot exclude soft 1/6 SEM Lungs:  clear to auscultation bilaterally, no wheezing, rhonchi or rales  Abd: soft, nontender, no hepatomegaly  Ext: 1-2+ bilateral ankle edema Musculoskeletal:  No deformities, BUE and BLE strength normal and equal Skin: warm and dry  Neuro:  CNs 2-12 intact, no focal abnormalities noted Psych:  Normal affect    EKG:  The ECG that was done in the patient's primary care physician's office but there was significant artifact in the inferior leads.  However there was evidence of inferior elevations in 2 3 and aVF. EMS EKG shows subtle 1 to 2 mm ST elevations in 2 3 and aVF with minimal R wave in 3 almost Q but there is always an 2 but not aVF.  Also subtle ST elevation in V4 through V6 with mild ST depressions in 1 and aVL.  Relevant CV Studies: GXT November 2020: Exercised 8: 21 minutes with METS 10.1.  Reached 97% max.  Heart rate.  No ischemic changes. CTA chest-aorta 09/29/2021: No significant thoracic aortic aneurysmal disease.  Stable.  Measures roughly 3.9 cm in sinus of Valsalva.   Laboratory Data:  High Sensitivity Troponin:  No results for input(s): TROPONINIHS in the last 720 hours.    Chemistry Recent Labs  Lab 02/05/22 1820  NA 137  K 3.7  CL 100   GLUCOSE 128*  BUN 15  CREATININE 0.60*    No results for input(s): PROT, ALBUMIN, AST, ALT, ALKPHOS, BILITOT in the last 168 hours. Lipids No results for input(s): CHOL, TRIG, HDL, LABVLDL, LDLCALC, CHOLHDL in the last 168 hours. Hematology Recent Labs  Lab 02/05/22 1817 02/05/22 1820  WBC 9.5  --   RBC 3.93*  --  HGB 11.3* 11.6*  HCT 33.0* 34.0*  MCV 84.0  --   MCH 28.8  --   MCHC 34.2  --   RDW 12.7  --   PLT 257  --    Thyroid No results for input(s): TSH, FREET4 in the last 168 hours. BNPNo results for input(s): BNP, PROBNP in the last 168 hours.  DDimer No results for input(s): DDIMER in the last 168 hours.   Radiology/Studies:  No results found.   Assessment and Plan:   Principal Problem:   Acute ST elevation myocardial infarction (STEMI) of inferolateral wall (HCC) Active Problems:   Hyperlipidemia LDL goal <70   Elevated coronary artery calcium score   Nausea in adult  Patient presented with what sounds like progressive symptoms of BV and potentially a viral illness over the course of the week, but started having bilateral arm and neck pain overnight and then this morning at roughly 8 AM had significant chest pain.  Went to EMS by 4 PM EKG showed ST elevations in inferior leads.  Code STEMI called and-EKG confirmed inferior STEMI  -> Cardiac catheter revealed heavily thrombotic 100s and occlusion of the proximal to distal RCA treated with Aggrastat, aspiration thrombectomy multiple balloon inflations and 3 overlapping DES stents.  Also noted distal left main 40 to 50% stenosis but not likely flow-limiting.  Otherwise moderate disease in the LAD and LCx.  This can be handled in the outpatient setting with evaluation based on symptoms.  -> Continued home dose of rosuvastatin along with aspirin. -> Added Brilinta; will run Aggrastat for 12 hours post PCI --> STEMI labs drawn including lipid panel etc.  We will add troponins for the morning. -> 2D echo ordered ->  Low-dose beta-blocker ordered (metoprolol 12.5 mg twice daily.  Will need to be gentle because of his Wenckebach block post procedure.)  Risk Assessment/Risk Scores:    TIMI Risk Score for ST  Elevation MI:   The patient's TIMI risk score is 6, which indicates a 16.1% risk of all cause mortality at 30 days.    He has some dyspnea, with class II CHF symptoms secondary to MI.   Severity of Illness: The appropriate patient status for this patient is INPATIENT. Inpatient status is judged to be reasonable and necessary in order to provide the required intensity of service to ensure the patient's safety. The patient's presenting symptoms, physical exam findings, and initial radiographic and laboratory data in the context of their chronic comorbidities is felt to place them at high risk for further clinical deterioration. Furthermore, it is not anticipated that the patient will be medically stable for discharge from the hospital within 2 midnights of admission.   * I certify that at the point of admission it is my clinical judgment that the patient will require inpatient hospital care spanning beyond 2 midnights from the point of admission due to high intensity of service, high risk for further deterioration and high frequency of surveillance required.*   For questions or updates, please contact [place] Please consult [email or web address] for contact info under     Signed, Glenetta Hew, MD  02/05/2022 7:51 PM

## 2022-02-06 ENCOUNTER — Other Ambulatory Visit (HOSPITAL_COMMUNITY): Payer: Self-pay

## 2022-02-06 ENCOUNTER — Inpatient Hospital Stay (HOSPITAL_COMMUNITY): Payer: Medicare HMO

## 2022-02-06 ENCOUNTER — Encounter (HOSPITAL_COMMUNITY): Payer: Self-pay | Admitting: Cardiology

## 2022-02-06 ENCOUNTER — Encounter: Payer: Medicare HMO | Admitting: Physical Therapy

## 2022-02-06 DIAGNOSIS — Z955 Presence of coronary angioplasty implant and graft: Secondary | ICD-10-CM

## 2022-02-06 DIAGNOSIS — I442 Atrioventricular block, complete: Secondary | ICD-10-CM | POA: Diagnosis not present

## 2022-02-06 DIAGNOSIS — I2119 ST elevation (STEMI) myocardial infarction involving other coronary artery of inferior wall: Secondary | ICD-10-CM

## 2022-02-06 DIAGNOSIS — I219 Acute myocardial infarction, unspecified: Secondary | ICD-10-CM | POA: Diagnosis present

## 2022-02-06 DIAGNOSIS — I25110 Atherosclerotic heart disease of native coronary artery with unstable angina pectoris: Secondary | ICD-10-CM | POA: Diagnosis present

## 2022-02-06 LAB — CBC
HCT: 33.7 % — ABNORMAL LOW (ref 39.0–52.0)
Hemoglobin: 11.2 g/dL — ABNORMAL LOW (ref 13.0–17.0)
MCH: 27.9 pg (ref 26.0–34.0)
MCHC: 33.2 g/dL (ref 30.0–36.0)
MCV: 84 fL (ref 80.0–100.0)
Platelets: 219 10*3/uL (ref 150–400)
RBC: 4.01 MIL/uL — ABNORMAL LOW (ref 4.22–5.81)
RDW: 12.9 % (ref 11.5–15.5)
WBC: 8.6 10*3/uL (ref 4.0–10.5)
nRBC: 0 % (ref 0.0–0.2)

## 2022-02-06 LAB — BASIC METABOLIC PANEL
Anion gap: 11 (ref 5–15)
BUN: 13 mg/dL (ref 8–23)
CO2: 23 mmol/L (ref 22–32)
Calcium: 8.3 mg/dL — ABNORMAL LOW (ref 8.9–10.3)
Chloride: 100 mmol/L (ref 98–111)
Creatinine, Ser: 0.75 mg/dL (ref 0.61–1.24)
GFR, Estimated: >60 mL/min (ref >60)
Glucose, Bld: 127 mg/dL — ABNORMAL HIGH (ref 70–99)
Potassium: 3.7 mmol/L (ref 3.5–5.1)
Sodium: 134 mmol/L — ABNORMAL LOW (ref 135–145)

## 2022-02-06 LAB — ECHOCARDIOGRAM COMPLETE
Area-P 1/2: 3.82 cm2
Calc EF: 43.7 %
Height: 71 in
S' Lateral: 3.3 cm
Single Plane A2C EF: 54.4 %
Single Plane A4C EF: 33 %
Weight: 3259.28 oz

## 2022-02-06 LAB — GLUCOSE, CAPILLARY: Glucose-Capillary: 117 mg/dL — ABNORMAL HIGH (ref 70–99)

## 2022-02-06 LAB — TROPONIN I (HIGH SENSITIVITY): Troponin I (High Sensitivity): 12998 ng/L (ref <18)

## 2022-02-06 MED ORDER — CHLORHEXIDINE GLUCONATE CLOTH 2 % EX PADS
6.0000 | MEDICATED_PAD | Freq: Every day | CUTANEOUS | Status: DC
  Administered 2022-02-07 – 2022-02-08 (×2): 6 via TOPICAL

## 2022-02-06 MED ORDER — PERFLUTREN LIPID MICROSPHERE
1.0000 mL | INTRAVENOUS | Status: AC | PRN
  Administered 2022-02-06: 2 mL via INTRAVENOUS
  Filled 2022-02-06: qty 10

## 2022-02-06 MED ORDER — ROSUVASTATIN CALCIUM 20 MG PO TABS
40.0000 mg | ORAL_TABLET | Freq: Every day | ORAL | Status: DC
Start: 2022-02-06 — End: 2022-02-08
  Administered 2022-02-06 – 2022-02-08 (×3): 40 mg via ORAL
  Filled 2022-02-06 (×3): qty 2

## 2022-02-06 MED ORDER — ASPIRIN EC 81 MG PO TBEC
81.0000 mg | DELAYED_RELEASE_TABLET | Freq: Every day | ORAL | Status: DC
  Administered 2022-02-06 – 2022-02-08 (×3): 81 mg via ORAL
  Filled 2022-02-06 (×3): qty 1

## 2022-02-06 NOTE — Assessment & Plan Note (Addendum)
Extensive thrombus noted in the occluded RCA.  Ran Aggrastat overnight. ?Now on DAPT for 3 overlapping stents.  Aggressive post dilation. ?=> Uninterrupted DAPT x1 year ?? After that would recommend long-term Thienopyridine single therapy after stopping aspirin.  (Can use Brilinta 60 mg twice daily or Plavix 75 mg daily.) ?

## 2022-02-06 NOTE — Progress Notes (Signed)
Progress Note  Patient Name: Miguel Walker Date of Encounter: 02/06/2022  [facility] Cardiologist: Minus Breeding, MD    Patient Profile     81 y.o. male with family history of brother and father having CAD in roughly their late 50s.  He was evaluated by Dr. Percival Spanish for coronary artery calcification.  Had been doing fine until this past week when he started feeling poorly with nausea, fatigue, constipation.  However on the evening of 02/04/2022 he started having discomfort in his arms and back and then woke up in the morning of 02/05/2022 with severe chest pain.  Went to PCPs office and at 4:00 had a EKG showing inferior STEMI.  Code STEMI called. => Found to have heavily thrombotic occlusion of the RCA requiring thrombectomy and extensive PCI with 3 overlapping stents.  Developed complete heart block overnight.  Principal Problem:   Acute ST elevation myocardial infarction (STEMI) of inferolateral wall [facility]) Active Problems:   Coronary artery thrombosis with myocardial infarction [facility])   Coronary artery disease involving native coronary artery of native heart with unstable angina pectoris (HCC)   Heart block AV complete (HCC)   Presence of drug coated stent in right coronary artery   Hyperlipidemia LDL goal <70   Assessment & Plan    Acute ST elevation myocardial infarction (STEMI) of inferolateral wall (HCC) Successful difficult PCI restoring TIMI-3 flow the RCA with 3 overlapping stents. Aggrastat overnight. No further chest pain 2D echo pending to assess EF.  Developed complete heart block overnight as well, therefore no beta-blocker. Statin dose increased to 40 mg Aspirin and Brilinta  Coronary artery thrombosis with myocardial infarction [facility]) Extensive thrombus noted in the occluded RCA.  Ran Aggrastat overnight. Now on DAPT for 3 overlapping stents.  Aggressive post dilation.  With the amount of stent placed will be on long-term Thienopyridine coverage  Presence  of drug coated stent in right coronary artery Aggrastat overnight.  Now on aspirin and Brilinta. => Plan long-term Thienopyridine following 1 year DAPT uninterrupted.  Coronary artery disease involving native coronary artery of native heart with unstable angina pectoris (HCC) Known to have coronary calcification, now with severe disease.  RCA stented because of occlusion.  3 overlapping stents. Has existing moderate to severe distal left main disease.  Likely more chronic.  This can be monitored in the outpatient setting, allowing time to recover from his large infarct.  Would potentially benefit from directed ischemic evaluation of the left main => would need to wait for least a month to consider Myoview.  Can discuss the possibility of Coronary CTA with CT FFR and other potential noninvasive methods versus possibly directed for measurement percutaneously.  No beta-blocker because of complete heart block Increase rosuvastatin to 40 mg daily DAPT As BP tolerates, would likely initiate ARB  Hyperlipidemia LDL goal <70 Lipids appear to be relatively well well-controlled based on labs from yesterday. I have increased rosuvastatin dose to 40 mg daily.  Heart block AV complete ([place]) He appeared to have Wenckebach block initially post PCI, however on further review it looks to be more consistent with complete heart block.  He has a good strong junctional escape rhythm but rates are in the 50s that can increase in his 108s with activity.  We will monitor him at least 1 more day here in the ICU to make sure that this stabilizes. Not unexpected to have heart block with an inferior MI, we will need to monitor and wait it out.  We will discuss  with the EP, but at this point no acute need for pacemaker.  Clearly we will hold beta-blocker.  ------------------------------------------------------------------------------------------------  Subjective   Feels much better.  No more nausea.  No more  fatigue the heaviness in his shoulders is gone.  No more chest pain. Has yet to walk around.  Inpatient Medications    Scheduled Meds:  sodium chloride flush  3 mL Intravenous Q12H   ticagrelor  90 mg Oral BID   Continuous Infusions:  sodium chloride     PRN Meds: sodium chloride, acetaminophen, morphine injection, ondansetron (ZOFRAN) IV, sodium chloride flush   Vital Signs    Vitals:   02/06/22 0703 02/06/22 0730 02/06/22 0745 02/06/22 0800  BP: 116/63 110/61  (!) 121/56  Pulse: 90 (!) 50  (!) 52  Resp: (!) 22 14  (!) 27  Temp:   98.4 F (36.9 C)   TempSrc:   Oral   SpO2: (!) 89% 95%  95%  Weight:      Height:        Intake/Output Summary (Last 24 hours) at 02/06/2022 0945 Last data filed at 02/06/2022 0700 Gross per 24 hour  Intake 260 ml  Output 550 ml  Net -290 ml   Last 3 Weights 02/05/2022 02/05/2022 09/08/2021  Weight (lbs) 203 lb 11.3 oz 205 lb 11 oz 203 lb 12.8 oz  Weight (kg) 92.4 kg 93.3 kg 92.443 kg      Telemetry    CHB rates 48-90 -  - Personally Reviewed  ECG    Sinus rhythm with complete heart block, junctional escape-heart rate 52 bpm.  Evolutionary changes of inferior MI. - Personally Reviewed  Physical Exam   GEN: Sitting up in chair, no acute distress.   Neck: No JVD or bruit Cardiac: Irregular rhythm, soft 1/6 SEM.  Otherwise no R/G .  Respiratory: Clear to auscultation bilaterally.  Nonlabored, good air movement GI: Soft, nontender, non-distended; NABS MS: No edema; No deformity.  Radial cath site no hematoma.  Mild bleed under the Tegaderm, but no swelling. Neuro:  Nonfocal  Psych: Normal affect   Labs    High Sensitivity Troponin:   Recent Labs  Lab 02/05/22 1817 02/06/22 0134  TROPONINIHS 8,878* 12,998*     Chemistry Recent Labs  Lab 02/05/22 1817 02/05/22 1820 02/06/22 0134  NA 135 137 134*  K 3.5 3.7 3.7  CL 99 100 100  CO2 24  --  23  GLUCOSE 123* 128* 127*  BUN '15 15 13  '$ CREATININE 0.73 0.60* 0.75  CALCIUM 8.6*   --  8.3*  PROT 6.7  --   --   ALBUMIN 3.3*  --   --   AST 110*  --   --   ALT 76*  --   --   ALKPHOS 219*  --   --   BILITOT 0.5  --   --   GFRNONAA >60  --  >60  ANIONGAP 12  --  11    Lipids  Recent Labs  Lab 02/05/22 1817  CHOL 91  TRIG 55  HDL 38*  LDLCALC 42  CHOLHDL 2.4    Hematology Recent Labs  Lab 02/05/22 1817 02/05/22 1820 02/06/22 0134  WBC 9.5  --  8.6  RBC 3.93*  --  4.01*  HGB 11.3* 11.6* 11.2*  HCT 33.0* 34.0* 33.7*  MCV 84.0  --  84.0  MCH 28.8  --  27.9  MCHC 34.2  --  33.2  RDW 12.7  --  12.9  PLT 257  --  219   Thyroid No results for input(s): TSH, FREET4 in the last 168 hours.  BNPNo results for input(s): BNP, PROBNP in the last 168 hours.  DDimer No results for input(s): DDIMER in the last 168 hours.   Radiology    CARDIAC CATHETERIZATION  Result Date: 02/05/2022   Prox RCA to Dist RCA lesion is 100% stenosed.  Acute Mrg lesion is 100% stenosed.   Mechanical aspiration thrombectomy performed   A drug-eluting stent was successfully placed in the very distal portion of the RCA, using a STENT ONYX FRONTIER 3.5X34.  Deployed to 3.7 mm distally, postdilated in the proximal half to 4.3 mm   A second drug-eluting stent was successfully placed  overlapping the first proximally in the mid vessel, using a STENT ONYX FRONTIER 4.0X38 -> deployed to 4.3 mm, postdilated to 4.7 mm   Prox RCA lesion is 50% stenosed with some residual lapsing thrombus.  Noted upstream from the first 2 stents.   A third drug-eluting stent was successfully placed overlapping second stent approximately, using a STENT ONYX FRONTIER 4.0X15 -> deployed to 4.3 mm and postdilated to 4.7 mm   Post intervention, there is a 0% residual stenosis throughout the entire stented segment all 3 stents =>  Deployed in tapered fashion 4.24m to 4.3 mm to 3.8 mm.   Post intervention, there is a 0% residual stenosis in the main RCA, but the acute marginal 100% thrombosis persisted.    ---------------------------------------------------------   Dist LM to Ost LAD lesion is 45% stenosed with 40% stenosed side branch in Ost Cx.   Mid LAD lesion is 45% stenosed.  Mid Cx lesion is 55% stenosed.   ---------------------------------------------------------   The left ventricular systolic function is normal.  The left ventricular ejection fraction is 55-65% by visual estimate. -Mid to apical inferior hypokinesis   LV end diastolic pressure is mildly elevated. SUMMARY Severe heavily thrombotic 100% occlusion (TIMI 0 flow) of the proximal to distal RCA all the way to the distal bifurcation; Successful complex revascularization with extensive balloon angioplasty, aspiration thrombectomy and 3 overlapping DES stents placement from proximal to distal RCA restoring TIMI-3 flow ( [place] 3.5 mm x 34 mm, 4.0 mm x 38 mm, 4.0 mm to 15 mm => postdilated in tapered fashion from 4.8 to 3.9 mm Reduced stenosis to 0% with TIMI-3 flow throughout the entire RCA system.  Only the mid vessel RV marginal branch did not recanalize with revascularization. Distal Left Main eccentric calcified 45% stenosis, mid LAD 40% and mid LCx 50% focal lesions. Preserved EF by LV gram with no obvious wall motion abnormality.  Mildly elevated LVEDP. RECOMMENDATIONS ICU admission, 12 hours Aggrastat and TR band removal per protocol We will add low-dose beta-blocker 12.5 mg metoprolol twice daily starting tonight He is already on statin, continue Appropriate labs checked including lipid panel and troponin levels Check 2D echo in the morning => if EF is stable, could consider reducing this to a discharge pending symptoms. Plan for now will be to treat the Left Main disease disease medically.  If symptoms warrant, we could consider ischemic evaluation of this lesion once he has recovered from his large MI. DGlenetta Hew MD    Cardiac Studies   Echocardiogram pending  CATH PCI 02/05/2021: 100% heavily thrombotic RCA occlusion:  Thrombectomy, PCI with 3 overlapping DES stents: 3.5 mm x 34 mm, 4.0 mm x 38 mm, 4.0 mm 15 mm (stented segment postdilated from 4.7 mm-4.3 mm -  3.8 mm).  (Restored TIMI-3 flow.) Diagnostic Dominance: Right      Intervention      For questions or updates, please contact [place] Please consult [email or web address] for contact info under        Signed, Glenetta Hew, MD  02/06/2022, 9:45 AM

## 2022-02-06 NOTE — Assessment & Plan Note (Addendum)
After initially having Wenckebach block post PCI, he did have complete heart block on the seventh.  Went back into sinus rhythm with improved recovery of the AV node. ? ?Now having more notable [employer] block as confirmed by EP. ? ?= Telemetry reviewed.  Confirm Wenckebach block after EP review as well.  We will plan 14-day Zio patch monitor to confirm only Wenckebach block and no complete heart block. ?

## 2022-02-06 NOTE — Assessment & Plan Note (Addendum)
Successful difficult PCI to the RCA: TIMI-3 flow restored after 3 overlapping DES stents placed => overnight Aggrastat due to significant thrombus ?Not expected, EF is somewhat reduced at 40 to 45%.  No active CHF symptoms. ?Initially Wenckebach block during PCI, then developed complete heart block on 02/06/2022.  Subsequently went to sinus rhythm and now in Wenckebach block. (Reviewed with EP) ? ?On ASA and Brilinta along with increased dose of rosuvastatin. ?No beta-blocker because of Wenkebach block and recent complete heart block ? ?Cardiac rehab consult ?

## 2022-02-06 NOTE — Plan of Care (Signed)

## 2022-02-06 NOTE — Assessment & Plan Note (Addendum)
Well-controlled lipids on evaluation.  With ACS presentation, statin dose increased to 40 mg. ?

## 2022-02-06 NOTE — Assessment & Plan Note (Addendum)
3 overlapping DES stents in the RCA: ?? On DAPT for at least 1 year. => Then convert to maintenance dose Thienopyridine for at least 1 more year but preferably long-term ?

## 2022-02-06 NOTE — Plan of Care (Signed)

## 2022-02-06 NOTE — TOC Benefit Eligibility Note (Addendum)
Patient Advocate Encounter ? ?Insurance verification completed.   ? ?The patient is currently admitted and upon discharge could be taking Brilinta '90mg'$  tabs. ? ?The current 30 day co-pay is $21.00 ? ?The patient is insured through [employer].  ? ? ?Sharlette Dense, CPhT ?Pharmacy Patient Advocate Specialist ?[place] Patient Advocate Team ?Direct Number: [phone]  Fax: [phone]  ?

## 2022-02-06 NOTE — Progress Notes (Signed)
Pt has walked x2 today with RN and Harmon Pier. Visiting with friends now. Left MI book and materials. Will f/u tomorrow. ?1440-1500 ?Yves Dill CES, ACSM ?3:00 PM ?02/06/2022 ? ?

## 2022-02-06 NOTE — Assessment & Plan Note (Addendum)
In addition to significant RCA disease now treated, there is left main stenosis with moderate disease elsewhere. ?? On increased dose Crestor 40 mg daily along with DAPT as discussed ?? No beta-blocker because of AV block ?? Not able to tolerate ARB because of blood pressure issues, have started low-dose spironolactone. ?? On Farxiga-has not required diuretic  ? ?Would allow for at least 4 to 6 weeks of healing and then potentially consider cardiac stress MRI to evaluate for ischemia with left main disease.  However in the absence of symptoms, I do not think any further treatment needs to be done.   ?

## 2022-02-07 ENCOUNTER — Other Ambulatory Visit (HOSPITAL_COMMUNITY): Payer: Self-pay

## 2022-02-07 LAB — BASIC METABOLIC PANEL
Anion gap: 8 (ref 5–15)
BUN: 17 mg/dL (ref 8–23)
CO2: 24 mmol/L (ref 22–32)
Calcium: 8.4 mg/dL — ABNORMAL LOW (ref 8.9–10.3)
Chloride: 102 mmol/L (ref 98–111)
Creatinine, Ser: 0.92 mg/dL (ref 0.61–1.24)
GFR, Estimated: >60 mL/min (ref >60)
Glucose, Bld: 208 mg/dL — ABNORMAL HIGH (ref 70–99)
Potassium: 3.5 mmol/L (ref 3.5–5.1)
Sodium: 134 mmol/L — ABNORMAL LOW (ref 135–145)

## 2022-02-07 MED ORDER — MELATONIN 3 MG PO TABS
3.0000 mg | ORAL_TABLET | Freq: Every day | ORAL | Status: DC
  Administered 2022-02-07 (×2): 3 mg via ORAL
  Filled 2022-02-07 (×2): qty 1

## 2022-02-07 MED ORDER — LOSARTAN POTASSIUM 25 MG PO TABS: 12.5000 mg | ORAL_TABLET | Freq: Every day | ORAL | Status: DC

## 2022-02-07 MED ORDER — DAPAGLIFLOZIN PROPANEDIOL 10 MG PO TABS
10.0000 mg | ORAL_TABLET | Freq: Every day | ORAL | Status: DC
  Administered 2022-02-07 – 2022-02-08 (×2): 10 mg via ORAL
  Filled 2022-02-07 (×2): qty 1

## 2022-02-07 NOTE — Care Management (Signed)
?  Transition of Care (TOC) Screening Note ? ? ?Patient Details  ?Name: Miguel Walker ?Date of Birth: 11-13-1941 ? ? ?Transition of Care (TOC) CM/SW Contact:    ?Graves-Bigelow, Ocie Cornfield, RN ?Phone Number: ?02/07/2022, 2:37 PM ? ? ? ?Transition of Care Department [facility]) has reviewed patient and no TOC needs have been identified at this time. We will continue to monitor patient advancement through interdisciplinary progression rounds. If new patient transition needs arise, please place a TOC consult. ?  ?

## 2022-02-07 NOTE — TOC Benefit Eligibility Note (Addendum)
Patient Advocate Encounter ?  ?Insurance verification completed.   ?  ?The patient is currently admitted and upon discharge could be taking FARXIGA. ?  ?The current 30 day co-pay is, $21.  ? ?The patient is insured through [employer] [place]. ? ? ?  ? ?

## 2022-02-07 NOTE — Progress Notes (Signed)
Patient arrived to 4E26 from [facility].  Telemetry monitor applied and CCMD notified.  Patient oriented to unit and room to include call light and phone.  Will continue to monitor. ?

## 2022-02-07 NOTE — Assessment & Plan Note (Signed)
This is listed as a problem list in his history, but not seen on echo. ?

## 2022-02-07 NOTE — Progress Notes (Addendum)
Progress Note  Patient Name: Miguel Walker Date of Encounter: 02/07/2022  [facility] HeartCare Cardiologist: Minus Breeding, MD    Patient Profile     81 y.o. male with family history of brother and father having CAD in roughly their late 61s.  He was evaluated by Dr. Percival Spanish for coronary artery calcification.  Had been doing fine until this past week when he started feeling poorly with nausea, fatigue, constipation.  However on the evening of 02/04/2022 he started having discomfort in his arms and back and then woke up in the morning of 02/05/2022 with severe chest pain.  Went to PCPs office and at 4:00 had a EKG showing inferior STEMI.  Code STEMI called. => Found to have heavily thrombotic occlusion of the RCA requiring thrombectomy and extensive PCI with 3 overlapping stents.  Developed complete heart block overnight.  Principal Problem:   Acute ST elevation myocardial infarction (STEMI) of inferolateral wall [facility]) Active Problems:   Coronary artery thrombosis with myocardial infarction [facility])   Coronary artery disease involving native coronary artery of native heart with unstable angina pectoris (HCC)   Heart block AV complete (HCC)   Presence of drug coated stent in right coronary artery   Hyperlipidemia LDL goal <70   Aortic root enlargement (HCC)   Assessment & Plan    Acute ST elevation myocardial infarction (STEMI) of inferolateral wall (HCC) Successful difficult PCI restoring TIMI-3 flow the RCA with 3 overlapping stents. Because of significant thrombus burden, was treated with IV Aggrastat overnight.  No bleeding issues on that.. No further chest pain Not expected, EF is somewhat reduced at 40 to 45%.  No active CHF symptoms.   Developed complete heart block overnight as well, therefore no beta-blocker. => Now in sinus rhythm with prolonged first-degree block. Increase statin dose to 40 mg. Aspirin and Brilinta Cardiac rehab consult  Coronary artery thrombosis with  myocardial infarction [facility]) Extensive thrombus noted in the occluded RCA.  Ran Aggrastat overnight. Now on DAPT for 3 overlapping stents.  Aggressive post dilation.  Significant stented segment.  Recommend long-term/lifelong Thienopyridine. Uninterrupted x1 year, then okay to hold for procedures 5 to 7 days preop  Presence of drug coated stent in right coronary artery Aggrastat overnight.  Now on aspirin and Brilinta. Plan: 1 year DAPT followed by long-term Thienopyridine monotherapy (either treatment dose 60 mg twice daily Brilinta or conversion to clopidogrel 75 mg daily).  After the 1 year point, okay to hold for procedures or surgeries.  Coronary artery disease involving native coronary artery of native heart with unstable angina pectoris (HCC) Known to have coronary calcification, now with severe disease.  RCA stented because of occlusion.  3 overlapping stents. Has existing moderate to severe distal left main disease.  Likely more chronic.  This can be monitored in the outpatient setting, allowing time to recover from his large infarct.  Discussed imaging options with Dr. Gayla Doss -in his opinion, the best imaging modality for this ischemic evaluation and viability is a cardiac stress MRI.  This can be ordered in the outpatient setting once stable.  Would likely wait at least 1 month to allow for complete healing.  Recovered from complete heart block, but now with a prolonged first-degree block.  No beta-blocker. Crestor increased to 40 mg daily. DAPT as discussed We will monitor blood pressure, was a little bit low this morning, plans would be to add low-dose ARB prior to discharge.  Unlikely that his blood pressure will tolerate both spironolactone and ARB,  however if ARB is too much, would convert to low-dose spironolactone 12.5 mg Add low-dose SGLT2 inhibitor (Farxiga) given reduced EF, CAD.  Hopefully this will allow [place] to avoid diuretic.  Hyperlipidemia LDL goal <70 Lipids  look relatively well controlled, but will increase rosuvastatin to 40 mg daily.  Heart block AV complete ([place]) Initially appeared to be in Wenckebach block post PCI, then was in complete heart block on March 7.  Overnight converted to more sinus rhythm with prolonged first-degree block. Given the underlying conduction delay, will hold off on beta-blockers.  It would appear that he is recovering his normal AV node function and likely will not require pacemaker.  Would like to monitor least 1 more day to ensure that he is stable.  Aortic root enlargement ([place]) This is listed as a problem list in his history, but not seen on echo.  ------------------------------------------------------------------------------------------------ Disposition: Transfer to telemetry today, if stable overnight with no further complete heart block, anticipate discharge in the morning.. We will need outpatient cardiac rehab consultation. With initial cardiac evaluation post hospital, recommend scheduling Cardiac Stress MRI (would like the require scheduling through Dr. Gayla Doss.)   Subjective   Feels well this morning.  No major concerns.  No chest pain or dyspnea.  No irregular heartbeats.  No dizziness.  Inpatient Medications    Scheduled Meds:  aspirin EC  81 mg Oral Daily   Chlorhexidine Gluconate Cloth  6 each Topical Daily   dapagliflozin propanediol  10 mg Oral Daily   melatonin  3 mg Oral QHS   rosuvastatin  40 mg Oral Daily   sodium chloride flush  3 mL Intravenous Q12H   ticagrelor  90 mg Oral BID   Continuous Infusions:  sodium chloride     PRN Meds: sodium chloride, acetaminophen, morphine injection, ondansetron (ZOFRAN) IV, sodium chloride flush   Vital Signs    Vitals:   02/07/22 0700 02/07/22 0745 02/07/22 0800 02/07/22 0900  BP: 107/77  (!) 105/55 121/61  Pulse: 88  79 73  Resp:      Temp:  98.5 F (36.9 C)    TempSrc:  Oral    SpO2: 93%  90% 92%  Weight:      Height:         Intake/Output Summary (Last 24 hours) at 02/07/2022 1213 Last data filed at 02/07/2022 0800 Gross per 24 hour  Intake 737.41 ml  Output 300 ml  Net 437.41 ml   Last 3 Weights 02/07/2022 02/05/2022 02/05/2022  Weight (lbs) 206 lb 12.7 oz 203 lb 11.3 oz 205 lb 11 oz  Weight (kg) 93.8 kg 92.4 kg 93.3 kg      Telemetry    CHB rates 48-90 -  - Personally Reviewed  ECG    Sinus rhythm with complete heart block, junctional escape-heart rate 52 bpm.  Evolutionary changes of inferior MI. - Personally Reviewed  Physical Exam   GEN: Sitting in chair.  No acute distress.  Had just walked around the nursing station without difficulty. Neck: No carotid bruit or JVD. Cardiac: Regular rhythm, soft 1/6 SEM.  Otherwise no rubs or gallops. Respiratory: CTA B, nonlabored, good air movement. GI: Soft/NT/ND/NABS.  No HSM. MS: Radial cath site CDI.  No edema.  No clubbing or cyanosis. Neuro:  Nonfocal  Psych: Normal mood and affect   Labs    High Sensitivity Troponin:   Recent Labs  Lab 02/05/22 1817 02/06/22 0134  TROPONINIHS 8,878* 12,998*     Chemistry Recent Labs  Lab 02/05/22 1817 02/05/22 1820 02/06/22 0134 02/07/22 0836  NA 135 137 134* 134*  K 3.5 3.7 3.7 3.5  CL 99 100 100 102  CO2 24  --  23 24  GLUCOSE 123* 128* 127* 208*  BUN '15 15 13 17  '$ CREATININE 0.73 0.60* 0.75 0.92  CALCIUM 8.6*  --  8.3* 8.4*  PROT 6.7  --   --   --   ALBUMIN 3.3*  --   --   --   AST 110*  --   --   --   ALT 76*  --   --   --   ALKPHOS 219*  --   --   --   BILITOT 0.5  --   --   --   GFRNONAA >60  --  >60 >60  ANIONGAP 12  --  11 8    Lipids  Recent Labs  Lab 02/05/22 1817  CHOL 91  TRIG 55  HDL 38*  LDLCALC 42  CHOLHDL 2.4    Hematology Recent Labs  Lab 02/05/22 1817 02/05/22 1820 02/06/22 0134  WBC 9.5  --  8.6  RBC 3.93*  --  4.01*  HGB 11.3* 11.6* 11.2*  HCT 33.0* 34.0* 33.7*  MCV 84.0  --  84.0  MCH 28.8  --  27.9  MCHC 34.2  --  33.2  RDW 12.7  --  12.9  PLT 257   --  219   Thyroid No results for input(s): TSH, FREET4 in the last 168 hours.  BNPNo results for input(s): BNP, PROBNP in the last 168 hours.  DDimer No results for input(s): DDIMER in the last 168 hours.   Radiology    No new studies  Cardiac Studies   Echocardiogram pending  CATH PCI 02/05/2021: 100% heavily thrombotic RCA occlusion: Thrombectomy, PCI with 3 overlapping DES stents: 3.5 mm x 34 mm, 4.0 mm x 38 mm, 4.0 mm 15 mm (stented segment postdilated from 4.7 mm-4.3 mm - 3.8 mm).  (Restored TIMI-3 flow.) Diagnostic Dominance: Right      Intervention      For questions or updates, please contact [place] Please consult [email or web address] for contact info under        Signed, Glenetta Hew, MD  02/07/2022, 12:13 PM

## 2022-02-07 NOTE — Discharge Instructions (Signed)

## 2022-02-07 NOTE — Progress Notes (Signed)
CARDIAC REHAB PHASE I  ? ?PRE:  Rate/Rhythm: 81 first deg with occ dropped beat ? ?  BP: sitting 105/56 ? ?  SaO2: 97 RA ? ?MODE:  Ambulation: 740 ft  ? ?POST:  Rate/Rhythm: 105 first deg ? ?  BP: sitting 129/75  ? ?  SaO2: 97 RA ? ?Tolerated well, thankful to walk. No c/o. Occasional dropped beat, pt asx. Discussed with pt MI, stent, restrictions, Brilinta, diet, exercise, NTG and CRPII. Pt very receptive, many appropriate questions. Will refer to [place].  ?[phone]  ? ?[place], ACSM ?02/07/2022 ?1:59 PM ? ? ? ? ?

## 2022-02-08 ENCOUNTER — Inpatient Hospital Stay (HOSPITAL_BASED_OUTPATIENT_CLINIC_OR_DEPARTMENT_OTHER)
Admit: 2022-02-08 | Discharge: 2022-02-08 | Disposition: A | Discharge status: Home / Self Care | Payer: Medicare HMO | Attending: Physician Assistant | Admitting: Physician Assistant

## 2022-02-08 DIAGNOSIS — I255 Ischemic cardiomyopathy: Secondary | ICD-10-CM

## 2022-02-08 DIAGNOSIS — I442 Atrioventricular block, complete: Secondary | ICD-10-CM

## 2022-02-08 MED ORDER — NITROGLYCERIN 0.4 MG SL SUBL: 0.4000 mg | SUBLINGUAL_TABLET | SUBLINGUAL | 3 refills | Status: DC | PRN

## 2022-02-08 MED ORDER — DAPAGLIFLOZIN PROPANEDIOL 10 MG PO TABS: 10.0000 mg | ORAL_TABLET | Freq: Every day | ORAL | 5 refills | Status: DC

## 2022-02-08 MED ORDER — TICAGRELOR 90 MG PO TABS: 90.0000 mg | ORAL_TABLET | Freq: Two times a day (BID) | ORAL | 11 refills | Status: DC

## 2022-02-08 MED ORDER — SPIRONOLACTONE 12.5 MG HALF TABLET
12.5000 mg | ORAL_TABLET | Freq: Every day | ORAL | Status: DC
  Administered 2022-02-08: 08:00:00 12.5 mg via ORAL
  Filled 2022-02-08: qty 1

## 2022-02-08 MED ORDER — SPIRONOLACTONE 25 MG PO TABS: 12.5000 mg | ORAL_TABLET | Freq: Every day | ORAL | 3 refills | Status: DC

## 2022-02-08 NOTE — TOC Transition Note (Signed)
Transition of Care (TOC) - CM/SW Discharge Note ?Marvetta Gibbons Therapist, sports, BSN ?Transitions of Care ?Unit 4E- RN Case Manager ?See Treatment Team for direct phone #  ? ? ?Patient Details  ?Name: Miguel Walker ?MRN: [id number] ?Date of Birth: 12-31-1940 ? ?Transition of Care (TOC) CM/SW Contact:  ?Dahlia Client, Romeo Rabon, RN ?Phone Number: ?02/08/2022, 2:40 PM ? ? ?Clinical Narrative:    ?Pt stable for transition home today, from home w/ wife. Has transportation home.  ?Pt has been placed on Brilinta  and Farxiga, scripts have been sent to [place]- pt provided 30 day free cards from drug company to use on discharge. Benefits check completed for copay. Per pt he uses [facility] for medications and will f/u with cardiology MD for 90 scripts to be sent to [facility] for his refills.  ? ?No further needs noted.  ? ? ?Final next level of care: Home/Self Care ?Barriers to Discharge: No Barriers Identified ? ? ?Patient Goals and CMS Choice ?  ?  ?Choice offered to / list presented to : NA ? ?Discharge Placement ?  ?           ? Home ?  ?  ?  ? ?Discharge Plan and Services ?  ?Discharge Planning Services: CM Consult, Medication Assistance (30 day drug cards provided) ?           ?  ?  ?  ?  ?  ?  ?  ?  ?  ?  ? ?Social Determinants of Health (SDOH) Interventions ?  ? ? ?Readmission Risk Interventions ?Readmission Risk Prevention Plan 02/08/2022  ?Post Dischage Appt Complete  ?Medication Screening Complete  ?Transportation Screening Complete  ?Some recent data might be hidden  ? ? ? ? ? ?

## 2022-02-08 NOTE — Progress Notes (Signed)
?  Reviewed case and tele with Dr. Quentin Ore ? ?Intermittent CHB as well as second degree (Mobitz 1 and 2:1 AV block) noted.  ? ?Asymptomatic.  ? ?With RCA revascularization, lack of symptoms, and narrow escape no urgent indication for pacing, would plan for Zio patch for monitoring of conduction as outpatient. Can follow up with EP prn based on results.  ? ?Lollie Marrow, PA-C  ?02/08/2022 11:45 AM  ?

## 2022-02-08 NOTE — Discharge Summary (Addendum)
Discharge Summary    Patient ID: Miguel Walker MRN: [id number]; DOB: 1941/02/07  Admit date: 02/05/2022 Discharge date: 02/08/2022  PCP:  Miguel Pepper, MD   [facility] Providers Cardiologist:  Minus Breeding, MD        Discharge Diagnoses    Principal Problem:   Acute ST elevation myocardial infarction (STEMI) of inferolateral wall [facility]) Active Problems:   Coronary artery thrombosis with myocardial infarction [facility])   Coronary artery disease involving native coronary artery of native heart with unstable angina pectoris ([place])   Heart block AV complete ([place])   Presence of drug coated stent in right coronary artery   Hyperlipidemia LDL goal <70   Ischemic cardiomyopathy   Diagnostic Studies/Procedures    CATH PCI 02/05/2021: 100% heavily thrombotic RCA occlusion: Thrombectomy, PCI with 3 overlapping DES stents: 3.5 mm x 34 mm, 4.0 mm x 38 mm, 4.0 mm 15 mm (stented segment postdilated from 4.7 mm-4.3 mm - 3.8 mm).  (Restored TIMI-3 flow.) Diagnostic Dominance: Right                                              Intervention    RECOMMENDATIONS ICU admission, 12 hours Aggrastat and TR band removal per protocol We will add low-dose beta-blocker 12.5 mg metoprolol twice daily starting tonight He is already on statin, continue Appropriate labs checked including lipid panel and troponin levels Check 2D echo in the morning => if EF is stable, could consider reducing this to a discharge pending symptoms. Plan for now will be to treat the Left Main disease disease medically.  If symptoms warrant, we could consider ischemic evaluation of this lesion once he has recovered from his large MI.    Echo 02/06/2022: EF 40 to 45%.  (Biplane estimated 44%).  Mild LVH.  GRII DD.  Elevated LVEDP.  Severe HK of the basal-mid inferoseptal and inferior wall.  Moderate reduced RV function.  Normal aortic and mitral valve.  Ascending aorta measured at 38 mm.  Suggestion of severely elevated RAP.  No prior  Echocardiogram.   ____________   History of Present Illness     Miguel Walker is a 81 y.o. male with coronary calcification seen on CT scan as well as family history of CAD at this age who is being seen 02/05/2022 for the evaluation of inferior STEMI.  [name] had noticed feeling somewhat poorly over the last couple days with nausea fatigue.  Sounds a GI bug.  He had constipation and poor urine output.  However last night 02/04/2022 he started having bilateral arm pain radiating to the neck.  This began worrying him but when he woke up this morning at roughly 9 AM he had significant chest pain.  When the pain came on this morning, he counted his PCP.  Had an appointment at 4 PM.  During evaluation at his PCPs office where he had an EKG suggesting inferior ST elevations.  Code STEMI was called he was brought emergently as a code STEMI to the [facility] via the ER for COVID-19 school swab.Marland Kitchen   He says he has not been feeling well for about the last week or so.  He has been feeling somewhat nauseated and fatigued.  Has had nausea but no emesis.  He has had minimal bowel movements with constipation and upset stomach.  He has not had fevers or any  significant dyspnea.  No syncope or near syncope.  No irregular heartbeats or palpitations.  No TIA or amaurosis fugax symptoms.  Hospital Course     Consultants: N/A   Patient was admitted with inferior STEMI.  Urgent cardiac catheterization performed on 02/05/2022 revealed 100% prox to distal RCA and 100% acute marginal treated with 3 overlapping DES, 45% distal LM to ost LAD, 45% mid LAD.  Postprocedure, he was started on aspirin and Brilinta.  Subsequent echocardiogram performed on 02/06/2022 showed EF 40-45%, grade 2 DD, severe hypokinesis of LV basal mid inferoseptal wall and inferior wall, trivial MR.  On 02/06/2022, he also had third-degree heart block.  During the hospitalization, he was not placed on beta-blocker due to concern of advanced heart  block.  He was started on Farxiga and spironolactone.  Blood pressure does not allow [place] to add an ARB.  He was seen in the morning of 02/08/2022, at which time it was noted that he has many nonconducted P waves and short 2-1 heart block on the heart monitor.  Patient was completely asymptomatic and denies any chest pain, shortness of breath or dizziness.  EP service was curbside consulted, given the asymptomatic nature, there was no urgent indication for pacemaker.  EP service recommended 2-week ZIO monitor with live monitoring feature.  ZIO monitor was placed on the patient prior to discharge.  Otherwise, he ambulated without any issue, he is deemed stable for discharge from a cardiac perspective.  I have sent a staff message to arrange 7 to 14-day TOC follow-up.  Depend on the result of the heart monitor, he may need outpatient EP consult as well (EP was not formally consulted in the hospital). He will need FLP and LFT in 6-8 weeks.  Would allow for at least 4 to 6 weeks of healing and then potentially consider cardiac stress MRI to evaluate for ischemia with left main disease.  However in the absence of symptoms, I do not think any further treatment needs to be done.     Avoid AV nodal blocking agent given CHB and 2:1 heart block   Did the patient have an acute coronary syndrome (MI, NSTEMI, STEMI, etc) this admission?:  Yes                               AHA/ACC Clinical Performance & Quality Measures: Aspirin prescribed? - Yes ADP Receptor Inhibitor (Plavix/Clopidogrel, Brilinta/Ticagrelor or Effient/Prasugrel) prescribed (includes medically managed patients)? - Yes Beta Blocker prescribed? - No - complete heart block High Intensity Statin (Lipitor 40-'80mg'$  or Crestor 20-'40mg'$ ) prescribed? - Yes EF assessed during THIS hospitalization? - Yes For EF <40%, was ACEI/ARB prescribed? - Not Applicable (EF >/= 37%) For EF <40%, Aldosterone Antagonist (Spironolactone or Eplerenone) prescribed? -  Yes Cardiac Rehab Phase II ordered (including medically managed patients)? - Yes       The patient will be scheduled for a TOC follow up appointment in 7 days.  A message has been sent to the [facility] and Scheduling Pool at the office where the patient should be seen for follow up.  _____________  Discharge Vitals Blood pressure 106/67, pulse 84, temperature 98.3 F (36.8 C), temperature source Oral, resp. rate 16, height '5\' 11"'$  (1.803 m), weight 93.8 kg, SpO2 97 %.  Filed Weights   02/05/22 1700 02/05/22 2156 02/07/22 0331  Weight: 93.3 kg 92.4 kg 93.8 kg    Labs & Radiologic Studies    CBC  Recent Labs    02/05/22 1817 02/05/22 1820 02/06/22 0134  WBC 9.5  --  8.6  HGB 11.3* 11.6* 11.2*  HCT 33.0* 34.0* 33.7*  MCV 84.0  --  84.0  PLT 257  --  509   Basic Metabolic Panel Recent Labs    02/06/22 0134 02/07/22 0836  NA 134* 134*  K 3.7 3.5  CL 100 102  CO2 23 24  GLUCOSE 127* 208*  BUN 13 17  CREATININE 0.75 0.92  CALCIUM 8.3* 8.4*   Liver Function Tests Recent Labs    02/05/22 1817  AST 110*  ALT 76*  ALKPHOS 219*  BILITOT 0.5  PROT 6.7  ALBUMIN 3.3*   No results for input(s): LIPASE, AMYLASE in the last 72 hours. High Sensitivity Troponin:   Recent Labs  Lab 02/05/22 1817 02/06/22 0134  TROPONINIHS 8,878* 12,998*    BNP Invalid input(s): POCBNP D-Dimer No results for input(s): DDIMER in the last 72 hours. Hemoglobin A1C Recent Labs    02/05/22 1817  HGBA1C 5.4   Fasting Lipid Panel Recent Labs    02/05/22 1817  CHOL 91  HDL 38*  LDLCALC 42  TRIG 55  CHOLHDL 2.4   Thyroid Function Tests No results for input(s): TSH, T4TOTAL, T3FREE, THYROIDAB in the last 72 hours.  Invalid input(s): FREET3 _____________   Disposition   Pt is being discharged home today in good condition.  Follow-up Plans & Appointments     Follow-up Information     Minus Breeding, MD Follow up.   Specialty: Cardiology Why: cardiology scheduler will  contact you to arrange follow up, please give [place] a call if you do not hear from our scheduler in 3 bussiness days Contact information: [place] [address] [phone]         Miguel Pepper, MD. Schedule an appointment as soon as possible for a visit.   Specialty: Family Medicine Contact information: [place] [address] [phone]                Discharge Instructions     Amb Referral to Cardiac Rehabilitation   Complete by: As directed    Diagnosis:  Coronary Stents STEMI PTCA     After initial evaluation and assessments completed: Virtual Based Care may be provided alone or in conjunction with Phase 2 Cardiac Rehab based on patient barriers.: Yes   Diet - low sodium heart healthy   Complete by: As directed    Increase activity slowly   Complete by: As directed        Discharge Medications   Allergies as of 02/08/2022   No Known Allergies      Medication List     STOP taking these medications    naproxen sodium 220 MG tablet Commonly known as: ALEVE       TAKE these medications    ascorbic acid 500 MG tablet Commonly known as: VITAMIN C Take 500 mg by mouth daily.   aspirin 81 MG tablet Take 81 mg by mouth daily.   dapagliflozin propanediol 10 MG Tabs tablet Commonly known as: FARXIGA Take 1 tablet (10 mg total) by mouth daily. Start taking on: February 09, 2022   Multi-Vitamin tablet Take 1 tablet by mouth daily.   Myrbetriq 25 MG Tb24 tablet Generic drug: mirabegron ER Take 25 mg by mouth daily.   nitroGLYCERIN 0.4 MG SL tablet Commonly known as: Nitrostat Place 1 tablet (0.4 mg total) under the  tongue every 5 (five) minutes as needed for chest pain.   Omega-3 1000 MG Caps Take 1,000 mg by mouth daily.   rosuvastatin 40 MG tablet Commonly known as: CRESTOR Take 1 tablet (40 mg total) by mouth daily.   spironolactone 25 MG tablet Commonly known as:  ALDACTONE Take 0.5 tablets (12.5 mg total) by mouth daily. Start taking on: February 09, 2022   ticagrelor 90 MG Tabs tablet Commonly known as: BRILINTA Take 1 tablet (90 mg total) by mouth 2 (two) times daily.   tolterodine 4 MG 24 hr capsule Commonly known as: DETROL LA Take 4 mg by mouth daily.           Outstanding Labs/Studies   FLP and LFT in 6-8 weeks  Duration of Discharge Encounter   Greater than 30 minutes including physician time.  SignedAlmyra Deforest, PA 02/08/2022, 1:45 PM  ATTENDING ATTESTATION  I have seen, examined and evaluated the patient this AM on rounds along with Almyra Deforest, PA.  After reviewing all the available data and chart, we discussed the patients laboratory, study & physical findings as well as symptoms in detail. I agree with his findings, examination as well as impression recommendations as per our discussion.    Attending adjustments noted in italics.   Acute ST elevation myocardial infarction (STEMI) of inferolateral wall (HCC) Successful difficult PCI to the RCA: TIMI-3 flow restored after 3 overlapping DES stents placed => overnight Aggrastat due to significant thrombus Not expected, EF is somewhat reduced at 40 to 45%.  No active CHF symptoms. Initially Wenckebach block during PCI, then developed complete heart block on 02/06/2022.  Subsequently went to sinus rhythm and now in Wenckebach block. (Reviewed with EP)  On ASA and Brilinta along with increased dose of rosuvastatin. No beta-blocker because of Wenkebach block and recent complete heart block  Cardiac rehab consult  Coronary artery thrombosis with myocardial infarction [facility]) Extensive thrombus noted in the occluded RCA.  Ran Aggrastat overnight. Now on DAPT for 3 overlapping stents.  Aggressive post dilation. => Uninterrupted DAPT x1 year After that would recommend long-term Thienopyridine single therapy after stopping aspirin.  (Can use Brilinta 60 mg twice daily or Plavix 75 mg  daily.)  Presence of drug coated stent in right coronary artery 3 overlapping DES stents in the RCA: On DAPT for at least 1 year. => Then convert to maintenance dose Thienopyridine for at least 1 more year but preferably long-term  Coronary artery disease involving native coronary artery of native heart with unstable angina pectoris ([place]) In addition to significant RCA disease now treated, there is left main stenosis with moderate disease elsewhere. On increased dose Crestor 40 mg daily along with DAPT as discussed No beta-blocker because of AV block Not able to tolerate ARB because of blood pressure issues, have started low-dose spironolactone. On Farxiga-has not required diuretic   Would allow for at least 4 to 6 weeks of healing and then potentially consider cardiac stress MRI to evaluate for ischemia with left main disease.  However in the absence of symptoms, I do not think any further treatment needs to be done.    Hyperlipidemia LDL goal <70 Well-controlled lipids on evaluation.  With ACS presentation, statin dose increased to 40 mg.  Heart block AV complete (HCC) After initially having Wenckebach block post PCI, he did have complete heart block on the seventh.  Went back into sinus rhythm with improved recovery of the AV node.  Now having more notable [employer] block  as confirmed by EP.  = Telemetry reviewed.  Confirm Wenckebach block after EP review as well.  We will plan 14-day Zio patch monitor to confirm only Wenckebach block and no complete heart block.  Aortic root enlargement ([place]) This is listed as a problem list in his history, but not seen on echo.  Ischemic cardiomyopathy Reduced EF on echo and LV gram with no significant evidence of CHF. Dilated IVC on echo, however he has no significant edema.  Would have low threshold to consider adding diuretic in addition to his Farxiga and spironolactone and outpatient follow-up.     Glenetta Hew, M.D.,  M.S. Interventional Cardiologist   Pager # [phone] Phone # [phone] [address] [place], [place] [address]

## 2022-02-08 NOTE — Progress Notes (Signed)
CARDIAC REHAB PHASE I  ? ?PRE:  Rate/Rhythm: 66 first deg with 2 beats 2nd deg ? ?  BP: sitting 116/74 ? ?  SaO2:  ? ?MODE:  Ambulation: 940 ft  ? ?POST:  Rate/Rhythm: 94 first deg, no dropped bts ? ?  BP: sitting 143/74  ? ?  SaO2:  ? ?Pt feeling well. Ambulated without c/o and perfect first degree (PR looked shorter as well). Once at rest in recliner, only 1 dropped beat noted in the 10 min I was with him. Asx. Gave pt Off the Beat book to review and explained.  ?[id number] ? ?[place], ACSM ?02/08/2022 ?10:50 AM ? ? ? ? ?

## 2022-02-08 NOTE — Progress Notes (Signed)
Order received to discharge patient.  Telemetry monitor removed and CCMD notified.  PIV access removed.  Discharge instructions, follow up, medications and instructions for their use discussed with patient. 

## 2022-02-08 NOTE — Progress Notes (Addendum)
Progress Note  Patient Name: Miguel Walker Date of Encounter: 02/08/2022  [facility] Cardiologist: Minus Breeding, MD   Subjective   Denies any CP or SOB   Inpatient Medications    Scheduled Meds:  aspirin EC  81 mg Oral Daily   Chlorhexidine Gluconate Cloth  6 each Topical Daily   dapagliflozin propanediol  10 mg Oral Daily   melatonin  3 mg Oral QHS   rosuvastatin  40 mg Oral Daily   sodium chloride flush  3 mL Intravenous Q12H   spironolactone  12.5 mg Oral Daily   ticagrelor  90 mg Oral BID   Continuous Infusions:  sodium chloride     PRN Meds: sodium chloride, acetaminophen, morphine injection, ondansetron (ZOFRAN) IV, sodium chloride flush   Vital Signs    Vitals:   02/07/22 1558 02/07/22 2051 02/08/22 0423 02/08/22 0814  BP: 137/85 119/63 109/68 106/67  Pulse: 84 68 68 84  Resp: '16 19 20 16  '$ Temp: 99.8 F (37.7 C) 98.2 F (36.8 C) 98.8 F (37.1 C) 98.3 F (36.8 C)  TempSrc: Oral Oral Oral Oral  SpO2: 100% 96% 96% 97%  Weight:      Height:       No intake or output data in the 24 hours ending 02/08/22 1620 Last 3 Weights 02/07/2022 02/05/2022 02/05/2022  Weight (lbs) 206 lb 12.7 oz 203 lb 11.3 oz 205 lb 11 oz  Weight (kg) 93.8 kg 92.4 kg 93.3 kg      Telemetry    NSR with frequent nonconducted p wave and transient 2:1 AV block - Personally Reviewed  ECG    Sinus rhythm, Wenckebach block with low voltage evolutionary changes of inferior STEMI in the inferior leads.  Subtle ST elevations or T wave inversions.  Stable (complete heart block Are present.- Personally Reviewed  Physical Exam   GEN: No acute distress.   Neck: No JVD Cardiac: RRR, no murmurs, rubs, or gallops.  Respiratory: Clear to auscultation bilaterally. GI: Soft, nontender, non-distended  MS: No edema; No deformity. Neuro:  Nonfocal  Psych: Normal affect   Labs    High Sensitivity Troponin:   Recent Labs  Lab 02/05/22 1817 02/06/22 0134  TROPONINIHS 8,878* 12,998*      Chemistry Recent Labs  Lab 02/05/22 1817 02/05/22 1820 02/06/22 0134 02/07/22 0836  NA 135 137 134* 134*  K 3.5 3.7 3.7 3.5  CL 99 100 100 102  CO2 24  --  23 24  GLUCOSE 123* 128* 127* 208*  BUN '15 15 13 17  '$ CREATININE 0.73 0.60* 0.75 0.92  CALCIUM 8.6*  --  8.3* 8.4*  PROT 6.7  --   --   --   ALBUMIN 3.3*  --   --   --   AST 110*  --   --   --   ALT 76*  --   --   --   ALKPHOS 219*  --   --   --   BILITOT 0.5  --   --   --   GFRNONAA >60  --  >60 >60  ANIONGAP 12  --  11 8    Lipids  Recent Labs  Lab 02/05/22 1817  CHOL 91  TRIG 55  HDL 38*  LDLCALC 42  CHOLHDL 2.4    Hematology Recent Labs  Lab 02/05/22 1817 02/05/22 1820 02/06/22 0134  WBC 9.5  --  8.6  RBC 3.93*  --  4.01*  HGB 11.3* 11.6* 11.2*  HCT  33.0* 34.0* 33.7*  MCV 84.0  --  84.0  MCH 28.8  --  27.9  MCHC 34.2  --  33.2  RDW 12.7  --  12.9  PLT 257  --  219   Thyroid No results for input(s): TSH, FREET4 in the last 168 hours.  BNPNo results for input(s): BNP, PROBNP in the last 168 hours.  DDimer No results for input(s): DDIMER in the last 168 hours.   Cardiac Studies   Cath March 07, 2022 CATH PCI 03/07/21: 100% heavily thrombotic RCA occlusion: Thrombectomy, PCI with 3 overlapping DES stents: 3.5 mm x 34 mm, 4.0 mm x 38 mm, 4.0 mm 15 mm (stented segment postdilated from 4.7 mm-4.3 mm - 3.8 mm).  (Restored TIMI-3 flow.) Diagnostic Dominance: Right                                              Intervention    Echo 02/06/2022: EF 40 to 45%.  (Biplane estimated 44%).  Mild LVH.  GRII DD.  Elevated LVEDP.  Severe HK of the basal-mid inferoseptal and inferior wall.  Moderate reduced RV function.  Normal aortic and mitral valve.  Ascending aorta measured at 38 mm.  Suggestion of severely elevated RAP.  Patient Profile     81 y.o. male who has a history of coronary artery calcification presented with inferior STEMI.   Assessment & Plan    Principal Problem:   Acute ST elevation myocardial  infarction (STEMI) of inferolateral wall [facility]) Active Problems:   Coronary artery thrombosis with myocardial infarction [facility])   Coronary artery disease involving native coronary artery of native heart with unstable angina pectoris (HCC)   Heart block AV complete (HCC)   Presence of drug coated stent in right coronary artery   Hyperlipidemia LDL goal <70   Ischemic cardiomyopathy   Inferior STEMI  - Echo 03/07/22 100% prox to distal RCA and 100% acute marginal treated with 3 overlapping DES, 45% distal LM to ost LAD, 45% mid LAD  - ASA and Brilinta   Ischemic cardiomyopathy - Echo 02/06/2022 EF 40-45%, grade 2 DD, severe hypokinesis of LV basal mid inferoseptal wall and inferior wall, trivial MR  - started on farxiga and spironolactone  HLD: continue crestor  Complete heart block: noted on telemetry on 3/7. No further complete heart block, but overnight telemetry reveals many nonconducted p wave and short 2:1 heart block. Will have MD to review and consider EP consult since he is >48 hours after initial MI. No AV nodal blocking agent at home or during this admission so far  I personally reviewed the telemetry.  It appears to be Wenckebach block and not complete heart block.  We have discussed electrophysiology -> they concurred that currently Wenckebach block no high-grade block since the CHB noted on March 7..  Recommendation is discharged home with a 14-day Zio patch monitor (preferably live).  No AV nodal agent -> no acute need for pacemaker placement.  Okay to discharge.  For questions or updates, please contact [place] Please consult [email or web address] for contact info under        Signed, Glenetta Hew, MD  02/08/2022, 4:20 PM    ATTENDING ATTESTATION  I have seen, examined and evaluated the patient this AM on rounds along with Almyra Deforest, PA.  After reviewing all the available data and chart, we discussed the patients laboratory, study &  physical findings as well as symptoms in  detail. I agree with his findings, examination as well as impression recommendations as per our discussion.    Attending adjustments noted in italics.   Acute ST elevation myocardial infarction (STEMI) of inferolateral wall (HCC) Successful difficult PCI to the RCA: TIMI-3 flow restored after 3 overlapping DES stents placed => overnight Aggrastat due to significant thrombus Not expected, EF is somewhat reduced at 40 to 45%.  No active CHF symptoms. Initially Wenckebach block during PCI, then developed complete heart block on 02/06/2022.  Subsequently went to sinus rhythm and now in Wenckebach block. (Reviewed with EP)  On ASA and Brilinta along with increased dose of rosuvastatin. No beta-blocker because of Wenkebach block and recent complete heart block  Cardiac rehab consult  Coronary artery thrombosis with myocardial infarction [facility]) Extensive thrombus noted in the occluded RCA.  Ran Aggrastat overnight. Now on DAPT for 3 overlapping stents.  Aggressive post dilation. => Uninterrupted DAPT x1 year After that would recommend long-term Thienopyridine single therapy after stopping aspirin.  (Can use Brilinta 60 mg twice daily or Plavix 75 mg daily.)  Presence of drug coated stent in right coronary artery 3 overlapping DES stents in the RCA: On DAPT for at least 1 year. => Then convert to maintenance dose Thienopyridine for at least 1 more year but preferably long-term  Coronary artery disease involving native coronary artery of native heart with unstable angina pectoris ([place]) In addition to significant RCA disease now treated, there is left main stenosis with moderate disease elsewhere. On increased dose Crestor 40 mg daily along with DAPT as discussed No beta-blocker because of AV block Not able to tolerate ARB because of blood pressure issues, have started low-dose spironolactone. On Farxiga-has not required diuretic   Would allow for at least 4 to 6 weeks of healing and then  potentially consider cardiac stress MRI to evaluate for ischemia with left main disease.  However in the absence of symptoms, I do not think any further treatment needs to be done.    Hyperlipidemia LDL goal <70 Well-controlled lipids on evaluation.  With ACS presentation, statin dose increased to 40 mg.  Heart block AV complete (HCC) After initially having Wenckebach block post PCI, he did have complete heart block on the seventh.  Went back into sinus rhythm with improved recovery of the AV node.  Now having more notable [place] Amber block as confirmed by EP.  = Telemetry reviewed.  Confirm Wenckebach block after EP review as well.  We will plan 14-day Zio patch monitor to confirm only Wenckebach block and no complete heart block.  Aortic root enlargement ([place]) This is listed as a problem list in his history, but not seen on echo.  Ischemic cardiomyopathy Reduced EF on echo and LV gram with no significant evidence of CHF. Dilated IVC on echo, however he has no significant edema.  Would have low threshold to consider adding diuretic in addition to his Farxiga and spironolactone and outpatient follow-up.      Glenetta Hew, M.D., M.S. Interventional Cardiologist   Pager # [phone] Phone # [phone] [address] [place], [place] [address]

## 2022-02-08 NOTE — Assessment & Plan Note (Signed)
Reduced EF on echo and LV gram with no significant evidence of CHF. ?Dilated IVC on echo, however he has no significant edema. ? ?Would have low threshold to consider adding diuretic in addition to his Farxiga and spironolactone and outpatient follow-up. ?

## 2022-02-09 ENCOUNTER — Telehealth: Payer: Self-pay | Admitting: Cardiology

## 2022-02-09 NOTE — Telephone Encounter (Signed)
Patient contacted regarding discharge from [facility] on 02/08/22. ? ?Patient understands to follow up with Diona Browner 02/16/22 at 2:15 pm at [facility] office. ?Patient understands discharge instructions. ?Patient understands medications and regiment. ?Patient understands to bring all medications to this visit. ?Patient stated he is feeling well this morning just alittle fatigued. ? ? ?

## 2022-02-09 NOTE — Telephone Encounter (Signed)
TOC scheduled for 2:15pm with Diona Browner, NP on 02/16/22 ?

## 2022-02-14 ENCOUNTER — Encounter: Payer: Medicare HMO | Admitting: Physical Therapy

## 2022-02-16 ENCOUNTER — Ambulatory Visit: Payer: Medicare HMO | Admitting: Nurse Practitioner

## 2022-02-19 ENCOUNTER — Telehealth: Payer: Self-pay | Admitting: Cardiology

## 2022-02-19 NOTE — Telephone Encounter (Signed)
New Message: ? ? ? ? ?Patient says he have a dry cough. He have it during the day, but it is worse at night. He wants to know what can he do about this? ?

## 2022-02-19 NOTE — Telephone Encounter (Signed)
Called patient, gave response from [facility].  ?Patient verbalized understanding.  ? ?

## 2022-02-19 NOTE — Telephone Encounter (Signed)
He is medications for overactive bladder which could cause dry mouth but do not see any medications that would definitively cause dry cough. Patient may need to follow up with PCP ?

## 2022-02-19 NOTE — Telephone Encounter (Signed)
Called patient, he states that he has had a dry cough for a few days, he states that it use to be just at night, but now it is during the day and all the time. He states he does not cough up anything, he is not sick, just having this dry cough. I advised I would send a message to our [facility] to see if they thought any medications could be causing this?  ?Patient aware thankful for any recommendations.  ? ?

## 2022-02-21 ENCOUNTER — Telehealth: Payer: Self-pay | Admitting: Cardiology

## 2022-02-21 ENCOUNTER — Other Ambulatory Visit: Payer: Self-pay

## 2022-02-21 MED ORDER — BENZONATATE 100 MG PO CAPS: 100.0000 mg | ORAL_CAPSULE | Freq: Three times a day (TID) | ORAL | 3 refills | Status: DC | PRN

## 2022-02-21 NOTE — Progress Notes (Signed)
Informed patient of tessalon perles. Prescription sent to his pharmacy. ?

## 2022-02-21 NOTE — Telephone Encounter (Signed)
Patietn reports dry hacking cough from the brilinta. It started this past Saturday and has gotten progressively worse. He uses steam with a couple of drops of eucalyptus oil at night that helps during the night. He denies bloody phlegm, fever, or dyspnea. He wants to know what can ease the dry cough or if medication can be changed. ?

## 2022-02-21 NOTE — Telephone Encounter (Signed)
Informed patient of prescription for tessalon perles for his cough. He will check with his pharmacist for potential side effects. ?

## 2022-02-21 NOTE — Telephone Encounter (Signed)
Pt c/o medication issue: ? ?1. Name of Medication: ticagrelor (BRILINTA) 90 MG TABS tablet ? ?2. How are you currently taking this medication (dosage and times per day)? Take 1 tablet (90 mg total) by mouth 2 (two) times daily. ? ?3. Are you having a reaction (difficulty breathing--STAT)? no ? ?4. What is your medication issue? Causing patient to have a dry cough. Please advise ? ?

## 2022-02-21 NOTE — Telephone Encounter (Signed)
Perhaps try an Rx for Tessalon pearls?  Cough drops as needed. I do recommend visit with PCP to rule out other causes that may not be Brilinta induced ?

## 2022-02-27 ENCOUNTER — Ambulatory Visit: Payer: Medicare HMO | Admitting: Nurse Practitioner

## 2022-02-27 ENCOUNTER — Ambulatory Visit: Payer: Medicare HMO | Attending: Family Medicine | Admitting: Physical Therapy

## 2022-02-27 ENCOUNTER — Encounter: Payer: Self-pay | Admitting: Nurse Practitioner

## 2022-02-27 ENCOUNTER — Encounter: Payer: Self-pay | Admitting: Physical Therapy

## 2022-02-27 ENCOUNTER — Other Ambulatory Visit: Payer: Self-pay

## 2022-02-27 VITALS — BP 110/68 | HR 87 | Ht 71.0 in | Wt 192.0 lb

## 2022-02-27 DIAGNOSIS — I10 Essential (primary) hypertension: Secondary | ICD-10-CM

## 2022-02-27 DIAGNOSIS — I442 Atrioventricular block, complete: Secondary | ICD-10-CM

## 2022-02-27 DIAGNOSIS — E785 Hyperlipidemia, unspecified: Secondary | ICD-10-CM

## 2022-02-27 DIAGNOSIS — I2510 Atherosclerotic heart disease of native coronary artery without angina pectoris: Secondary | ICD-10-CM

## 2022-02-27 DIAGNOSIS — R293 Abnormal posture: Secondary | ICD-10-CM | POA: Diagnosis present

## 2022-02-27 DIAGNOSIS — M545 Low back pain, unspecified: Secondary | ICD-10-CM | POA: Diagnosis present

## 2022-02-27 DIAGNOSIS — G8929 Other chronic pain: Secondary | ICD-10-CM | POA: Insufficient documentation

## 2022-02-27 DIAGNOSIS — I255 Ischemic cardiomyopathy: Secondary | ICD-10-CM | POA: Diagnosis not present

## 2022-02-27 DIAGNOSIS — I77810 Thoracic aortic ectasia: Secondary | ICD-10-CM

## 2022-02-27 MED ORDER — SPIRONOLACTONE 25 MG PO TABS: 12.5000 mg | ORAL_TABLET | Freq: Every day | ORAL | 3 refills | Status: DC

## 2022-02-27 MED ORDER — ROSUVASTATIN CALCIUM 40 MG PO TABS: 40.0000 mg | ORAL_TABLET | Freq: Every day | ORAL | 3 refills | Status: DC

## 2022-02-27 MED ORDER — TICAGRELOR 90 MG PO TABS: 90.0000 mg | ORAL_TABLET | Freq: Two times a day (BID) | ORAL | 3 refills | Status: DC

## 2022-02-27 MED ORDER — DAPAGLIFLOZIN PROPANEDIOL 10 MG PO TABS: 10.0000 mg | ORAL_TABLET | Freq: Every day | ORAL | 3 refills | Status: DC

## 2022-02-27 NOTE — Therapy (Signed)
[facility] [place] ?[place] [facility] [place] ?[place], [place], [address] ?Phone: [phone]   Fax:  [phone] ? ?Physical Therapy Treatment ? ?Patient Details  ?Name: Miguel Walker ?MRN: [id number] ?Date of Birth: 09/10/1941 ?Referring Provider (PT): Miguel Pepper, MD ? ? ?Encounter Date: 02/27/2022 ? ? PT End of Session - 02/27/22 1021   ? ? Visit Number 16   ? Date for PT Re-Evaluation 02/27/22   ? Authorization Type [employer] Medicare   ? Progress Note Due on Visit 20   ? PT Start Time 1020   ? PT Stop Time 1036   ? PT Time Calculation (min) 16 min   ? Activity Tolerance Patient tolerated treatment well   ? Behavior During Therapy [facility] for tasks assessed/performed   ? ?  ?  ? ?  ? ? ?Past Medical History:  ?Diagnosis Date  ? Aneurysm, ascending aorta   ? Cancer [facility]) 1997  ? Prostate cancer  ? Dyslipidemia   ? Family history of premature CAD   ? NO ISCHEMIC SYMPTOMS  ? Hyperlipidemia   ? Hypertension   ? Thyroid nodule   ? ? ?Past Surgical History:  ?Procedure Laterality Date  ? CARPAL TUNNEL RELEASE    ? CORONARY/GRAFT ACUTE MI REVASCULARIZATION N/A 02/05/2022  ? Procedure: Coronary/Graft Acute MI Revascularization;  Surgeon: Leonie Man, MD;  Location: [place] [facility];  Service: Cardiovascular;  Laterality: N/A;  ? HAND SURGERY    ? LEFT HEART CATH AND CORONARY ANGIOGRAPHY N/A 02/05/2022  ? Procedure: LEFT HEART CATH AND CORONARY ANGIOGRAPHY;  Surgeon: Leonie Man, MD;  Location: [place] [facility];  Service: Cardiovascular;  Laterality: N/A;  ? ROTATOR CUFF REPAIR    ? URINARY SPHINCTER IMPLANT    ? ? ?There were no vitals filed for this visit. ? ? Subjective Assessment - 02/27/22 1022   ? ? Subjective Pt got sick when traveling to [place], and on day 8 had chest pain and bil arm numbness.  Pt had a heart attack.  Was rushed to ER and had 3 stents put in.  Saw PCP last week and today sees cardiac doc.  He will start cardiac rehab next week.  Has started walking 30 min  daily.  My back isn't as tight as it used to be.  I am more easily fatigued since my heart procedure.   ? Pertinent History recent heart attack and lifting restriction   ? Limitations Walking;Sitting;Standing   ? How long can you sit comfortably? uses lumbar support and tries to limit sitting to 30 min and 2.5 hours when driving   ? How long can you walk comfortably? 2-2.5 miles a day   ? Patient Stated Goals learn exercises to address lower back and thigh tightness, related to walking, driving and golf   ? Currently in Pain? Yes   ? Pain Score 3    ? Pain Location Back   ? Pain Orientation Lower;Mid   ? Pain Descriptors / Indicators Tightness   ? Pain Type Chronic pain   ? ?  ?  ? ?  ? ? ? ? ? ? ? ? ? ? ? ? ? ? ? ? ? ? ? ? [facility] Adult PT Treatment/Exercise - 02/27/22 0001   ? ?  ? Self-Care  ? Self-Care Other Self-Care Comments   discussion of guided return to activity with cardiac rehab PTs guidance  ? ?  ?  ? ?  ? ? ? ? ? ? ? ? ? ? ? ?  PT Short Term Goals - 11/22/21 1022   ? ?  ? PT SHORT TERM GOAL #1  ? Title be independent in initial HEP   ? Status Achieved   ?  ? PT SHORT TERM GOAL #2  ? Title Pt will report improved stiffness following 3 mile walk by at least 25%   ? Baseline 2 miles, stifness   ? Status Partially Met   ? ?  ?  ? ?  ? ? ? ? PT Long Term Goals - 02/27/22 1032   ? ?  ? PT LONG TERM GOAL #1  ? Title be independent in advanced HEP to reduce or eliminate stiffness with 3 mile walks, golf and within long car trips   ? Status Achieved   ?  ? PT LONG TERM GOAL #2  ? Title Improve FOTO score to >/= 72% to demo improved function   ? Status Deferred   ?  ? PT LONG TERM GOAL #3  ? Title Pt will report reduced stiffness of back and thighs by at least 70% with and following activity   ? Status Achieved   ?  ? PT LONG TERM GOAL #4  ? Title Pt will achieve lumbar and thoracic ROM to [facility] with ease to allow for improved body mechanics for golf and bending tasks   ? Status Achieved   ? ?  ?  ? ?  ? ? ? ? ? ? ? ?  Plan - 02/27/22 1028   ? ? Clinical Impression Statement Pt returns to clinic after 1.5 month absense following traveling, becoming ill and having a heart attack/cardiac cath.  He will start cardiac rehab next week.  His LBP and bil thigh pain is well managed ,rating stiffness a 3/10.  He has put his HEP on hold but is walking 30 min daily per cardiac protocol.  He will guide his return to HEP stretches using input from cardiac rehab PTs.  He understands he may return for another round of PT as needed when cleared and with a new Rx.   ? Personal Factors and Comorbidities Age;Profession;Time since onset of injury/illness/exacerbation;Comorbidity 1   ? Comorbidities Hx of prostate CA   ? Stability/Clinical Decision Making Stable/Uncomplicated   ? Rehab Potential Excellent   ? PT Treatment/Interventions ADLs/Self Care Home Management;Electrical Stimulation;Moist Heat;Cryotherapy;Neuromuscular re-education;Balance training;Therapeutic exercise;Therapeutic activities;Functional mobility training;Patient/family education;Manual techniques;Dry needling;Passive range of motion;Taping;Joint Manipulations;Spinal Manipulations   ? PT Next Visit Plan d/c, Pt starting cardiac rehab   ? PT Home Exercise Plan Access Code: [email or web address]   ? Consulted and Agree with Plan of Care Patient   ? ?  ?  ? ?  ? ? ?Patient will benefit from skilled therapeutic intervention in order to improve the following deficits and impairments:    ? ?Visit Diagnosis: ?Chronic midline low back pain without sciatica ? ?Abnormal posture ? ? ? ? ?Problem List ?Patient Active Problem List  ? Diagnosis Date Noted  ? Ischemic cardiomyopathy 02/08/2022  ? Coronary artery thrombosis with myocardial infarction ([place]) 02/06/2022  ? Coronary artery disease involving native coronary artery of native heart with unstable angina pectoris ([place]) 02/06/2022  ? Heart block AV complete ([place]) 02/06/2022  ? Presence of drug coated stent in right coronary artery 02/06/2022  ? Acute  ST elevation myocardial infarction (STEMI) of inferolateral wall ([place]) 02/05/2022  ? Nausea in adult 02/05/2022  ? Educated about COVID-19 virus infection 09/06/2020  ? Hyperlipidemia LDL goal <70 09/06/2020  ? Elevated coronary artery calcium  score 09/06/2020  ? Aortic root enlargement ([place]) 09/06/2020  ? Nonspecific abnormal electrocardiogram (ECG) (EKG) 08/28/2019  ? Thoracic aortic aneurysm without rupture 08/28/2019  ? ? ?Miguel Walker, PT ?02/27/22 10:38 AM ? ?PHYSICAL THERAPY DISCHARGE SUMMARY ? ?Visits from Start of Care: 16 ? ?Current functional level related to goals / functional outcomes: ?See above ?  ?Remaining deficits: ?Pt starting cardiac rehab next week, d/c PT ?  ?Education / Equipment: ?HEP  ? ?Patient agrees to discharge. Patient goals were met. Patient is being discharged due to a change in medical status. ?Miguel Walker, PT ?02/27/22 10:38 AM ? ? ?[place] ?[place] [facility] [place] ?[place], [place], [address] ?Phone: [phone]   Fax:  [phone] ? ?Name: Miguel Walker ?MRN: [id number] ?Date of Birth: 1941/10/16 ? ? ? ?

## 2022-02-27 NOTE — Patient Instructions (Addendum)
Medication Instructions:  ?Your physician recommends that you continue on your current medications as directed. Please refer to the Current Medication list given to you today.  ? ?*If you need a refill on your cardiac medications before your next appointment, please call your pharmacy* ? ? ?Lab Work: ?NONE ordered at this time of appointment  ? ?If you have labs (blood work) drawn today and your tests are completely normal, you will receive your results only by: ?MyChart Message (if you have MyChart) OR ?A paper copy in the mail ?If you have any lab test that is abnormal or we need to change your treatment, we will call you to review the results. ? ? ?Testing/Procedures: ?NONE ordered at this time of appointment  ? ? ?Follow-Up: ?At [facility], you and your health needs are our priority.  As part of our continuing mission to provide you with exceptional heart care, we have created designated Provider Care Teams.  These Care Teams include your primary Cardiologist (physician) and Advanced Practice Providers (APPs -  Physician Assistants and Nurse Practitioners) who all work together to provide you with the care you need, when you need it. ? ?We recommend signing up for the patient portal called "MyChart".  Sign up information is provided on this After Visit Summary.  MyChart is used to connect with patients for Virtual Visits (Telemedicine).  Patients are able to view lab/test results, encounter notes, upcoming appointments, etc.  Non-urgent messages can be sent to your provider as well.   ?To learn more about what you can do with MyChart, go to [email or web address].   ? ?Your next appointment:   ?After Apr 09, 2022  ? ?The format for your next appointment:   ?In Person ? ?Provider:   ?Minus Breeding, MD  or Diona Browner, NP      ? ? ? ? ? ?

## 2022-02-27 NOTE — Progress Notes (Signed)
? ? ?Office Visit  ?  ?Patient Name: Miguel Walker ?Date of Encounter: 02/27/2022 ? ?Primary Care Provider:  London Pepper, MD ?Primary Cardiologist:  Minus Breeding, MD ? ?Chief Complaint  ?  ?81 year old male with a history of CAD, ischemic cardiomyopathy, hypertension, hyperlipidemia, dilation of ascending aorta, complete heart block, and prostate cancer who presents for post-hospital follow-up related to CAD. ? ?Past Medical History  ?  ?Past Medical History:  ?Diagnosis Date  ? Aneurysm, ascending aorta   ? Cancer [facility]) 1997  ? Prostate cancer  ? Dyslipidemia   ? Family history of premature CAD   ? NO ISCHEMIC SYMPTOMS  ? Hyperlipidemia   ? Hypertension   ? Thyroid nodule   ? ?Past Surgical History:  ?Procedure Laterality Date  ? CARPAL TUNNEL RELEASE    ? CORONARY/GRAFT ACUTE MI REVASCULARIZATION N/A 02/05/2022  ? Procedure: Coronary/Graft Acute MI Revascularization;  Surgeon: Leonie Man, MD;  Location: [place] [facility];  Service: Cardiovascular;  Laterality: N/A;  ? HAND SURGERY    ? LEFT HEART CATH AND CORONARY ANGIOGRAPHY N/A 02/05/2022  ? Procedure: LEFT HEART CATH AND CORONARY ANGIOGRAPHY;  Surgeon: Leonie Man, MD;  Location: [place] [facility];  Service: Cardiovascular;  Laterality: N/A;  ? ROTATOR CUFF REPAIR    ? URINARY SPHINCTER IMPLANT    ? ? ?Allergies ? ?No Known Allergies ? ?History of Present Illness  ?  ?81 year old male with a history of CAD, ischemic cardiomyopathy, hypertension, hyperlipidemia, dilation of ascending aorta, complete heart block, and prostate cancer.  ? ?ETT in November 2020 was low risk.  Lipitor was increased to 80 mg daily at the time.  He presented to his PCP's office on 02/05/2022 with complaints of bilateral arm pain radiating to the neck.  EKG at PCP office showed ST elevation.  He was transferred emergently to [facility] ED where he underwent emergent cardiac catheterization which showed percent thrombotic occlusion p-dRCA, treated with Aggrastat, aspiration  thrombectomy multiple balloon inflations, and DES x3 overlapping.  He was also noted to have dLM 40 to 50% stenosis with 40% stenosed sidebranch in oLCx (not thought to be flow-limiting), mLAD 45%, and mLCx 55%.  Echocardiogram showed EF 40 to 45%, mild LVH, G2 DD, severe hypokinesis of LV, basal to mid inferoseptal wall and inferior wall, no significant valvular abnormalities, borderline dilation of ascending aorta, 38 mm.  No clinical signs of heart failure.  He was started on aspirin and Brilinta, metoprolol, Farxiga, and spironolactone.  ARB was not initiated in the setting of hypotension.  On 02/06/2022 he had third-degree heart block.  EP was consulted, given asymptomatic nature, there was no urgent indication for pacemaker. 2 weeks ZIO monitor with live monitor was placed prior to discharge. Recommendations were made for possible cardiac stress MRI to evaluate for ischemia with left main disease, following 4 to 6 weeks of recovery post MI, however, it was thought that further intervention was likely not needed. He was discharged home in stable condition on 02/08/2022.   ? ?He presents today for follow-up accompanied by his wife.  Since his hospitalization he has done well from a cardiac standpoint.  He is eager to begin cardiac rehab and resume his regular activities. He just mailed in his heart monitor; results are pending.  He denies palpitations, dizziness, presyncope, syncope, PND, orthopnea, weight gain.  Denies symptoms concerning for angina. Overall, he reports feeling well, is tolerating current medications, and denies any specific concerns or complaints. ? ?Home Medications  ?  ?  Current Outpatient Medications  ?Medication Sig Dispense Refill  ? ascorbic acid (VITAMIN C) 500 MG tablet Take 500 mg by mouth daily.    ? aspirin 81 MG tablet Take 81 mg by mouth daily.    ? benzonatate (TESSALON PERLES) 100 MG capsule Take 1 capsule (100 mg total) by mouth 3 (three) times daily as needed for cough. 90 capsule  3  ? hydrocortisone 2.5 % cream Apply topically as needed.    ? metroNIDAZOLE (METROCREAM) 0.75 % cream Apply topically as needed (skin irritation).    ? Multiple Vitamin (MULTI-VITAMIN) tablet Take 1 tablet by mouth daily.    ? MYRBETRIQ 25 MG TB24 tablet Take 25 mg by mouth daily.    ? nitroGLYCERIN (NITROSTAT) 0.4 MG SL tablet Place 1 tablet (0.4 mg total) under the tongue every 5 (five) minutes as needed for chest pain. 25 tablet 3  ? Omega-3 1000 MG CAPS Take 1,000 mg by mouth daily.    ? tolterodine (DETROL LA) 4 MG 24 hr capsule Take 4 mg by mouth daily.    ? triamcinolone (KENALOG) 0.025 % cream Apply 1 application. topically as needed (skin irritation).    ? dapagliflozin propanediol (FARXIGA) 10 MG TABS tablet Take 1 tablet (10 mg total) by mouth daily. 30 tablet 3  ? rosuvastatin (CRESTOR) 40 MG tablet Take 1 tablet (40 mg total) by mouth daily. 90 tablet 3  ? spironolactone (ALDACTONE) 25 MG tablet Take 0.5 tablets (12.5 mg total) by mouth daily. 30 tablet 3  ? ticagrelor (BRILINTA) 90 MG TABS tablet Take 1 tablet (90 mg total) by mouth 2 (two) times daily. 60 tablet 3  ? ?No current facility-administered medications for this visit.  ?  ? ?Review of Systems  ?  ?He denies chest pain, palpitations, dyspnea, pnd, orthopnea, n, v, dizziness, syncope, edema, weight gain, or early satiety. All other systems reviewed and are otherwise negative except as noted above.  ? ? ? ?Cardiac Rehabilitation Eligibility Assessment  ?The patient is ready to start cardiac rehabilitation from a cardiac standpoint. ?  ? ?Physical Exam  ?  ?VS:  BP 110/68   Pulse 87   Ht '5\' 11"'$  (1.803 m)   Wt 192 lb (87.1 kg)   SpO2 100%   BMI 26.78 kg/m?  ?GEN: Well nourished, well developed, in no acute distress. ?HEENT: normal. ?Neck: Supple, no JVD, carotid bruits, or masses. ?Cardiac: RRR, no murmurs, rubs, or gallops. No clubbing, cyanosis, edema.  Radials/DP/PT 2+ and equal bilaterally.  Right radial cath site without bruising,  bleeding, or hematoma. ?Respiratory:  Respirations regular and unlabored, clear to auscultation bilaterally. ?GI: Soft, nontender, nondistended, BS + x 4. ?MS: no deformity or atrophy. ?Skin: warm and dry, no rash. ?Neuro:  Strength and sensation are intact. ?Psych: Normal affect. ? ?Accessory Clinical Findings  ?  ?ECG personally reviewed by me today -sinus rhythm with first-degree AV block, 87 bpm, PR 220 ms- no acute changes. ? ?Lab Results  ?Component Value Date  ? WBC 8.6 02/06/2022  ? HGB 11.2 (L) 02/06/2022  ? HCT 33.7 (L) 02/06/2022  ? MCV 84.0 02/06/2022  ? PLT 219 02/06/2022  ? ?Lab Results  ?Component Value Date  ? CREATININE 0.92 02/07/2022  ? BUN 17 02/07/2022  ? NA 134 (L) 02/07/2022  ? K 3.5 02/07/2022  ? CL 102 02/07/2022  ? CO2 24 02/07/2022  ? ?Lab Results  ?Component Value Date  ? ALT 76 (H) 02/05/2022  ? AST 110 (H) 02/05/2022  ?  ALKPHOS 219 (H) 02/05/2022  ? BILITOT 0.5 02/05/2022  ? ?Lab Results  ?Component Value Date  ? CHOL 91 02/05/2022  ? HDL 38 (L) 02/05/2022  ? [place] 42 02/05/2022  ? TRIG 55 02/05/2022  ? CHOLHDL 2.4 02/05/2022  ?  ?Lab Results  ?Component Value Date  ? HGBA1C 5.4 02/05/2022  ? ? ?Assessment & Plan  ?  ?1. CAD: S/p NSTEMI, PCI/DES-m-dRCA. Also noted to have dLM 40 to 50% stenosis with 40% stenosed sidebranch in oLCx (not thought to be flow-limiting), mLAD 45%, and mLCx 55%. Stable with no anginal symptoms. Could consider possible cardiac stress MRI in future, though patient prefers to continue medical therapy at this time. Continue aspirin, Brilinta, Farxiga, spironolactone, and Crestor.  Repeat labs (CBC, BMET pending per PCP).  ? ?2. ICM: Echo during recent hospitalization showed EF 40 to 45%, mild LVH, G2 DD, severe hypokinesis of LV, basal to mid inferoseptal wall and inferior wall, no significant valvular abnormalities, borderline dilation of ascending aorta, 38 mm. Euvolemic and well compensated on exam.  GDMT limited in the setting of hypotension. Not on  beta-blocker in the setting of  heart block. Continue Farxiga, spironolactone.  Consider repeat echocardiogram in 3 months. ? ?3. Complete heart block: Occurred during recent hospitalization likely in the setting of MI, b

## 2022-04-06 ENCOUNTER — Other Ambulatory Visit: Payer: Self-pay | Admitting: Nurse Practitioner

## 2022-04-08 DIAGNOSIS — R001 Bradycardia, unspecified: Secondary | ICD-10-CM | POA: Insufficient documentation

## 2022-04-08 DIAGNOSIS — I77810 Thoracic aortic ectasia: Secondary | ICD-10-CM | POA: Insufficient documentation

## 2022-04-08 DIAGNOSIS — I10 Essential (primary) hypertension: Secondary | ICD-10-CM | POA: Insufficient documentation

## 2022-04-08 NOTE — Progress Notes (Signed)
?  ?Cardiology Office Note ? ? ?Date:  04/10/2022  ? ?ID:  Miguel Walker, DOB 1941-02-20, MRN [id number] ? ?PCP:  London Pepper, MD  ?Cardiologist:   Minus Breeding, MD ?Referring:  London Pepper, MD ? ?Chief Complaint  ?Patient presents with  ? Coronary Artery Disease  ? ? ? ?  ?History of Present Illness: ?Miguel Walker is a 81 y.o. male who is self referred for evaluation of an abnormal echo.   In 2020 an echo demonstrated very slight aortic valve gradient and an aortic root enlargement.  I followed up with a CT which demonstrated 38 - 39 mm enlargement.  ? ?He had an ST elevation MI.  He had anatomy as described below.  He had a mildly reduced EF.   He had 3rd degree AV block and had follow up with a Zio patch.   Follow up monitor did not demonstrate high grade stenosis.   ? ?He is on the waiting list for rehab.    The patient denies any new symptoms such as chest discomfort, neck or arm discomfort. There has been no new shortness of breath, PND or orthopnea. There have been no reported palpitations, presyncope or syncope.   He is exercising on his own.  Any of the symptoms that he had at the time of his myocardial infarction. ? ? ? ?Past Medical History:  ?Diagnosis Date  ? Aneurysm, ascending aorta (HCC)   ? Cancer [facility]) 1997  ? Prostate cancer  ? Dyslipidemia   ? Family history of premature CAD   ? NO ISCHEMIC SYMPTOMS  ? Hyperlipidemia   ? Hypertension   ? Thyroid nodule   ? ? ?Past Surgical History:  ?Procedure Laterality Date  ? CARPAL TUNNEL RELEASE    ? CORONARY/GRAFT ACUTE MI REVASCULARIZATION N/A 02/05/2022  ? Procedure: Coronary/Graft Acute MI Revascularization;  Surgeon: Leonie Man, MD;  Location: [place] [facility];  Service: Cardiovascular;  Laterality: N/A;  ? HAND SURGERY    ? LEFT HEART CATH AND CORONARY ANGIOGRAPHY N/A 02/05/2022  ? Procedure: LEFT HEART CATH AND CORONARY ANGIOGRAPHY;  Surgeon: Leonie Man, MD;  Location: [place] [facility];  Service: Cardiovascular;  Laterality:  N/A;  ? ROTATOR CUFF REPAIR    ? URINARY SPHINCTER IMPLANT    ? ? ? ?Current Outpatient Medications  ?Medication Sig Dispense Refill  ? ascorbic acid (VITAMIN C) 500 MG tablet Take 500 mg by mouth daily.    ? aspirin 81 MG tablet Take 81 mg by mouth daily.    ? BRILINTA 90 MG TABS tablet TAKE 1 TABLET BY MOUTH TWICE  DAILY 180 tablet 3  ? FARXIGA 10 MG TABS tablet TAKE 1 TABLET BY MOUTH DAILY 90 tablet 3  ? hydrocortisone 2.5 % cream Apply topically as needed.    ? metroNIDAZOLE (METROCREAM) 0.75 % cream Apply topically as needed (skin irritation).    ? Multiple Vitamin (MULTI-VITAMIN) tablet Take 1 tablet by mouth daily.    ? MYRBETRIQ 25 MG TB24 tablet Take 25 mg by mouth daily.    ? nitroGLYCERIN (NITROSTAT) 0.4 MG SL tablet Place 1 tablet (0.4 mg total) under the tongue every 5 (five) minutes as needed for chest pain. 25 tablet 3  ? Omega-3 1000 MG CAPS Take 1,000 mg by mouth daily.    ? rosuvastatin (CRESTOR) 40 MG tablet Take 1 tablet (40 mg total) by mouth daily. 90 tablet 3  ? triamcinolone (KENALOG) 0.025 % cream Apply 1 application. topically as needed (skin  irritation).    ? spironolactone (ALDACTONE) 25 MG tablet Take 1 tablet (25 mg total) by mouth daily. 30 tablet 3  ? ?No current facility-administered medications for this visit.  ? ? ?Allergies:   Patient has no known allergies.  ? ?ROS:  Please see the history of present illness.   Otherwise, review of systems are positive for none.   All other systems are reviewed and negative.  ? ? ?PHYSICAL EXAM: ?VS:  BP 122/80   Pulse 88   Ht '5\' 11"'$  (1.803 m)   Wt 183 lb (83 kg)   SpO2 97%   BMI 25.52 kg/m?  , BMI Body mass index is 25.52 kg/m?. ?GENERAL:  Well appearing ?NECK:  No jugular venous distention, waveform within normal limits, carotid upstroke brisk and symmetric, no bruits, no thyromegaly ?LUNGS:  Clear to auscultation bilaterally ?CHEST:  Unremarkable ?HEART:  PMI not displaced or sustained,S1 and S2 within normal limits, no S3, no S4, no  clicks, no rubs, no murmurs ?ABD:  Flat, positive bowel sounds normal in frequency in pitch, no bruits, no rebound, no guarding, no midline pulsatile mass, no hepatomegaly, no splenomegaly ?EXT:  2 plus pulses throughout, no edema, no cyanosis no clubbing ? ? ? ?nGENERAL:  Well appearing ?NECK:  No jugular venous distention, waveform within normal limits, carotid upstroke brisk and symmetric, no bruits, no thyromegaly ?LUNGS:  Clear to auscultation bilaterally ?CHEST:  Unremarkable ?HEART:  PMI not displaced or sustained,S1 and S2 within normal limits, no S3, no S4, no clicks, no rubs, no murmurs ?ABD:  Flat, positive bowel sounds normal in frequency in pitch, no bruits, no rebound, no guarding, no midline pulsatile mass, no hepatomegaly, no splenomegaly ?EXT:  2 plus pulses throughout, no edema, no cyanosis no clubbing ? ? ?EKG:  EKG is not ordered today. ? ? ? ?Recent Labs: ?02/05/2022: ALT 76 ?02/06/2022: Hemoglobin 11.2; Platelets 219 ?02/07/2022: BUN 17; Creatinine, Ser 0.92; Potassium 3.5; Sodium 134  ? ? ?Lipid Panel ?   ?Component Value Date/Time  ? CHOL 91 02/05/2022 1817  ? CHOL 165 09/06/2021 1025  ? TRIG 55 02/05/2022 1817  ? HDL 38 (L) 02/05/2022 1817  ? HDL 49 09/06/2021 1025  ? CHOLHDL 2.4 02/05/2022 1817  ? VLDL 11 02/05/2022 1817  ? [place] 42 02/05/2022 1817  ? [place] 90 09/06/2021 1025  ? ?  ? ?Wt Readings from Last 3 Encounters:  ?04/10/22 183 lb (83 kg)  ?02/27/22 192 lb (87.1 kg)  ?02/07/22 206 lb 12.7 oz (93.8 kg)  ?  ?Cardiac cath ? ?Diagnostic ?Dominance: Right ?Intervention ? ? ?Other studies Reviewed: ?Additional studies/ records that were reviewed today include: Hospital records. ?Review of the above records demonstrates:  Please see elsewhere in the note.   ? ? ?ASSESSMENT AND PLAN: ? ?CAD:   He prefers medical management over possible stress MRI which was discussed in the hospital.  We will continue with aggressive secondary risk reduction. ?  ?ICM:   Today I am going to have him increase  his spironolactone to 25 mg daily.  I am going to check a basic metabolic profile in about a month.  I might Try to titrate by adding Entresto. ?  ?Complete heart block:     He has had no recurrence or evidence of this.  Symptomatically he is having no bradycardia arrhythmias.  His monitor demonstrated no significant bradycardia arrhythmias. ? ?Hypertension:  This is being managed in the context of treating his CHF  ? ?Hyperlipidemia: LDL will  be repeated in about 4 weeks on the higher dose of Lipitor.  ?  ?Dilation of ascending aorta:   I will follow this with repeat CT in about a year. ? ?Current medicines are reviewed at length with the patient today.  The patient does not have concerns regarding medicines. ? ?The following changes have been made: As above. ? ?Labs/ tests ordered today include: As above ? ?Orders Placed This Encounter  ?Procedures  ? Lipid panel  ? Basic metabolic panel  ? ? ? ? ?Disposition:   FU with me 1 month  ? ? ?Signed, ?Minus Breeding, MD  ?04/10/2022 1:51 PM    ?[place] ? ? ?

## 2022-04-10 ENCOUNTER — Encounter: Payer: Self-pay | Admitting: Cardiology

## 2022-04-10 ENCOUNTER — Ambulatory Visit: Payer: Medicare HMO | Admitting: Cardiology

## 2022-04-10 VITALS — BP 122/80 | HR 88 | Ht 71.0 in | Wt 183.0 lb

## 2022-04-10 DIAGNOSIS — I10 Essential (primary) hypertension: Secondary | ICD-10-CM | POA: Diagnosis not present

## 2022-04-10 DIAGNOSIS — R001 Bradycardia, unspecified: Secondary | ICD-10-CM

## 2022-04-10 DIAGNOSIS — I255 Ischemic cardiomyopathy: Secondary | ICD-10-CM | POA: Diagnosis not present

## 2022-04-10 DIAGNOSIS — I77810 Thoracic aortic ectasia: Secondary | ICD-10-CM

## 2022-04-10 DIAGNOSIS — I25110 Atherosclerotic heart disease of native coronary artery with unstable angina pectoris: Secondary | ICD-10-CM

## 2022-04-10 MED ORDER — SPIRONOLACTONE 25 MG PO TABS: 25.0000 mg | ORAL_TABLET | Freq: Every day | ORAL | 3 refills | Status: DC

## 2022-04-10 NOTE — Patient Instructions (Addendum)
Medication Instructions:  ?START: SPIRONOLACTONE '25mg'$  ONCE DAILY  ?*If you need a refill on your cardiac medications before your next appointment, please call your pharmacy* ? ?Lab Work: ? ?Please return for FASTING Blood Work in [place] 2 DAYS PRIOR TO NEXT APPOINTMENT WITH Dr. Percival Spanish. No appointment needed, lab here at the office is open Monday-Friday from 8AM to 4PM and closed daily for lunch from 12:45-1:45.  ? ?If you have labs (blood work) drawn today and your tests are completely normal, you will receive your results only by: ?MyChart Message (if you have MyChart) OR ?A paper copy in the mail ?If you have any lab test that is abnormal or we need to change your treatment, we will call you to review the results. ? ?Follow-Up: ?At [facility], you and your health needs are our priority.  As part of our continuing mission to provide you with exceptional heart care, we have created designated Provider Care Teams.  These Care Teams include your primary Cardiologist (physician) and Advanced Practice Providers (APPs -  Physician Assistants and Nurse Practitioners) who all work together to provide you with the care you need, when you need it. ? ?Your next appointment:   ?1 month(s) ? ?The format for your next appointment:   ?In Person ? ?Provider:   ?Minus Breeding, MD   ? ? ? ? ? ? ? ?

## 2022-05-04 ENCOUNTER — Encounter (HOSPITAL_COMMUNITY): Payer: Self-pay

## 2022-05-07 ENCOUNTER — Telehealth (HOSPITAL_COMMUNITY): Payer: Self-pay

## 2022-05-07 NOTE — Telephone Encounter (Signed)
Pt insurance is active and benefits verified through [facility]-pay $10, DED 0/0 met, out of pocket $400/$130 met, co-insurance 0%. no pre-authorization required. Passport, 05/04/2022@9 :04am, REF# [id number]  How many CR sessions are covered? (36 sessions for TCR, 72 sessions for ICR)72 Is this a lifetime maximum or an annual maximum? annual Has the member used any of these services to date? no Is there a time limit (weeks/months) on start of program and/or program completion? no

## 2022-05-07 NOTE — Telephone Encounter (Signed)
Pt called and is interested in the cardiac rehab program, pt will come in for orientation 05/15/2022'@9'$ :30am and will attend the 6:45am exercise class time.   Mailed packet.

## 2022-05-11 LAB — BASIC METABOLIC PANEL
BUN/Creatinine Ratio: 19 (ref 10–24)
BUN: 18 mg/dL (ref 8–27)
CO2: 25 mmol/L (ref 20–29)
Calcium: 9.4 mg/dL (ref 8.6–10.2)
Chloride: 100 mmol/L (ref 96–106)
Creatinine, Ser: 0.95 mg/dL (ref 0.76–1.27)
Glucose: 102 mg/dL — ABNORMAL HIGH (ref 70–99)
Potassium: 4.4 mmol/L (ref 3.5–5.2)
Sodium: 138 mmol/L (ref 134–144)
eGFR: 81 mL/min/{1.73_m2} (ref >59)

## 2022-05-11 LAB — LIPID PANEL
Chol/HDL Ratio: 2.7 ratio (ref 0.0–5.0)
Cholesterol, Total: 133 mg/dL (ref 100–199)
HDL: 50 mg/dL (ref >39)
LDL Chol Calc (NIH): 60 mg/dL (ref 0–99)
Triglycerides: 134 mg/dL (ref 0–149)
VLDL Cholesterol Cal: 23 mg/dL (ref 5–40)

## 2022-05-14 ENCOUNTER — Telehealth (HOSPITAL_COMMUNITY): Payer: Self-pay | Admitting: *Deleted

## 2022-05-14 NOTE — Telephone Encounter (Signed)
Left message to call cardiac rehab.Dallys Nowakowski Walden Mckyle Solanki RN BSN  

## 2022-05-14 NOTE — Progress Notes (Signed)
Cardiology Office Note   Date:  05/15/2022   ID:  Rj Walker, DOB 01-14-1941, MRN [id number]  PCP:  Miguel Pepper, MD  Cardiologist:   Minus Breeding, MD Referring:  Miguel Pepper, MD  Chief Complaint  Patient presents with   Coronary Artery Disease       History of Present Illness: Miguel Walker is a 81 y.o. male who is self referred for evaluation of an abnormal echo.   In 2020 an echo demonstrated very slight aortic valve gradient and an aortic root enlargement.  I followed up with a CT which demonstrated 38 - 39 mm enlargement.   He had an ST elevation MI.  He had anatomy as described below.  He had a mildly reduced EF.   He had 3rd degree AV block and had follow up with a Zio patch.   Follow up monitor did not demonstrate high grade stenosis.  At the last visit I titrated his spironolactone.    He did well with this.  He is just about to start cardiac rehab.  Has been doing some walking.  Gets a little bit of burning in his thighs when he does this but he has not been doing much of that following his MI.  He does give me some results of the peripheral vascular screening that suggests right foot 0.77 ABI and left 0.57.  However, he has excellent pulses.  He has no resting pain.  He has no nonhealing ulcers.  He says he has been feeling well.  He is walking and golfing.  He denies any cardiovascular symptoms such as chest pressure, neck or arm discomfort.  He had no new shortness of breath, PND or orthopnea.  He said no weight gain and in fact is lost 15 pounds.  He has had no new edema.    Past Medical History:  Diagnosis Date   Aneurysm, ascending aorta ([place])    Cancer ([place]) 1997   Prostate cancer   Coronary artery disease    Dyslipidemia    Family history of premature CAD    NO ISCHEMIC SYMPTOMS   Hyperlipidemia    Hypertension    Thyroid nodule     Past Surgical History:  Procedure Laterality Date   CARDIAC CATHETERIZATION     CARPAL TUNNEL RELEASE      CORONARY/GRAFT ACUTE MI REVASCULARIZATION N/A 02/05/2022   Procedure: Coronary/Graft Acute MI Revascularization;  Surgeon: Miguel Man, MD;  Location: [facility];  Service: Cardiovascular;  Laterality: N/A;   HAND SURGERY     LEFT HEART CATH AND CORONARY ANGIOGRAPHY N/A 02/05/2022   Procedure: LEFT HEART CATH AND CORONARY ANGIOGRAPHY;  Surgeon: Miguel Man, MD;  Location: [facility];  Service: Cardiovascular;  Laterality: N/A;   ROTATOR CUFF REPAIR     URINARY SPHINCTER IMPLANT       Current Outpatient Medications  Medication Sig Dispense Refill   ascorbic acid (VITAMIN C) 500 MG tablet Take 500 mg by mouth in the morning.     aspirin EC 81 MG tablet Take 81 mg by mouth in the morning.     BRILINTA 90 MG TABS tablet TAKE 1 TABLET BY MOUTH TWICE  DAILY 180 tablet 3   FARXIGA 10 MG TABS tablet TAKE 1 TABLET BY MOUTH DAILY 90 tablet 3   hydrocortisone 2.5 % cream Apply 1 application  topically 2 (two) times daily as needed (irritation.).     metroNIDAZOLE (METROCREAM) 0.75 % cream Apply 1 application  topically 2 (two) times daily as needed (skin rash).     Multiple Vitamin (MULTI-VITAMIN) tablet Take 1 tablet by mouth in the morning. Centrum Silver     MYRBETRIQ 25 MG TB24 tablet Take 25 mg by mouth in the morning.     nitroGLYCERIN (NITROSTAT) 0.4 MG SL tablet Place 1 tablet (0.4 mg total) under the tongue every 5 (five) minutes as needed for chest pain. 25 tablet 3   Omega-3 1000 MG CAPS Take 1,000 mg by mouth in the morning.     rosuvastatin (CRESTOR) 40 MG tablet Take 1 tablet (40 mg total) by mouth daily. 90 tablet 3   spironolactone (ALDACTONE) 25 MG tablet Take 1 tablet (25 mg total) by mouth daily. 30 tablet 3   tolterodine (DETROL LA) 4 MG 24 hr capsule Take 4 mg by mouth in the morning.     triamcinolone (KENALOG) 0.025 % cream Apply 1 application  topically 2 (two) times daily as needed (skin irritation).     No current facility-administered  medications for this visit.    Allergies:   Patient has no known allergies.   ROS:  Please see the history of present illness.   Otherwise, review of systems are positive for none.   All other systems are reviewed and negative.    PHYSICAL EXAM: VS:  BP 110/70   Pulse 81   Ht '5\' 10"'$  (1.778 m)   Wt 187 lb 9.6 oz (85.1 kg)   SpO2 98%   BMI 26.92 kg/m  , BMI Body mass index is 26.92 kg/m. GENERAL:  Well appearing NECK:  No jugular venous distention, waveform within normal limits, carotid upstroke brisk and symmetric, no bruits, no thyromegaly LUNGS:  Clear to auscultation bilaterally CHEST:  Unremarkable HEART:  PMI not displaced or sustained,S1 and S2 within normal limits, no S3, no S4, no clicks, no rubs, no murmurs ABD:  Flat, positive bowel sounds normal in frequency in pitch, no bruits, no rebound, no guarding, no midline pulsatile mass, no hepatomegaly, no splenomegaly EXT:  2 plus pulses throughout, no edema, no cyanosis no clubbing  EKG:  EKG is  ordered today. Sinus rhythm, rate 77, axis within normal limits, intervals within normal limits, inferior T wave inversions somewhat improved from previous with resolution of previous ST elevation.  Old anterior infarct.   Recent Labs: 02/05/2022: ALT 76 02/06/2022: Hemoglobin 11.2; Platelets 219 05/11/2022: BUN 18; Creatinine, Ser 0.95; Potassium 4.4; Sodium 138    Lipid Panel    Component Value Date/Time   CHOL 133 05/11/2022 0842   TRIG 134 05/11/2022 0842   HDL 50 05/11/2022 0842   CHOLHDL 2.7 05/11/2022 0842   CHOLHDL 2.4 02/05/2022 1817   VLDL 11 02/05/2022 1817   LDLCALC 60 05/11/2022 0842      Wt Readings from Last 3 Encounters:  05/15/22 187 lb 9.6 oz (85.1 kg)  05/15/22 187 lb 2.7 oz (84.9 kg)  04/10/22 183 lb (83 kg)    Cardiac cath  Diagnostic Dominance: Right Intervention   Other studies Reviewed: Additional studies/ records that were reviewed today include: Labs. Review of the above records  demonstrates:  Please see elsewhere in the note.     ASSESSMENT AND PLAN:  CAD:   He had no new symptoms.  He is participating aggressively in risk reduction.   ICM:   We talked at length about titrating meds and including Entresto.  His blood pressure runs low and he is hesitant to do this but I will repeat  an echo in September and we will rediscuss this if his ejection fraction is still mildly reduced.  He has tolerated the spironolactone and had follow-up labs that were fine.  Complete heart block: This was postevent and has had no symptomatic recurrence of this.\  Hypertension:   This is being managed in the context of treating his CHF   Hyperlipidemia:   LDL done recently was 60 with an HDL of 50.  No change in therapy.  This is improved.   Dilation of ascending aorta:   I will follow this on CT again next year.  Abnormal ABIs: This does not correlate with his exam and I will repeat these in September.  Current medicines are reviewed at length with the patient today.  The patient does not have concerns regarding medicines.  The following changes have been made: None.  Labs/ tests ordered today include:    Orders Placed This Encounter  Procedures   EKG 12-Lead   ECHOCARDIOGRAM COMPLETE   VAS [place] ABI WITH/WO TBI      Disposition:   FU with me in September   Signed, Tim Corriher, MD  05/15/2022 4:54 PM    [facility]

## 2022-05-15 ENCOUNTER — Encounter (HOSPITAL_COMMUNITY)
Admission: RE | Admit: 2022-05-15 | Discharge: 2022-05-15 | Disposition: A | Discharge status: Home / Self Care | Payer: Medicare HMO | Source: Ambulatory Visit | Attending: Cardiology | Admitting: Cardiology

## 2022-05-15 ENCOUNTER — Encounter: Payer: Self-pay | Admitting: Cardiology

## 2022-05-15 ENCOUNTER — Ambulatory Visit (INDEPENDENT_AMBULATORY_CARE_PROVIDER_SITE_OTHER): Payer: Medicare HMO | Admitting: Cardiology

## 2022-05-15 ENCOUNTER — Encounter (HOSPITAL_COMMUNITY): Payer: Self-pay

## 2022-05-15 VITALS — BP 110/70 | HR 81 | Ht 70.0 in | Wt 187.6 lb

## 2022-05-15 VITALS — BP 118/72 | HR 76 | Ht 70.0 in | Wt 187.2 lb

## 2022-05-15 DIAGNOSIS — I25110 Atherosclerotic heart disease of native coronary artery with unstable angina pectoris: Secondary | ICD-10-CM

## 2022-05-15 DIAGNOSIS — R6889 Other general symptoms and signs: Secondary | ICD-10-CM

## 2022-05-15 DIAGNOSIS — I213 ST elevation (STEMI) myocardial infarction of unspecified site: Secondary | ICD-10-CM | POA: Diagnosis present

## 2022-05-15 DIAGNOSIS — I255 Ischemic cardiomyopathy: Secondary | ICD-10-CM

## 2022-05-15 DIAGNOSIS — Z955 Presence of coronary angioplasty implant and graft: Secondary | ICD-10-CM | POA: Insufficient documentation

## 2022-05-15 HISTORY — DX: Atherosclerotic heart disease of native coronary artery without angina pectoris: I25.10

## 2022-05-15 NOTE — Patient Instructions (Signed)
Medication Instructions:  Your physician recommends that you continue on your current medications as directed. Please refer to the Current Medication list given to you today.  *If you need a refill on your cardiac medications before your next appointment, please call your pharmacy*   Testing/Procedures: Your physician has requested that you have an echocardiogram in September 2023. Echocardiography is a painless test that uses sound waves to create images of your heart. It provides your doctor with information about the size and shape of your heart and how well your heart's chambers and valves are working. This procedure takes approximately one hour. There are no restrictions for this procedure. This is done at [facility] ([address] [employer])  Your physician has ordered an ABI study which assess blood flow to legs. This is done at Dr. Rosezella [place] office in September 2023.    Follow-Up: At [facility], you and your health needs are our priority.  As part of our continuing mission to provide you with exceptional heart care, we have created designated Provider Care Teams.  These Care Teams include your primary Cardiologist (physician) and Advanced Practice Providers (APPs -  Physician Assistants and Nurse Practitioners) who all work together to provide you with the care you need, when you need it.  We recommend signing up for the patient portal called "MyChart".  Sign up information is provided on this After Visit Summary.  MyChart is used to connect with patients for Virtual Visits (Telemedicine).  Patients are able to view lab/test results, encounter notes, upcoming appointments, etc.  Non-urgent messages can be sent to your provider as well.   To learn more about what you can do with MyChart, go to [email or web address].    Your next appointment:    September 2023 with Dr. Percival Spanish - after testing

## 2022-05-15 NOTE — Progress Notes (Signed)
Cardiac Rehab Medication Review by a Nurse  Does the patient  feel that his/her medications are working for him/her?  yes  Has the patient been experiencing any side effects to the medications prescribed?  no  Does the patient measure his/her own blood pressure or blood glucose at home?  yes   Does the patient have any problems obtaining medications due to transportation or finances?   no  Understanding of regimen: excellent Understanding of indications: excellent Potential of compliance: excellent    Nurse comments: Miguel Walker is taking his medications as prescribed and has a good understanding of what his medications are for.    Christa See [facility] RN 05/15/2022 4:14 PM

## 2022-05-15 NOTE — Progress Notes (Signed)
Cardiac Individual Treatment Plan  Patient Details  Name: Miguel Walker MRN: [id number] Date of Birth: October 22, 1941 Referring Provider:   Flowsheet Row INTENSIVE CARDIAC REHAB ORIENT from 05/15/2022 in [place]  Referring Provider Minus Breeding, MD       Initial Encounter Date:  [facility] from 05/15/2022 in [place]  Date 05/15/22       Visit Diagnosis: 02/05/22 STEMI  02/05/22 DES x3  Patient's Home Medications on Admission:  Current Outpatient Medications:    ascorbic acid (VITAMIN C) 500 MG tablet, Take 500 mg by mouth in the morning., Disp: , Rfl:    aspirin EC 81 MG tablet, Take 81 mg by mouth in the morning., Disp: , Rfl:    BRILINTA 90 MG TABS tablet, TAKE 1 TABLET BY MOUTH TWICE  DAILY, Disp: 180 tablet, Rfl: 3   FARXIGA 10 MG TABS tablet, TAKE 1 TABLET BY MOUTH DAILY, Disp: 90 tablet, Rfl: 3   hydrocortisone 2.5 % cream, Apply 1 application  topically 2 (two) times daily as needed (irritation.)., Disp: , Rfl:    metroNIDAZOLE (METROCREAM) 0.75 % cream, Apply 1 application  topically 2 (two) times daily as needed (skin rash)., Disp: , Rfl:    Multiple Vitamin (MULTI-VITAMIN) tablet, Take 1 tablet by mouth in the morning. Centrum Silver, Disp: , Rfl:    MYRBETRIQ 25 MG TB24 tablet, Take 25 mg by mouth in the morning., Disp: , Rfl:    nitroGLYCERIN (NITROSTAT) 0.4 MG SL tablet, Place 1 tablet (0.4 mg total) under the tongue every 5 (five) minutes as needed for chest pain., Disp: 25 tablet, Rfl: 3   Omega-3 1000 MG CAPS, Take 1,000 mg by mouth in the morning., Disp: , Rfl:    rosuvastatin (CRESTOR) 40 MG tablet, Take 1 tablet (40 mg total) by mouth daily., Disp: 90 tablet, Rfl: 3   spironolactone (ALDACTONE) 25 MG tablet, Take 1 tablet (25 mg total) by mouth daily., Disp: 30 tablet, Rfl: 3   tolterodine (DETROL LA) 4 MG 24 hr capsule, Take 4 mg by mouth in the morning., Disp:  , Rfl:    triamcinolone (KENALOG) 0.025 % cream, Apply 1 application  topically 2 (two) times daily as needed (skin irritation)., Disp: , Rfl:   Past Medical History: Past Medical History:  Diagnosis Date   Aneurysm, ascending aorta ([place])    Cancer ([place]) 1997   Prostate cancer   Coronary artery disease    Dyslipidemia    Family history of premature CAD    NO ISCHEMIC SYMPTOMS   Hyperlipidemia    Hypertension    Thyroid nodule     Tobacco Use: Social History   Tobacco Use  Smoking Status Never  Smokeless Tobacco Never    Labs: Review Flowsheet  More data may exist      Latest Ref Rng & Units 01/15/2020 08/22/2020 09/06/2021 02/05/2022  Labs for ITP Cardiac and Pulmonary Rehab  Cholestrol 100 - 199 mg/dL 148  148  165  91   LDL (calc) 0 - 99 mg/dL 76  73  90  42   HDL-C >39 mg/dL 46  56  49  38   Trlycerides 0 - 149 mg/dL 152  104  151  55   Hemoglobin A1c 4.8 - 5.6 % - 5.6  5.7  5.4   TCO2 22 - 32 mmol/L - - - 25       05/11/2022  Labs for ITP  Cardiac and Pulmonary Rehab  Cholestrol 133   LDL (calc) 60   HDL-C 50   Trlycerides 134   Hemoglobin A1c -  TCO2 -    Capillary Blood Glucose: Lab Results  Component Value Date   GLUCAP 117 (H) 02/05/2022     Exercise Target Goals: Exercise Program Goal: Individual exercise prescription set using results from initial 6 min walk test and THRR while considering  patient's activity barriers and safety.   Exercise Prescription Goal: Initial exercise prescription builds to 30-45 minutes a day of aerobic activity, 2-3 days per week.  Home exercise guidelines will be given to patient during program as part of exercise prescription that the participant will acknowledge.  Activity Barriers & Risk Stratification:  Activity Barriers & Cardiac Risk Stratification - 05/15/22 1012       Activity Barriers & Cardiac Risk Stratification   Activity Barriers None    Cardiac Risk Stratification High             6 Minute  Walk:  6 Minute Walk     Row Name 05/15/22 0958         6 Minute Walk   Phase Initial     Distance 1813 feet     Walk Time 6 minutes     # of Rest Breaks 0     MPH 3.43     METS 3.63     RPE 11     Perceived Dyspnea  0     VO2 Peak 12.7     Symptoms No     Resting HR 76 bpm     Resting BP 118/72     Resting Oxygen Saturation  97 %     Exercise Oxygen Saturation  during 6 min walk 99 %     Max Ex. HR 110 bpm     Max Ex. BP 148/72     2 Minute Post BP 118/72              Oxygen Initial Assessment:   Oxygen Re-Evaluation:   Oxygen Discharge (Final Oxygen Re-Evaluation):   Initial Exercise Prescription:  Initial Exercise Prescription - 05/15/22 1000       Date of Initial Exercise RX and Referring Provider   Date 05/15/22    Referring Provider Minus Breeding, MD    Expected Discharge Date 07/13/22      Treadmill   MPH 2.5    Grade 0    Minutes 15    METs 2.91      NuStep   Level 3    SPM 85    Minutes 15    METs 2.8      Prescription Details   Frequency (times per week) 3    Duration Progress to 30 minutes of continuous aerobic without signs/symptoms of physical distress      Intensity   THRR 40-80% of Max Heartrate 56-113    Ratings of Perceived Exertion 11-13    Perceived Dyspnea 0-4      Progression   Progression Continue to progress workloads to maintain intensity without signs/symptoms of physical distress.      Resistance Training   Training Prescription Yes    Weight 5 lbs    Reps 10-15             Perform Capillary Blood Glucose checks as needed.  Exercise Prescription Changes:   Exercise Comments:   Exercise Goals and Review:   Exercise Goals     Row Name 05/15/22 0940  Exercise Goals   Increase Physical Activity Yes       Intervention Provide advice, education, support and counseling about physical activity/exercise needs.;Develop an individualized exercise prescription for aerobic and resistive  training based on initial evaluation findings, risk stratification, comorbidities and participant's personal goals.       Expected Outcomes Short Term: Attend rehab on a regular basis to increase amount of physical activity.;Long Term: Exercising regularly at least 3-5 days a week.       Increase Strength and Stamina Yes       Intervention Provide advice, education, support and counseling about physical activity/exercise needs.;Develop an individualized exercise prescription for aerobic and resistive training based on initial evaluation findings, risk stratification, comorbidities and participant's personal goals.       Expected Outcomes Short Term: Increase workloads from initial exercise prescription for resistance, speed, and METs.;Short Term: Perform resistance training exercises routinely during rehab and add in resistance training at home;Long Term: Improve cardiorespiratory fitness, muscular endurance and strength as measured by increased METs and functional capacity (6MWT)       Able to understand and use rate of perceived exertion (RPE) scale Yes       Intervention Provide education and explanation on how to use RPE scale       Expected Outcomes Short Term: Able to use RPE daily in rehab to express subjective intensity level;Long Term:  Able to use RPE to guide intensity level when exercising independently       Knowledge and understanding of Target Heart Rate Range (THRR) Yes       Intervention Provide education and explanation of THRR including how the numbers were predicted and where they are located for reference       Expected Outcomes Short Term: Able to state/look up THRR;Long Term: Able to use THRR to govern intensity when exercising independently;Short Term: Able to use daily as guideline for intensity in rehab       Able to check pulse independently Yes       Intervention Provide education and demonstration on how to check pulse in carotid and radial arteries.;Review the importance of  being able to check your own pulse for safety during independent exercise       Expected Outcomes Short Term: Able to explain why pulse checking is important during independent exercise;Long Term: Able to check pulse independently and accurately       Understanding of Exercise Prescription Yes       Intervention Provide education, explanation, and written materials on patient's individual exercise prescription       Expected Outcomes Short Term: Able to explain program exercise prescription;Long Term: Able to explain home exercise prescription to exercise independently                Exercise Goals Re-Evaluation :   Discharge Exercise Prescription (Final Exercise Prescription Changes):   Nutrition:  Target Goals: Understanding of nutrition guidelines, daily intake of sodium '1500mg'$ , cholesterol '200mg'$ , calories 30% from fat and 7% or less from saturated fats, daily to have 5 or more servings of fruits and vegetables.  Biometrics:  Pre Biometrics - 05/15/22 0903       Pre Biometrics   Waist Circumference 36.5 inches    Hip Circumference 42 inches    Waist to Hip Ratio 0.87 %    Triceps Skinfold 16 mm    % Body Fat 26 %    Grip Strength 26 kg    Flexibility 9.5 in    Single Leg  Stand 10.5 seconds              Nutrition Therapy Plan and Nutrition Goals:   Nutrition Assessments:  MEDIFICTS Score Key: ?70 Need to make dietary changes  40-70 Heart Healthy Diet ? 40 Therapeutic Level Cholesterol Diet    Picture Your Plate Scores: <47 Unhealthy dietary pattern with much room for improvement. 41-50 Dietary pattern unlikely to meet recommendations for good health and room for improvement. 51-60 More healthful dietary pattern, with some room for improvement.  >60 Healthy dietary pattern, although there may be some specific behaviors that could be improved.    Nutrition Goals Re-Evaluation:   Nutrition Goals Re-Evaluation:   Nutrition Goals Discharge (Final  Nutrition Goals Re-Evaluation):   Psychosocial: Target Goals: Acknowledge presence or absence of significant depression and/or stress, maximize coping skills, provide positive support system. Participant is able to verbalize types and ability to use techniques and skills needed for reducing stress and depression.  Initial Review & Psychosocial Screening:  Initial Psych Review & Screening - 05/15/22 1606       Initial Review   Current issues with Current Stress Concerns    Source of Stress Concerns Family    Comments Fraser Din has a disabled son who lives in [place].      Family Dynamics   Good Support System? Yes   Fraser Din has his wife and sons for support            Quality of Life Scores:  Quality of Life - 05/15/22 1529       Quality of Life   Select Quality of Life      Quality of Life Scores   Health/Function Pre 24.47 %    Socioeconomic Pre 27.21 %    Psych/Spiritual Pre 26.57 %    Family Pre 23.8 %    GLOBAL Pre 25.37 %            Scores of 19 and below usually indicate a poorer quality of life in these areas.  A difference of  2-3 points is a clinically meaningful difference.  A difference of 2-3 points in the total score of the Quality of Life Index has been associated with significant improvement in overall quality of life, self-image, physical symptoms, and general health in studies assessing change in quality of life.  PHQ-9: Review Flowsheet       05/15/2022  Depression screen PHQ 2/9  Decreased Interest 0  Down, Depressed, Hopeless 0  PHQ - 2 Score 0   Interpretation of Total Score  Total Score Depression Severity:  1-4 = Minimal depression, 5-9 = Mild depression, 10-14 = Moderate depression, 15-19 = Moderately severe depression, 20-27 = Severe depression   Psychosocial Evaluation and Intervention:   Psychosocial Re-Evaluation:   Psychosocial Discharge (Final Psychosocial Re-Evaluation):   Vocational Rehabilitation: Provide vocational rehab  assistance to qualifying candidates.   Vocational Rehab Evaluation & Intervention:  Vocational Rehab - 05/15/22 1611       Initial Vocational Rehab Evaluation & Intervention   Assessment shows need for Vocational Rehabilitation No   Fraser Din is retired and does not need vocational rehab at this time            Education: Education Goals: Education classes will be provided on a weekly basis, covering required topics. Participant will state understanding/return demonstration of topics presented.     Core Videos: Exercise    Move It!  Clinical staff conducted group or individual video education with verbal and written material and  guidebook.  Patient learns the recommended Pritikin exercise program. Exercise with the goal of living a long, healthy life. Some of the health benefits of exercise include controlled diabetes, healthier blood pressure levels, improved cholesterol levels, improved heart and lung capacity, improved sleep, and better body composition. Everyone should speak with their doctor before starting or changing an exercise routine.  Biomechanical Limitations Clinical staff conducted group or individual video education with verbal and written material and guidebook.  Patient learns how biomechanical limitations can impact exercise and how we can mitigate and possibly overcome limitations to have an impactful and balanced exercise routine.  Body Composition Clinical staff conducted group or individual video education with verbal and written material and guidebook.  Patient learns that body composition (ratio of muscle mass to fat mass) is a key component to assessing overall fitness, rather than body weight alone. Increased fat mass, especially visceral belly fat, can put [place] at increased risk for metabolic syndrome, type 2 diabetes, heart disease, and even death. It is recommended to combine diet and exercise (cardiovascular and resistance training) to improve your body  composition. Seek guidance from your physician and exercise physiologist before implementing an exercise routine.  Exercise Action Plan Clinical staff conducted group or individual video education with verbal and written material and guidebook.  Patient learns the recommended strategies to achieve and enjoy long-term exercise adherence, including variety, self-motivation, self-efficacy, and positive decision making. Benefits of exercise include fitness, good health, weight management, more energy, better sleep, less stress, and overall well-being.  Medical   Heart Disease Risk Reduction Clinical staff conducted group or individual video education with verbal and written material and guidebook.  Patient learns our heart is our most vital organ as it circulates oxygen, nutrients, white blood cells, and hormones throughout the entire body, and carries waste away. Data supports a plant-based eating plan like the Pritikin Program for its effectiveness in slowing progression of and reversing heart disease. The video provides a number of recommendations to address heart disease.   Metabolic Syndrome and Belly Fat  Clinical staff conducted group or individual video education with verbal and written material and guidebook.  Patient learns what metabolic syndrome is, how it leads to heart disease, and how one can reverse it and keep it from coming back. You have metabolic syndrome if you have 3 of the following 5 criteria: abdominal obesity, high blood pressure, high triglycerides, low HDL cholesterol, and high blood sugar.  Hypertension and Heart Disease Clinical staff conducted group or individual video education with verbal and written material and guidebook.  Patient learns that high blood pressure, or hypertension, is very common in the [place]. Hypertension is largely due to excessive salt intake, but other important risk factors include being overweight, physical inactivity, drinking too much  alcohol, smoking, and not eating enough potassium from fruits and vegetables. High blood pressure is a leading risk factor for heart attack, stroke, congestive heart failure, dementia, kidney failure, and premature death. Long-term effects of excessive salt intake include stiffening of the arteries and thickening of heart muscle and organ damage. Recommendations include ways to reduce hypertension and the risk of heart disease.  Diseases of Our Time - Focusing on Diabetes Clinical staff conducted group or individual video education with verbal and written material and guidebook.  Patient learns why the best way to stop diseases of our time is prevention, through food and other lifestyle changes. Medicine (such as prescription pills and surgeries) is often only a Band-Aid on the problem,  not a long-term solution. Most common diseases of our time include obesity, type 2 diabetes, hypertension, heart disease, and cancer. The Pritikin Program is recommended and has been proven to help reduce, reverse, and/or prevent the damaging effects of metabolic syndrome.  Nutrition   Overview of the Pritikin Eating Plan  Clinical staff conducted group or individual video education with verbal and written material and guidebook.  Patient learns about the [place] for disease risk reduction. The [place] emphasizes a wide variety of unrefined, minimally-processed carbohydrates, like fruits, vegetables, whole grains, and legumes. Go, Caution, and Stop food choices are explained. Plant-based and lean animal proteins are emphasized. Rationale provided for low sodium intake for blood pressure control, low added sugars for blood sugar stabilization, and low added fats and oils for coronary artery disease risk reduction and weight management.  Calorie Density  Clinical staff conducted group or individual video education with verbal and written material and guidebook.  Patient learns about calorie  density and how it impacts the Pritikin Eating Plan. Knowing the characteristics of the food you choose will help you decide whether those foods will lead to weight gain or weight loss, and whether you want to consume more or less of them. Weight loss is usually a side effect of the Pritikin Eating Plan because of its focus on low calorie-dense foods.  Label Reading  Clinical staff conducted group or individual video education with verbal and written material and guidebook.  Patient learns about the Pritikin recommended label reading guidelines and corresponding recommendations regarding calorie density, added sugars, sodium content, and whole grains.  Dining Out - Part 1  Clinical staff conducted group or individual video education with verbal and written material and guidebook.  Patient learns that restaurant meals can be sabotaging because they can be so high in calories, fat, sodium, and/or sugar. Patient learns recommended strategies on how to positively address this and avoid unhealthy pitfalls.  Facts on Fats  Clinical staff conducted group or individual video education with verbal and written material and guidebook.  Patient learns that lifestyle modifications can be just as effective, if not more so, as many medications for lowering your risk of heart disease. A Pritikin lifestyle can help to reduce your risk of inflammation and atherosclerosis (cholesterol build-up, or plaque, in the artery walls). Lifestyle interventions such as dietary choices and physical activity address the cause of atherosclerosis. A review of the types of fats and their impact on blood cholesterol levels, along with dietary recommendations to reduce fat intake is also included.  Nutrition Action Plan  Clinical staff conducted group or individual video education with verbal and written material and guidebook.  Patient learns how to incorporate Pritikin recommendations into their lifestyle. Recommendations include  planning and keeping personal health goals in mind as an important part of their success.  Healthy Mind-Set    Healthy Minds, Bodies, Hearts  Clinical staff conducted group or individual video education with verbal and written material and guidebook.  Patient learns how to identify when they are stressed. Video will discuss the impact of that stress, as well as the many benefits of stress management. Patient will also be introduced to stress management techniques. The way we think, act, and feel has an impact on our hearts.  How Our Thoughts Can Heal Our Hearts  Clinical staff conducted group or individual video education with verbal and written material and guidebook.  Patient learns that negative thoughts can cause depression and anxiety. This can result in  negative lifestyle behavior and serious health problems. Cognitive behavioral therapy is an effective method to help control our thoughts in order to change and improve our emotional outlook.  Additional Videos:  Exercise    Improving Performance  Clinical staff conducted group or individual video education with verbal and written material and guidebook.  Patient learns to use a non-linear approach by alternating intensity levels and lengths of time spent exercising to help burn more calories and lose more body fat. Cardiovascular exercise helps improve heart health, metabolism, hormonal balance, blood sugar control, and recovery from fatigue. Resistance training improves strength, endurance, balance, coordination, reaction time, metabolism, and muscle mass. Flexibility exercise improves circulation, posture, and balance. Seek guidance from your physician and exercise physiologist before implementing an exercise routine and learn your capabilities and proper form for all exercise.  Introduction to Yoga  Clinical staff conducted group or individual video education with verbal and written material and guidebook.  Patient learns about yoga, a  discipline of the coming together of mind, breath, and body. The benefits of yoga include improved flexibility, improved range of motion, better posture and core strength, increased lung function, weight loss, and positive self-image. Yoga's heart health benefits include lowered blood pressure, healthier heart rate, decreased cholesterol and triglyceride levels, improved immune function, and reduced stress. Seek guidance from your physician and exercise physiologist before implementing an exercise routine and learn your capabilities and proper form for all exercise.  Medical   Aging: Enhancing Your Quality of Life  Clinical staff conducted group or individual video education with verbal and written material and guidebook.  Patient learns key strategies and recommendations to stay in good physical health and enhance quality of life, such as prevention strategies, having an advocate, securing a [place], and keeping a list of medications and system for tracking them. It also discusses how to avoid risk for bone loss.  Biology of Weight Control  Clinical staff conducted group or individual video education with verbal and written material and guidebook.  Patient learns that weight gain occurs because we consume more calories than we burn (eating more, moving less). Even if your body weight is normal, you may have higher ratios of fat compared to muscle mass. Too much body fat puts you at increased risk for cardiovascular disease, heart attack, stroke, type 2 diabetes, and obesity-related cancers. In addition to exercise, following the [place] can help reduce your risk.  Decoding Lab Results  Clinical staff conducted group or individual video education with verbal and written material and guidebook.  Patient learns that lab test reflects one measurement whose values change over time and are influenced by many factors, including medication, stress, sleep, exercise,  food, hydration, pre-existing medical conditions, and more. It is recommended to use the knowledge from this video to become more involved with your lab results and evaluate your numbers to speak with your doctor.   Diseases of Our Time - Overview  Clinical staff conducted group or individual video education with verbal and written material and guidebook.  Patient learns that according to the CDC, 50% to 70% of chronic diseases (such as obesity, type 2 diabetes, elevated lipids, hypertension, and heart disease) are avoidable through lifestyle improvements including healthier food choices, listening to satiety cues, and increased physical activity.  Sleep Disorders Clinical staff conducted group or individual video education with verbal and written material and guidebook.  Patient learns how good quality and duration of sleep are important to overall  health and well-being. Patient also learns about sleep disorders and how they impact health along with recommendations to address them, including discussing with a physician.  Nutrition  Dining Out - Part 2 Clinical staff conducted group or individual video education with verbal and written material and guidebook.  Patient learns how to plan ahead and communicate in order to maximize their dining experience in a healthy and nutritious manner. Included are recommended food choices based on the type of restaurant the patient is visiting.   Fueling a Best boy conducted group or individual video education with verbal and written material and guidebook.  There is a strong connection between our food choices and our health. Diseases like obesity and type 2 diabetes are very prevalent and are in large-part due to lifestyle choices. The Pritikin Eating Plan provides plenty of food and hunger-curbing satisfaction. It is easy to follow, affordable, and helps reduce health risks.  Menu Workshop  Clinical staff conducted group or individual  video education with verbal and written material and guidebook.  Patient learns that restaurant meals can sabotage health goals because they are often packed with calories, fat, sodium, and sugar. Recommendations include strategies to plan ahead and to communicate with the manager, chef, or server to help order a healthier meal.  Planning Your Eating Strategy  Clinical staff conducted group or individual video education with verbal and written material and guidebook.  Patient learns about the [place] and its benefit of reducing the risk of disease. The [place] does not focus on calories. Instead, it emphasizes high-quality, nutrient-rich foods. By knowing the characteristics of the foods, we choose, we can determine their calorie density and make informed decisions.  Targeting Your Nutrition Priorities  Clinical staff conducted group or individual video education with verbal and written material and guidebook.  Patient learns that lifestyle habits have a tremendous impact on disease risk and progression. This video provides eating and physical activity recommendations based on your personal health goals, such as reducing LDL cholesterol, losing weight, preventing or controlling type 2 diabetes, and reducing high blood pressure.  Vitamins and Minerals  Clinical staff conducted group or individual video education with verbal and written material and guidebook.  Patient learns different ways to obtain key vitamins and minerals, including through a recommended healthy diet. It is important to discuss all supplements you take with your doctor.   Healthy Mind-Set    Smoking Cessation  Clinical staff conducted group or individual video education with verbal and written material and guidebook.  Patient learns that cigarette smoking and tobacco addiction pose a serious health risk which affects millions of people. Stopping smoking will significantly reduce the risk of heart  disease, lung disease, and many forms of cancer. Recommended strategies for quitting are covered, including working with your doctor to develop a successful plan.  Culinary   Becoming a Financial trader conducted group or individual video education with verbal and written material and guidebook.  Patient learns that cooking at home can be healthy, cost-effective, quick, and puts them in control. Keys to cooking healthy recipes will include looking at your recipe, assessing your equipment needs, planning ahead, making it simple, choosing cost-effective seasonal ingredients, and limiting the use of added fats, salts, and sugars.  Cooking - Breakfast and Snacks  Clinical staff conducted group or individual video education with verbal and written material and guidebook.  Patient learns how important breakfast is to satiety and nutrition through the entire  day. Recommendations include key foods to eat during breakfast to help stabilize blood sugar levels and to prevent overeating at meals later in the day. Planning ahead is also a key component.  Cooking - Human resources officer conducted group or individual video education with verbal and written material and guidebook.  Patient learns eating strategies to improve overall health, including an approach to cook more at home. Recommendations include thinking of animal protein as a side on your plate rather than center stage and focusing instead on lower calorie dense options like vegetables, fruits, whole grains, and plant-based proteins, such as beans. Making sauces in large quantities to freeze for later and leaving the skin on your vegetables are also recommended to maximize your experience.  Cooking - Healthy Salads and Dressing Clinical staff conducted group or individual video education with verbal and written material and guidebook.  Patient learns that vegetables, fruits, whole grains, and legumes are the foundations of the  [place]. Recommendations include how to incorporate each of these in flavorful and healthy salads, and how to create homemade salad dressings. Proper handling of ingredients is also covered. Cooking - Soups and [employer] - Soups and Desserts Clinical staff conducted group or individual video education with verbal and written material and guidebook.  Patient learns that Pritikin soups and desserts make for easy, nutritious, and delicious snacks and meal components that are low in sodium, fat, sugar, and calorie density, while high in vitamins, minerals, and filling fiber. Recommendations include simple and healthy ideas for soups and desserts.   Overview     The Pritikin Solution Program Overview Clinical staff conducted group or individual video education with verbal and written material and guidebook.  Patient learns that the results of the [place] Program have been documented in more than 100 articles published in peer-reviewed journals, and the benefits include reducing risk factors for (and, in some cases, even reversing) high cholesterol, high blood pressure, type 2 diabetes, obesity, and more! An overview of the three key pillars of the Pritikin Program will be covered: eating well, doing regular exercise, and having a healthy mind-set.  WORKSHOPS  Exercise: Exercise Basics: Building Your Action Plan Clinical staff led group instruction and group discussion with PowerPoint presentation and patient guidebook. To enhance the learning environment the use of posters, models and videos may be added. At the conclusion of this workshop, patients will comprehend the difference between physical activity and exercise, as well as the benefits of incorporating both, into their routine. Patients will understand the FITT (Frequency, Intensity, Time, and Type) principle and how to use it to build an exercise action plan. In addition, safety concerns and other considerations for  exercise and cardiac rehab will be addressed by the presenter. The purpose of this lesson is to promote a comprehensive and effective weekly exercise routine in order to improve patients' overall level of fitness.   Managing Heart Disease: Your Path to a Healthier Heart Clinical staff led group instruction and group discussion with PowerPoint presentation and patient guidebook. To enhance the learning environment the use of posters, models and videos may be added.At the conclusion of this workshop, patients will understand the anatomy and physiology of the heart. Additionally, they will understand how Pritikin's three pillars impact the risk factors, the progression, and the management of heart disease.  The purpose of this lesson is to provide a high-level overview of the heart, heart disease, and how the Pritikin lifestyle positively impacts risk factors.  Exercise Biomechanics Clinical staff led group instruction and group discussion with PowerPoint presentation and patient guidebook. To enhance the learning environment the use of posters, models and videos may be added. Patients will learn how the structural parts of their bodies function and how these functions impact their daily activities, movement, and exercise. Patients will learn how to promote a neutral spine, learn how to manage pain, and identify ways to improve their physical movement in order to promote healthy living. The purpose of this lesson is to expose patients to common physical limitations that impact physical activity. Participants will learn practical ways to adapt and manage aches and pains, and to minimize their effect on regular exercise. Patients will learn how to maintain good posture while sitting, walking, and lifting.  Balance Training and Fall Prevention  Clinical staff led group instruction and group discussion with PowerPoint presentation and patient guidebook. To enhance the learning environment the use of  posters, models and videos may be added. At the conclusion of this workshop, patients will understand the importance of their sensorimotor skills (vision, proprioception, and the vestibular system) in maintaining their ability to balance as they age. Patients will apply a variety of balancing exercises that are appropriate for their current level of function. Patients will understand the common causes for poor balance, possible solutions to these problems, and ways to modify their physical environment in order to minimize their fall risk. The purpose of this lesson is to teach patients about the importance of maintaining balance as they age and ways to minimize their risk of falling.  WORKSHOPS   Nutrition:  Fueling a Scientist, research (physical sciences) led group instruction and group discussion with PowerPoint presentation and patient guidebook. To enhance the learning environment the use of posters, models and videos may be added. Patients will review the foundational principles of the [place] and understand what constitutes a serving size in each of the food groups. Patients will also learn Pritikin-friendly foods that are better choices when away from home and review make-ahead meal and snack options. Calorie density will be reviewed and applied to three nutrition priorities: weight maintenance, weight loss, and weight gain. The purpose of this lesson is to reinforce (in a group setting) the key concepts around what patients are recommended to eat and how to apply these guidelines when away from home by planning and selecting Pritikin-friendly options. Patients will understand how calorie density may be adjusted for different weight management goals.  Mindful Eating  Clinical staff led group instruction and group discussion with PowerPoint presentation and patient guidebook. To enhance the learning environment the use of posters, models and videos may be added. Patients will briefly review the  concepts of the [place] and the importance of low-calorie dense foods. The concept of mindful eating will be introduced as well as the importance of paying attention to internal hunger signals. Triggers for non-hunger eating and techniques for dealing with triggers will be explored. The purpose of this lesson is to provide patients with the opportunity to review the basic principles of the [place], discuss the value of eating mindfully and how to measure internal cues of hunger and fullness using the Hunger Scale. Patients will also discuss reasons for non-hunger eating and learn strategies to use for controlling emotional eating.  Targeting Your Nutrition Priorities Clinical staff led group instruction and group discussion with PowerPoint presentation and patient guidebook. To enhance the learning environment the use of posters, models and videos may be  added. Patients will learn how to determine their genetic susceptibility to disease by reviewing their family history. Patients will gain insight into the importance of diet as part of an overall healthy lifestyle in mitigating the impact of genetics and other environmental insults. The purpose of this lesson is to provide patients with the opportunity to assess their personal nutrition priorities by looking at their family history, their own health history and current risk factors. Patients will also be able to discuss ways of prioritizing and modifying the [place] for their highest risk areas  Menu  Clinical staff led group instruction and group discussion with PowerPoint presentation and patient guidebook. To enhance the learning environment the use of posters, models and videos may be added. Using menus brought in from [employer], or printed from [employer], patients will apply the [place] dining out guidelines that were presented in the [employer] video. Patients will also be able to  practice these guidelines in a variety of provided scenarios. The purpose of this lesson is to provide patients with the opportunity to practice hands-on learning of the [place] with actual menus and practice scenarios.  Label Reading Clinical staff led group instruction and group discussion with PowerPoint presentation and patient guidebook. To enhance the learning environment the use of posters, models and videos may be added. Patients will review and discuss the Pritikin label reading guidelines presented in Pritikin's Label Reading Educational series video. Using fool labels brought in from local grocery stores and markets, patients will apply the label reading guidelines and determine if the packaged food meet the Pritikin guidelines. The purpose of this lesson is to provide patients with the opportunity to review, discuss, and practice hands-on learning of the Pritikin Label Reading guidelines with actual packaged food labels. [place] Workshops are designed to teach patients ways to prepare quick, simple, and affordable recipes at home. The importance of nutrition's role in chronic disease risk reduction is reflected in its emphasis in the overall Pritikin program. By learning how to prepare essential core Pritikin Eating Plan recipes, patients will increase control over what they eat; be able to customize the flavor of foods without the use of added salt, sugar, or fat; and improve the quality of the food they consume. By learning a set of core recipes which are easily assembled, quickly prepared, and affordable, patients are more likely to prepare more healthy foods at home. These workshops focus on convenient breakfasts, simple entres, side dishes, and desserts which can be prepared with minimal effort and are consistent with nutrition recommendations for cardiovascular risk reduction. Cooking [employer] are taught by a Teacher, music (RD) who has been trained by the [employer]. The chef or RD has a clear understanding of the importance of minimizing - if not completely eliminating - added fat, sugar, and sodium in recipes. Throughout the series of [place] Workshop sessions, patients will learn about healthy ingredients and efficient methods of cooking to build confidence in their capability to prepare    Cooking School weekly topics:  Adding Flavor- Sodium-Free  Fast and Healthy Breakfasts  Powerhouse Plant-Based Proteins  Satisfying Salads and Dressings  Simple Sides and Sauces  International Cuisine-Spotlight on the [place] Zones  Delicious Desserts  Savory Soups  [employer] - Meals in a Agricultural consultant Appetizers and Snacks  Comforting Weekend Breakfasts  One-Pot Wonders   Fast Evening Meals  Easy Entertaining  Personalizing Your Pritikin Plate  WORKSHOPS   Healthy Mindset (Psychosocial): New Thoughts, New Behaviors Clinical staff led group instruction and group discussion with PowerPoint presentation and patient guidebook. To enhance the learning environment the use of posters, models and videos may be added. Patients will learn and practice techniques for developing effective health and lifestyle goals. Patients will be able to effectively apply the goal setting process learned to develop at least one new personal goal.  The purpose of this lesson is to expose patients to a new skill set of behavior modification techniques such as techniques setting SMART goals, overcoming barriers, and achieving new thoughts and new behaviors.  Managing Moods and Relationships Clinical staff led group instruction and group discussion with PowerPoint presentation and patient guidebook. To enhance the learning environment the use of posters, models and videos may be added. Patients will learn how emotional and chronic stress factors can impact their health and relationships. They will learn  healthy ways to manage their moods and utilize positive coping mechanisms. In addition, ICR patients will learn ways to improve communication skills. The purpose of this lesson is to expose patients to ways of understanding how one's mood and health are intimately connected. Developing a healthy outlook can help build positive relationships and connections with others. Patients will understand the importance of utilizing effective communication skills that include actively listening and being heard. They will learn and understand the importance of the "4 Cs" and especially Connections in fostering of a Healthy Mind-Set.  Healthy Sleep for a Healthy Heart Clinical staff led group instruction and group discussion with PowerPoint presentation and patient guidebook. To enhance the learning environment the use of posters, models and videos may be added. At the conclusion of this workshop, patients will be able to demonstrate knowledge of the importance of sleep to overall health, well-being, and quality of life. They will understand the symptoms of, and treatments for, common sleep disorders. Patients will also be able to identify daytime and nighttime behaviors which impact sleep, and they will be able to apply these tools to help manage sleep-related challenges. The purpose of this lesson is to provide patients with a general overview of sleep and outline the importance of quality sleep. Patients will learn about a few of the most common sleep disorders. Patients will also be introduced to the concept of "sleep hygiene," and discover ways to self-manage certain sleeping problems through simple daily behavior changes. Finally, the workshop will motivate patients by clarifying the links between quality sleep and their goals of heart-healthy living.   Recognizing and Reducing Stress Clinical staff led group instruction and group discussion with PowerPoint presentation and patient guidebook. To enhance the learning  environment the use of posters, models and videos may be added. At the conclusion of this workshop, patients will be able to understand the types of stress reactions, differentiate between acute and chronic stress, and recognize the impact that chronic stress has on their health. They will also be able to apply different coping mechanisms, such as reframing negative self-talk. Patients will have the opportunity to practice a variety of stress management techniques, such as deep abdominal breathing, progressive muscle relaxation, and/or guided imagery.  The purpose of this lesson is to educate patients on the role of stress in their lives and to provide healthy techniques for coping with it.  Learning Barriers/Preferences:  Learning Barriers/Preferences - 05/15/22 1609       Learning Barriers/Preferences   Learning Barriers Sight;Hearing   Wears glasses has bilateral  hearing aides   Learning Preferences Skilled Demonstration;Pictoral;Video             Education Topics:  Knowledge Questionnaire Score:  Knowledge Questionnaire Score - 05/15/22 1537       Knowledge Questionnaire Score   Pre Score 23/24             Core Components/Risk Factors/Patient Goals at Admission:  Personal Goals and Risk Factors at Admission - 05/15/22 0938       Core Components/Risk Factors/Patient Goals on Admission   Hypertension Yes    Intervention Provide education on lifestyle modifcations including regular physical activity/exercise, weight management, moderate sodium restriction and increased consumption of fresh fruit, vegetables, and low fat dairy, alcohol moderation, and smoking cessation.;Monitor prescription use compliance.    Expected Outcomes Short Term: Continued assessment and intervention until BP is < 140/66m HG in hypertensive participants. < 130/84mHG in hypertensive participants with diabetes, heart failure or chronic kidney disease.;Long Term: Maintenance of blood pressure at goal  levels.    Lipids Yes    Intervention Provide education and support for participant on nutrition & aerobic/resistive exercise along with prescribed medications to achieve LDL '70mg'$ , HDL >'40mg'$ .    Expected Outcomes Short Term: Participant states understanding of desired cholesterol values and is compliant with medications prescribed. Participant is following exercise prescription and nutrition guidelines.;Long Term: Cholesterol controlled with medications as prescribed, with individualized exercise RX and with personalized nutrition plan. Value goals: LDL < '70mg'$ , HDL > 40 mg.    Stress Yes    Intervention Offer individual and/or small group education and counseling on adjustment to heart disease, stress management and health-related lifestyle change. Teach and support self-help strategies.;Refer participants experiencing significant psychosocial distress to appropriate mental health specialists for further evaluation and treatment. When possible, include family members and significant others in education/counseling sessions.    Expected Outcomes Short Term: Participant demonstrates changes in health-related behavior, relaxation and other stress management skills, ability to obtain effective social support, and compliance with psychotropic medications if prescribed.;Long Term: Emotional wellbeing is indicated by absence of clinically significant psychosocial distress or social isolation.             Core Components/Risk Factors/Patient Goals Review:    Core Components/Risk Factors/Patient Goals at Discharge (Final Review):    ITP Comments:  ITP Comments     Row Name 05/15/22 0903           ITP Comments Medical Director- Dr. TrFransico HimMD. Introduction to Pritikin program at cardiac rehab orientation. Reviewed Initial resource packet with the patient. Patient took packet home to review.                Comments: PaMartese Vanattattended orientation for the cardiac rehabilitation  program on 05/15/22 to perform intake and exercise walk test. Patient introduced to the [facility] orientation packet was reviewed. Completed 6-minute walk test, measurements, initial ITP, and exercise prescription. Vital signs stable. Telemetry-normal sinus rhythm with 1st degree heart block, asymptomatic.  Service time was from 0903 to 1108.

## 2022-05-16 ENCOUNTER — Encounter: Payer: Self-pay | Admitting: *Deleted

## 2022-05-21 ENCOUNTER — Encounter (HOSPITAL_COMMUNITY)
Admission: RE | Admit: 2022-05-21 | Discharge: 2022-05-21 | Disposition: A | Discharge status: Home / Self Care | Payer: Medicare HMO | Source: Ambulatory Visit | Attending: Cardiology | Admitting: Cardiology

## 2022-05-21 DIAGNOSIS — I213 ST elevation (STEMI) myocardial infarction of unspecified site: Secondary | ICD-10-CM | POA: Diagnosis not present

## 2022-05-21 DIAGNOSIS — Z955 Presence of coronary angioplasty implant and graft: Secondary | ICD-10-CM

## 2022-05-21 NOTE — Progress Notes (Signed)
QUALITY OF LIFE SCORE REVIEW  Pt completed Quality of Life survey as a participant in Cardiac Rehab.  Scores 19.0 or below are considered low.  Pt scored well above the low threshold. Overall 25.37, Health and Function 24.47, socioeconomic 27.21, physiological and spiritual 26.57, family 23.8. Patient feels supported by his wife and close friend as he adopts a heart healthy lifestyle. Pt has adult son who is disabled lives in a group home in [place]. Will continue to monitor and intervene as necessary. Cherre Huger, BSN Cardiac and Training and development officer

## 2022-05-21 NOTE — Progress Notes (Signed)
Daily Session Note  Patient Details  Name: Miguel Walker MRN: [id number] Date of Birth: 1941/04/05 Referring Provider:   Flowsheet Row INTENSIVE CARDIAC REHAB ORIENT from 05/15/2022 in [place]  Referring Provider Minus Breeding, MD       Encounter Date: 05/21/2022  Check In:  Session Check In - 05/21/22 0658       Check-In   Supervising physician immediately available to respond to emergencies [facility] immediately available    Physician(s) Dr. Karleen Hampshire    Location [facility]    Staff Present Maurice Small, RN, Quentin Ore, MS, ACSM-CEP, Exercise Physiologist;Olinty Celesta Aver, MS, ACSM CEP, Exercise Physiologist;Jetta Gilford Rile BS, ACSM EP-C, Exercise Physiologist    Virtual Visit No    Medication changes reported     No    Fall or balance concerns reported    No    Tobacco Cessation No Change    Warm-up and Cool-down Performed as group-led instruction    Resistance Training Performed Yes    VAD Patient? No    PAD/SET Patient? No      Pain Assessment   Currently in Pain? No/denies    Pain Score 0-No pain    Multiple Pain Sites No             Capillary Blood Glucose: No results found for this or any previous visit (from the past 24 hour(s)).    Social History   Tobacco Use  Smoking Status Never  Smokeless Tobacco Never    Goals Met:  Exercise tolerated well No report of concerns or symptoms today Strength training completed today  Goals Unmet:  Not Applicable  Comments: Pt started cardiac rehab today.  Pt tolerated light exercise without difficulty. VSS, telemetry-SR with first degree AV Block, asymptomatic.  Medication list reconciled. Pt denies barriers to medication compliance.  PSYCHOSOCIAL ASSESSMENT:  PHQ-0. Pt exhibits positive coping skills, hopeful outlook with supportive family. No psychosocial needs identified at this time, no psychosocial interventions necessary.    Pt enjoys  playing golf and playing in a band with his son.   Pt oriented to exercise equipment and routine.    Understanding verbalized. Maurice Small RN, BSN Cardiac and Pulmonary Rehab Nurse Navigator    Dr. Fransico Him is Medical Director for Cardiac Rehab at [facility].

## 2022-05-23 ENCOUNTER — Encounter (HOSPITAL_COMMUNITY)
Admission: RE | Admit: 2022-05-23 | Discharge: 2022-05-23 | Disposition: A | Discharge status: Home / Self Care | Payer: Medicare HMO | Source: Ambulatory Visit | Attending: Cardiology | Admitting: Cardiology

## 2022-05-23 DIAGNOSIS — I213 ST elevation (STEMI) myocardial infarction of unspecified site: Secondary | ICD-10-CM | POA: Diagnosis not present

## 2022-05-23 DIAGNOSIS — Z955 Presence of coronary angioplasty implant and graft: Secondary | ICD-10-CM

## 2022-05-25 ENCOUNTER — Encounter (HOSPITAL_COMMUNITY)
Admission: RE | Admit: 2022-05-25 | Discharge: 2022-05-25 | Disposition: A | Discharge status: Home / Self Care | Payer: Medicare HMO | Source: Ambulatory Visit | Attending: Cardiology | Admitting: Cardiology

## 2022-05-25 DIAGNOSIS — Z955 Presence of coronary angioplasty implant and graft: Secondary | ICD-10-CM

## 2022-05-25 DIAGNOSIS — I213 ST elevation (STEMI) myocardial infarction of unspecified site: Secondary | ICD-10-CM

## 2022-05-28 ENCOUNTER — Encounter (HOSPITAL_COMMUNITY)
Admission: RE | Admit: 2022-05-28 | Discharge: 2022-05-28 | Disposition: A | Discharge status: Home / Self Care | Payer: Medicare HMO | Source: Ambulatory Visit | Attending: Cardiology | Admitting: Cardiology

## 2022-05-28 DIAGNOSIS — Z955 Presence of coronary angioplasty implant and graft: Secondary | ICD-10-CM

## 2022-05-28 DIAGNOSIS — I213 ST elevation (STEMI) myocardial infarction of unspecified site: Secondary | ICD-10-CM | POA: Diagnosis not present

## 2022-05-30 ENCOUNTER — Encounter (HOSPITAL_COMMUNITY)
Admission: RE | Admit: 2022-05-30 | Discharge: 2022-05-30 | Disposition: A | Discharge status: Home / Self Care | Payer: Medicare HMO | Source: Ambulatory Visit | Attending: Cardiology | Admitting: Cardiology

## 2022-05-30 DIAGNOSIS — I213 ST elevation (STEMI) myocardial infarction of unspecified site: Secondary | ICD-10-CM

## 2022-05-30 DIAGNOSIS — Z955 Presence of coronary angioplasty implant and graft: Secondary | ICD-10-CM

## 2022-05-30 NOTE — Progress Notes (Signed)
Cardiac Individual Treatment Plan  Patient Details  Name: Mj Willis MRN: [id number] Date of Birth: October 22, 1941 Referring Provider:   Flowsheet Row INTENSIVE CARDIAC REHAB ORIENT from 05/15/2022 in [place]  Referring Provider Minus Breeding, MD       Initial Encounter Date:  [facility] [place] [facility] from 05/15/2022 in [place]  Date 05/15/22       Visit Diagnosis: 02/05/22 STEMI  02/05/22 DES x3  Patient's Home Medications on Admission:  Current Outpatient Medications:    ascorbic acid (VITAMIN C) 500 MG tablet, Take 500 mg by mouth in the morning., Disp: , Rfl:    aspirin EC 81 MG tablet, Take 81 mg by mouth in the morning., Disp: , Rfl:    BRILINTA 90 MG TABS tablet, TAKE 1 TABLET BY MOUTH TWICE  DAILY, Disp: 180 tablet, Rfl: 3   FARXIGA 10 MG TABS tablet, TAKE 1 TABLET BY MOUTH DAILY, Disp: 90 tablet, Rfl: 3   hydrocortisone 2.5 % cream, Apply 1 application  topically 2 (two) times daily as needed (irritation.)., Disp: , Rfl:    metroNIDAZOLE (METROCREAM) 0.75 % cream, Apply 1 application  topically 2 (two) times daily as needed (skin rash)., Disp: , Rfl:    Multiple Vitamin (MULTI-VITAMIN) tablet, Take 1 tablet by mouth in the morning. Centrum Silver, Disp: , Rfl:    MYRBETRIQ 25 MG TB24 tablet, Take 25 mg by mouth in the morning., Disp: , Rfl:    nitroGLYCERIN (NITROSTAT) 0.4 MG SL tablet, Place 1 tablet (0.4 mg total) under the tongue every 5 (five) minutes as needed for chest pain., Disp: 25 tablet, Rfl: 3   Omega-3 1000 MG CAPS, Take 1,000 mg by mouth in the morning., Disp: , Rfl:    rosuvastatin (CRESTOR) 40 MG tablet, Take 1 tablet (40 mg total) by mouth daily., Disp: 90 tablet, Rfl: 3   spironolactone (ALDACTONE) 25 MG tablet, Take 1 tablet (25 mg total) by mouth daily., Disp: 30 tablet, Rfl: 3   tolterodine (DETROL LA) 4 MG 24 hr capsule, Take 4 mg by mouth in the morning., Disp:  , Rfl:    triamcinolone (KENALOG) 0.025 % cream, Apply 1 application  topically 2 (two) times daily as needed (skin irritation)., Disp: , Rfl:   Past Medical History: Past Medical History:  Diagnosis Date   Aneurysm, ascending aorta ([place])    Cancer ([place]) 1997   Prostate cancer   Coronary artery disease    Dyslipidemia    Family history of premature CAD    NO ISCHEMIC SYMPTOMS   Hyperlipidemia    Hypertension    Thyroid nodule     Tobacco Use: Social History   Tobacco Use  Smoking Status Never  Smokeless Tobacco Never    Labs: Review Flowsheet  More data may exist      Latest Ref Rng & Units 01/15/2020 08/22/2020 09/06/2021 02/05/2022  Labs for ITP Cardiac and Pulmonary Rehab  Cholestrol 100 - 199 mg/dL 148  148  165  91   LDL (calc) 0 - 99 mg/dL 76  73  90  42   HDL-C >39 mg/dL 46  56  49  38   Trlycerides 0 - 149 mg/dL 152  104  151  55   Hemoglobin A1c 4.8 - 5.6 % - 5.6  5.7  5.4   TCO2 22 - 32 mmol/L - - - 25       05/11/2022  Labs for ITP  Cardiac and Pulmonary Rehab  Cholestrol 133   LDL (calc) 60   HDL-C 50   Trlycerides 134   Hemoglobin A1c -  TCO2 -    Capillary Blood Glucose: Lab Results  Component Value Date   GLUCAP 117 (H) 02/05/2022     Exercise Target Goals: Exercise Program Goal: Individual exercise prescription set using results from initial 6 min walk test and THRR while considering  patient's activity barriers and safety.   Exercise Prescription Goal: Initial exercise prescription builds to 30-45 minutes a day of aerobic activity, 2-3 days per week.  Home exercise guidelines will be given to patient during program as part of exercise prescription that the participant will acknowledge.  Activity Barriers & Risk Stratification:  Activity Barriers & Cardiac Risk Stratification - 05/15/22 1012       Activity Barriers & Cardiac Risk Stratification   Activity Barriers None    Cardiac Risk Stratification High             6 Minute  Walk:  6 Minute Walk     Row Name 05/15/22 0958         6 Minute Walk   Phase Initial     Distance 1813 feet     Walk Time 6 minutes     # of Rest Breaks 0     MPH 3.43     METS 3.63     RPE 11     Perceived Dyspnea  0     VO2 Peak 12.7     Symptoms No     Resting HR 76 bpm     Resting BP 118/72     Resting Oxygen Saturation  97 %     Exercise Oxygen Saturation  during 6 min walk 99 %     Max Ex. HR 110 bpm     Max Ex. BP 148/72     2 Minute Post BP 118/72              Oxygen Initial Assessment:   Oxygen Re-Evaluation:   Oxygen Discharge (Final Oxygen Re-Evaluation):   Initial Exercise Prescription:  Initial Exercise Prescription - 05/15/22 1000       Date of Initial Exercise RX and Referring Provider   Date 05/15/22    Referring Provider Minus Breeding, MD    Expected Discharge Date 07/13/22      Treadmill   MPH 2.5    Grade 0    Minutes 15    METs 2.91      NuStep   Level 3    SPM 85    Minutes 15    METs 2.8      Prescription Details   Frequency (times per week) 3    Duration Progress to 30 minutes of continuous aerobic without signs/symptoms of physical distress      Intensity   THRR 40-80% of Max Heartrate 56-113    Ratings of Perceived Exertion 11-13    Perceived Dyspnea 0-4      Progression   Progression Continue to progress workloads to maintain intensity without signs/symptoms of physical distress.      Resistance Training   Training Prescription Yes    Weight 5 lbs    Reps 10-15             Perform Capillary Blood Glucose checks as needed.  Exercise Prescription Changes:   Exercise Prescription Changes     Row Name 05/21/22 [phone]  Response to Exercise   Blood Pressure (Admit) 102/60       Blood Pressure (Exercise) 120/70       Blood Pressure (Exit) 110/60       Heart Rate (Admit) 83 bpm       Heart Rate (Exercise) 98 bpm       Heart Rate (Exit) 86 bpm       Rating of Perceived Exertion  (Exercise) 9       Perceived Dyspnea (Exercise) 0       Symptoms 0       Comments Pt first day of exercise       Duration Progress to 30 minutes of  aerobic without signs/symptoms of physical distress       Intensity THRR unchanged         Progression   Progression Continue to progress workloads to maintain intensity without signs/symptoms of physical distress.       Average METs 2.66         Resistance Training   Training Prescription Yes       Weight 5 lbs       Reps 10-15       Time 10 Minutes         Treadmill   MPH 2.5       Grade 0       Minutes 15       METs 2.91         NuStep   Level 3       SPM 85       Minutes 15       METs 2.4                Exercise Comments:   Exercise Comments     Row Name 05/21/22 0856           Exercise Comments Pt first day in the CRP2 program. Pt tolerated exercise well with an average MET level of 2.66. Pt is learning his THRR, RPE and ExRx                Exercise Goals and Review:   Exercise Goals     Row Name 05/15/22 0940             Exercise Goals   Increase Physical Activity Yes       Intervention Provide advice, education, support and counseling about physical activity/exercise needs.;Develop an individualized exercise prescription for aerobic and resistive training based on initial evaluation findings, risk stratification, comorbidities and participant's personal goals.       Expected Outcomes Short Term: Attend rehab on a regular basis to increase amount of physical activity.;Long Term: Exercising regularly at least 3-5 days a week.       Increase Strength and Stamina Yes       Intervention Provide advice, education, support and counseling about physical activity/exercise needs.;Develop an individualized exercise prescription for aerobic and resistive training based on initial evaluation findings, risk stratification, comorbidities and participant's personal goals.       Expected Outcomes Short Term:  Increase workloads from initial exercise prescription for resistance, speed, and METs.;Short Term: Perform resistance training exercises routinely during rehab and add in resistance training at home;Long Term: Improve cardiorespiratory fitness, muscular endurance and strength as measured by increased METs and functional capacity (6MWT)       Able to understand and use rate of perceived exertion (RPE) scale Yes       Intervention Provide education and explanation  on how to use RPE scale       Expected Outcomes Short Term: Able to use RPE daily in rehab to express subjective intensity level;Long Term:  Able to use RPE to guide intensity level when exercising independently       Knowledge and understanding of Target Heart Rate Range (THRR) Yes       Intervention Provide education and explanation of THRR including how the numbers were predicted and where they are located for reference       Expected Outcomes Short Term: Able to state/look up THRR;Long Term: Able to use THRR to govern intensity when exercising independently;Short Term: Able to use daily as guideline for intensity in rehab       Able to check pulse independently Yes       Intervention Provide education and demonstration on how to check pulse in carotid and radial arteries.;Review the importance of being able to check your own pulse for safety during independent exercise       Expected Outcomes Short Term: Able to explain why pulse checking is important during independent exercise;Long Term: Able to check pulse independently and accurately       Understanding of Exercise Prescription Yes       Intervention Provide education, explanation, and written materials on patient's individual exercise prescription       Expected Outcomes Short Term: Able to explain program exercise prescription;Long Term: Able to explain home exercise prescription to exercise independently                Exercise Goals Re-Evaluation :  Exercise Goals  Re-Evaluation     Row Name 05/21/22 0854             Exercise Goal Re-Evaluation   Exercise Goals Review Increase Physical Activity;Increase Strength and Stamina;Able to understand and use rate of perceived exertion (RPE) scale;Knowledge and understanding of Target Heart Rate Range (THRR);Understanding of Exercise Prescription       Comments Pt first day in the CRP2 program. Pt tolerated exercise well with an average MET level of 2.66. Pt is learning his THRR, RPE and ExRx       Expected Outcomes Will continue to monitor pt and progress workloads as tolerated without sign or symptom                Discharge Exercise Prescription (Final Exercise Prescription Changes):  Exercise Prescription Changes - 05/21/22 0851       Response to Exercise   Blood Pressure (Admit) 102/60    Blood Pressure (Exercise) 120/70    Blood Pressure (Exit) 110/60    Heart Rate (Admit) 83 bpm    Heart Rate (Exercise) 98 bpm    Heart Rate (Exit) 86 bpm    Rating of Perceived Exertion (Exercise) 9    Perceived Dyspnea (Exercise) 0    Symptoms 0    Comments Pt first day of exercise    Duration Progress to 30 minutes of  aerobic without signs/symptoms of physical distress    Intensity THRR unchanged      Progression   Progression Continue to progress workloads to maintain intensity without signs/symptoms of physical distress.    Average METs 2.66      Resistance Training   Training Prescription Yes    Weight 5 lbs    Reps 10-15    Time 10 Minutes      Treadmill   MPH 2.5    Grade 0    Minutes 15    METs  2.91      NuStep   Level 3    SPM 85    Minutes 15    METs 2.4             Nutrition:  Target Goals: Understanding of nutrition guidelines, daily intake of sodium <1515m, cholesterol <2034m calories 30% from fat and 7% or less from saturated fats, daily to have 5 or more servings of fruits and vegetables.  Biometrics:  Pre Biometrics - 05/15/22 0903       Pre Biometrics    Waist Circumference 36.5 inches    Hip Circumference 42 inches    Waist to Hip Ratio 0.87 %    Triceps Skinfold 16 mm    % Body Fat 26 %    Grip Strength 26 kg    Flexibility 9.5 in    Single Leg Stand 10.5 seconds              Nutrition Therapy Plan and Nutrition Goals:  Nutrition Therapy & Goals - 05/21/22 1104       Nutrition Therapy   Diet Heart Healthy Diet    Drug/Food Interactions Statins/Certain Fruits      Personal Nutrition Goals   Nutrition Goal Patient to choose a variety of fruits, vegetables, whole grains, lean protein/plant protein, nonfat dairy daily as part of a heart healthy diet.    Personal Goal #2 Patient to limit daily sodium intake to <150044m   Personal Goal #3 Patient to identify and limit daily food sources of saturated fat, trans fat, sodium, and refined carbohydrates      Intervention Plan   Intervention Prescribe, educate and counsel regarding individualized specific dietary modifications aiming towards targeted core components such as weight, hypertension, lipid management, diabetes, heart failure and other comorbidities.;Nutrition handout(s) given to patient.    Expected Outcomes Short Term Goal: Understand basic principles of dietary content, such as calories, fat, sodium, cholesterol and nutrients.;Short Term Goal: A plan has been developed with personal nutrition goals set during dietitian appointment.;Long Term Goal: Adherence to prescribed nutrition plan.             Nutrition Assessments:  Nutrition Assessments - 05/21/22 1103       Rate Your Plate Scores   Pre Score 80            MEDIFICTS Score Key: ?70 Need to make dietary changes  40-70 Heart Healthy Diet ? 40 Therapeutic Level Cholesterol Diet   Flowsheet Row INTENSIVE CARDIAC REHAB [facility] Total Score on Admission 80      Picture Your Plate Scores: <40<42healthy dietary pattern with much  room for improvement. 41-50 Dietary pattern unlikely to meet recommendations for good health and room for improvement. 51-60 More healthful dietary pattern, with some room for improvement.  >60 Healthy dietary pattern, although there may be some specific behaviors that could be improved.    Nutrition Goals Re-Evaluation:   Nutrition Goals Re-Evaluation:   Nutrition Goals Discharge (Final Nutrition Goals Re-Evaluation):   Psychosocial: Target Goals: Acknowledge presence or absence of significant depression and/or stress, maximize coping skills, provide positive support system. Participant is able to verbalize types and ability to use techniques and skills needed for reducing stress and depression.  Initial Review & Psychosocial Screening:  Initial Psych Review & Screening - 05/15/22 1606       Initial Review   Current issues with Current Stress Concerns    Source of  Stress Concerns Family    Comments Fraser Din has a disabled son who lives in [place].      Family Dynamics   Good Support System? Yes   Fraser Din has his wife and sons for support            Quality of Life Scores:  Quality of Life - 05/15/22 1529       Quality of Life   Select Quality of Life      Quality of Life Scores   Health/Function Pre 24.47 %    Socioeconomic Pre 27.21 %    Psych/Spiritual Pre 26.57 %    Family Pre 23.8 %    GLOBAL Pre 25.37 %            Scores of 19 and below usually indicate a poorer quality of life in these areas.  A difference of  2-3 points is a clinically meaningful difference.  A difference of 2-3 points in the total score of the Quality of Life Index has been associated with significant improvement in overall quality of life, self-image, physical symptoms, and general health in studies assessing change in quality of life.  PHQ-9: Review Flowsheet       05/15/2022  Depression screen PHQ 2/9  Decreased Interest 0  Down, Depressed, Hopeless 0  PHQ - 2 Score 0    Interpretation of Total Score  Total Score Depression Severity:  1-4 = Minimal depression, 5-9 = Mild depression, 10-14 = Moderate depression, 15-19 = Moderately severe depression, 20-27 = Severe depression   Psychosocial Evaluation and Intervention:  Psychosocial Evaluation - 05/21/22 0741       Psychosocial Evaluation & Interventions   Interventions Encouraged to exercise with the program and follow exercise prescription;Stress management education    Comments Fraser Din denies any psychosocial barriers to participating in Cardiac Rehab.  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 Fraser Din does rate his stress level as medium due to adult aged son who has a disability.    Expected Outcomes Pt will contine to deny any barriers to adopting heart healthy lifestyle. Fraser Din will utilize stress management tools he will learn to cope positive and healthy.    Continue Psychosocial Services  Follow up required by staff             Psychosocial Re-Evaluation:  Psychosocial Re-Evaluation     [place] Name 05/25/22 1200             Psychosocial Re-Evaluation   Current issues with Current Stress Concerns       Comments Fraser Din is off to a wonderful start with positive interaction with staff and fellow participants.  Fraser Din has completed 1 healthy mind set workshop and felt he learned techniques he can use during stressful events with attending to his adult son who has a disability       Expected Outcomes Fraser Din will display positive and healthy stress management skill by incorporating them in every day life       Interventions Stress management education;Encouraged to attend Cardiac Rehabilitation for the exercise       Continue Psychosocial Services  No Follow up required                Psychosocial Discharge (Final Psychosocial Re-Evaluation):  Psychosocial Re-Evaluation - 05/25/22 1200       Psychosocial Re-Evaluation   Current issues with Current Stress Concerns    Comments Fraser Din is  off to a wonderful start with positive interaction with staff and  fellow participants.  Fraser Din has completed 1 healthy mind set workshop and felt he learned techniques he can use during stressful events with attending to his adult son who has a disability    Expected Outcomes Fraser Din will display positive and healthy stress management skill by incorporating them in every day life    Interventions Stress management education;Encouraged to attend Cardiac Rehabilitation for the exercise    Continue Psychosocial Services  No Follow up required             Vocational Rehabilitation: Provide vocational rehab assistance to qualifying candidates.   Vocational Rehab Evaluation & Intervention:  Vocational Rehab - 05/15/22 1611       Initial Vocational Rehab Evaluation & Intervention   Assessment shows need for Vocational Rehabilitation No   Fraser Din is retired and does not need vocational rehab at this time            Education: Education Goals: Education classes will be provided on a weekly basis, covering required topics. Participant will state understanding/return demonstration of topics presented.    Education - 05/30/22 0900       Education   Cardiac Education Topics Pritikin    Sales executive    Weekly Topic Satisfying Salads and Dressings    Instruction Review Code 1- Verbalizes Understanding    Class Start Time 0813    Class Stop Time 0900    Class Time Calculation (min) 47 min             Core Videos: Exercise    Move It!  Clinical staff conducted group or individual video education with verbal and written material and guidebook.  Patient learns the recommended Pritikin exercise program. Exercise with the goal of living a long, healthy life. Some of the health benefits of exercise include controlled diabetes, healthier blood pressure levels, improved cholesterol levels, improved heart and lung capacity, improved sleep, and  better body composition. Everyone should speak with their doctor before starting or changing an exercise routine.  Biomechanical Limitations Clinical staff conducted group or individual video education with verbal and written material and guidebook.  Patient learns how biomechanical limitations can impact exercise and how we can mitigate and possibly overcome limitations to have an impactful and balanced exercise routine.  Body Composition Clinical staff conducted group or individual video education with verbal and written material and guidebook.  Patient learns that body composition (ratio of muscle mass to fat mass) is a key component to assessing overall fitness, rather than body weight alone. Increased fat mass, especially visceral belly fat, can put [place] at increased risk for metabolic syndrome, type 2 diabetes, heart disease, and even death. It is recommended to combine diet and exercise (cardiovascular and resistance training) to improve your body composition. Seek guidance from your physician and exercise physiologist before implementing an exercise routine.  Exercise Action Plan Clinical staff conducted group or individual video education with verbal and written material and guidebook.  Patient learns the recommended strategies to achieve and enjoy long-term exercise adherence, including variety, self-motivation, self-efficacy, and positive decision making. Benefits of exercise include fitness, good health, weight management, more energy, better sleep, less stress, and overall well-being.  Medical   Heart Disease Risk Reduction Clinical staff conducted group or individual video education with verbal and written material and guidebook.  Patient learns our heart is our most vital organ as it circulates oxygen, nutrients, white blood cells, and hormones throughout the  entire body, and carries waste away. Data supports a plant-based eating plan like the Pritikin Program for its effectiveness in  slowing progression of and reversing heart disease. The video provides a number of recommendations to address heart disease.   Metabolic Syndrome and Belly Fat  Clinical staff conducted group or individual video education with verbal and written material and guidebook.  Patient learns what metabolic syndrome is, how it leads to heart disease, and how one can reverse it and keep it from coming back. You have metabolic syndrome if you have 3 of the following 5 criteria: abdominal obesity, high blood pressure, high triglycerides, low HDL cholesterol, and high blood sugar.  Hypertension and Heart Disease Clinical staff conducted group or individual video education with verbal and written material and guidebook.  Patient learns that high blood pressure, or hypertension, is very common in the [place]. Hypertension is largely due to excessive salt intake, but other important risk factors include being overweight, physical inactivity, drinking too much alcohol, smoking, and not eating enough potassium from fruits and vegetables. High blood pressure is a leading risk factor for heart attack, stroke, congestive heart failure, dementia, kidney failure, and premature death. Long-term effects of excessive salt intake include stiffening of the arteries and thickening of heart muscle and organ damage. Recommendations include ways to reduce hypertension and the risk of heart disease.  Diseases of Our Time - Focusing on Diabetes Clinical staff conducted group or individual video education with verbal and written material and guidebook.  Patient learns why the best way to stop diseases of our time is prevention, through food and other lifestyle changes. Medicine (such as prescription pills and surgeries) is often only a Band-Aid on the problem, not a long-term solution. Most common diseases of our time include obesity, type 2 diabetes, hypertension, heart disease, and cancer. The Pritikin Program is recommended and  has been proven to help reduce, reverse, and/or prevent the damaging effects of metabolic syndrome.  Nutrition   Overview of the Pritikin Eating Plan  Clinical staff conducted group or individual video education with verbal and written material and guidebook.  Patient learns about the [place] for disease risk reduction. The [place] emphasizes a wide variety of unrefined, minimally-processed carbohydrates, like fruits, vegetables, whole grains, and legumes. Go, Caution, and Stop food choices are explained. Plant-based and lean animal proteins are emphasized. Rationale provided for low sodium intake for blood pressure control, low added sugars for blood sugar stabilization, and low added fats and oils for coronary artery disease risk reduction and weight management.  Calorie Density  Clinical staff conducted group or individual video education with verbal and written material and guidebook.  Patient learns about calorie density and how it impacts the Pritikin Eating Plan. Knowing the characteristics of the food you choose will help you decide whether those foods will lead to weight gain or weight loss, and whether you want to consume more or less of them. Weight loss is usually a side effect of the Pritikin Eating Plan because of its focus on low calorie-dense foods.  Label Reading  Clinical staff conducted group or individual video education with verbal and written material and guidebook.  Patient learns about the Pritikin recommended label reading guidelines and corresponding recommendations regarding calorie density, added sugars, sodium content, and whole grains.  Dining Out - Part 1  Clinical staff conducted group or individual video education with verbal and written material and guidebook.  Patient learns that restaurant meals can be sabotaging  because they can be so high in calories, fat, sodium, and/or sugar. Patient learns recommended strategies on how to positively  address this and avoid unhealthy pitfalls.  Facts on Fats  Clinical staff conducted group or individual video education with verbal and written material and guidebook.  Patient learns that lifestyle modifications can be just as effective, if not more so, as many medications for lowering your risk of heart disease. A Pritikin lifestyle can help to reduce your risk of inflammation and atherosclerosis (cholesterol build-up, or plaque, in the artery walls). Lifestyle interventions such as dietary choices and physical activity address the cause of atherosclerosis. A review of the types of fats and their impact on blood cholesterol levels, along with dietary recommendations to reduce fat intake is also included.  Nutrition Action Plan  Clinical staff conducted group or individual video education with verbal and written material and guidebook.  Patient learns how to incorporate Pritikin recommendations into their lifestyle. Recommendations include planning and keeping personal health goals in mind as an important part of their success.  Healthy Mind-Set    Healthy Minds, Bodies, Hearts  Clinical staff conducted group or individual video education with verbal and written material and guidebook.  Patient learns how to identify when they are stressed. Video will discuss the impact of that stress, as well as the many benefits of stress management. Patient will also be introduced to stress management techniques. The way we think, act, and feel has an impact on our hearts.  How Our Thoughts Can Heal Our Hearts  Clinical staff conducted group or individual video education with verbal and written material and guidebook.  Patient learns that negative thoughts can cause depression and anxiety. This can result in negative lifestyle behavior and serious health problems. Cognitive behavioral therapy is an effective method to help control our thoughts in order to change and improve our emotional outlook.  Additional  Videos:  Exercise    Improving Performance  Clinical staff conducted group or individual video education with verbal and written material and guidebook.  Patient learns to use a non-linear approach by alternating intensity levels and lengths of time spent exercising to help burn more calories and lose more body fat. Cardiovascular exercise helps improve heart health, metabolism, hormonal balance, blood sugar control, and recovery from fatigue. Resistance training improves strength, endurance, balance, coordination, reaction time, metabolism, and muscle mass. Flexibility exercise improves circulation, posture, and balance. Seek guidance from your physician and exercise physiologist before implementing an exercise routine and learn your capabilities and proper form for all exercise.  Introduction to Yoga  Clinical staff conducted group or individual video education with verbal and written material and guidebook.  Patient learns about yoga, a discipline of the coming together of mind, breath, and body. The benefits of yoga include improved flexibility, improved range of motion, better posture and core strength, increased lung function, weight loss, and positive self-image. Yoga's heart health benefits include lowered blood pressure, healthier heart rate, decreased cholesterol and triglyceride levels, improved immune function, and reduced stress. Seek guidance from your physician and exercise physiologist before implementing an exercise routine and learn your capabilities and proper form for all exercise.  Medical   Aging: Enhancing Your Quality of Life  Clinical staff conducted group or individual video education with verbal and written material and guidebook.  Patient learns key strategies and recommendations to stay in good physical health and enhance quality of life, such as prevention strategies, having an advocate, securing a Health Care Proxy and Power of  Attorney, and keeping a list of medications  and system for tracking them. It also discusses how to avoid risk for bone loss.  Biology of Weight Control  Clinical staff conducted group or individual video education with verbal and written material and guidebook.  Patient learns that weight gain occurs because we consume more calories than we burn (eating more, moving less). Even if your body weight is normal, you may have higher ratios of fat compared to muscle mass. Too much body fat puts you at increased risk for cardiovascular disease, heart attack, stroke, type 2 diabetes, and obesity-related cancers. In addition to exercise, following the [place] can help reduce your risk.  Decoding Lab Results  Clinical staff conducted group or individual video education with verbal and written material and guidebook.  Patient learns that lab test reflects one measurement whose values change over time and are influenced by many factors, including medication, stress, sleep, exercise, food, hydration, pre-existing medical conditions, and more. It is recommended to use the knowledge from this video to become more involved with your lab results and evaluate your numbers to speak with your doctor.   Diseases of Our Time - Overview  Clinical staff conducted group or individual video education with verbal and written material and guidebook.  Patient learns that according to the CDC, 50% to 70% of chronic diseases (such as obesity, type 2 diabetes, elevated lipids, hypertension, and heart disease) are avoidable through lifestyle improvements including healthier food choices, listening to satiety cues, and increased physical activity.  Sleep Disorders Clinical staff conducted group or individual video education with verbal and written material and guidebook.  Patient learns how good quality and duration of sleep are important to overall health and well-being. Patient also learns about sleep disorders and how they impact health along with  recommendations to address them, including discussing with a physician.  Nutrition  Dining Out - Part 2 Clinical staff conducted group or individual video education with verbal and written material and guidebook.  Patient learns how to plan ahead and communicate in order to maximize their dining experience in a healthy and nutritious manner. Included are recommended food choices based on the type of restaurant the patient is visiting.   Fueling a Best boy conducted group or individual video education with verbal and written material and guidebook.  There is a strong connection between our food choices and our health. Diseases like obesity and type 2 diabetes are very prevalent and are in large-part due to lifestyle choices. The Pritikin Eating Plan provides plenty of food and hunger-curbing satisfaction. It is easy to follow, affordable, and helps reduce health risks.  Menu Workshop  Clinical staff conducted group or individual video education with verbal and written material and guidebook.  Patient learns that restaurant meals can sabotage health goals because they are often packed with calories, fat, sodium, and sugar. Recommendations include strategies to plan ahead and to communicate with the manager, chef, or server to help order a healthier meal.  Planning Your Eating Strategy  Clinical staff conducted group or individual video education with verbal and written material and guidebook.  Patient learns about the [place] and its benefit of reducing the risk of disease. The [place] does not focus on calories. Instead, it emphasizes high-quality, nutrient-rich foods. By knowing the characteristics of the foods, we choose, we can determine their calorie density and make informed decisions.  Targeting Your Nutrition Priorities  Clinical staff conducted group or individual  video education with verbal and written material and guidebook.  Patient learns  that lifestyle habits have a tremendous impact on disease risk and progression. This video provides eating and physical activity recommendations based on your personal health goals, such as reducing LDL cholesterol, losing weight, preventing or controlling type 2 diabetes, and reducing high blood pressure.  Vitamins and Minerals  Clinical staff conducted group or individual video education with verbal and written material and guidebook.  Patient learns different ways to obtain key vitamins and minerals, including through a recommended healthy diet. It is important to discuss all supplements you take with your doctor.   Healthy Mind-Set    Smoking Cessation  Clinical staff conducted group or individual video education with verbal and written material and guidebook.  Patient learns that cigarette smoking and tobacco addiction pose a serious health risk which affects millions of people. Stopping smoking will significantly reduce the risk of heart disease, lung disease, and many forms of cancer. Recommended strategies for quitting are covered, including working with your doctor to develop a successful plan.  Culinary   Becoming a Financial trader conducted group or individual video education with verbal and written material and guidebook.  Patient learns that cooking at home can be healthy, cost-effective, quick, and puts them in control. Keys to cooking healthy recipes will include looking at your recipe, assessing your equipment needs, planning ahead, making it simple, choosing cost-effective seasonal ingredients, and limiting the use of added fats, salts, and sugars.  Cooking - Breakfast and Snacks  Clinical staff conducted group or individual video education with verbal and written material and guidebook.  Patient learns how important breakfast is to satiety and nutrition through the entire day. Recommendations include key foods to eat during breakfast to help stabilize blood sugar  levels and to prevent overeating at meals later in the day. Planning ahead is also a key component.  Cooking - Human resources officer conducted group or individual video education with verbal and written material and guidebook.  Patient learns eating strategies to improve overall health, including an approach to cook more at home. Recommendations include thinking of animal protein as a side on your plate rather than center stage and focusing instead on lower calorie dense options like vegetables, fruits, whole grains, and plant-based proteins, such as beans. Making sauces in large quantities to freeze for later and leaving the skin on your vegetables are also recommended to maximize your experience.  Cooking - Healthy Salads and Dressing Clinical staff conducted group or individual video education with verbal and written material and guidebook.  Patient learns that vegetables, fruits, whole grains, and legumes are the foundations of the [place]. Recommendations include how to incorporate each of these in flavorful and healthy salads, and how to create homemade salad dressings. Proper handling of ingredients is also covered. Cooking - Soups and [employer] - Soups and Desserts Clinical staff conducted group or individual video education with verbal and written material and guidebook.  Patient learns that Pritikin soups and desserts make for easy, nutritious, and delicious snacks and meal components that are low in sodium, fat, sugar, and calorie density, while high in vitamins, minerals, and filling fiber. Recommendations include simple and healthy ideas for soups and desserts.   Overview     The Pritikin Solution Program Overview Clinical staff conducted group or individual video education with verbal and written material and guidebook.  Patient learns that the results of the Pritikin Program  have been documented in more than 100 articles published in peer-reviewed  journals, and the benefits include reducing risk factors for (and, in some cases, even reversing) high cholesterol, high blood pressure, type 2 diabetes, obesity, and more! An overview of the three key pillars of the Pritikin Program will be covered: eating well, doing regular exercise, and having a healthy mind-set.  WORKSHOPS  Exercise: Exercise Basics: Building Your Action Plan Clinical staff led group instruction and group discussion with PowerPoint presentation and patient guidebook. To enhance the learning environment the use of posters, models and videos may be added. At the conclusion of this workshop, patients will comprehend the difference between physical activity and exercise, as well as the benefits of incorporating both, into their routine. Patients will understand the FITT (Frequency, Intensity, Time, and Type) principle and how to use it to build an exercise action plan. In addition, safety concerns and other considerations for exercise and cardiac rehab will be addressed by the presenter. The purpose of this lesson is to promote a comprehensive and effective weekly exercise routine in order to improve patients' overall level of fitness.   Managing Heart Disease: Your Path to a Healthier Heart Clinical staff led group instruction and group discussion with PowerPoint presentation and patient guidebook. To enhance the learning environment the use of posters, models and videos may be added.At the conclusion of this workshop, patients will understand the anatomy and physiology of the heart. Additionally, they will understand how Pritikin's three pillars impact the risk factors, the progression, and the management of heart disease.  The purpose of this lesson is to provide a high-level overview of the heart, heart disease, and how the Pritikin lifestyle positively impacts risk factors.  Exercise Biomechanics Clinical staff led group instruction and group discussion with PowerPoint  presentation and patient guidebook. To enhance the learning environment the use of posters, models and videos may be added. Patients will learn how the structural parts of their bodies function and how these functions impact their daily activities, movement, and exercise. Patients will learn how to promote a neutral spine, learn how to manage pain, and identify ways to improve their physical movement in order to promote healthy living. The purpose of this lesson is to expose patients to common physical limitations that impact physical activity. Participants will learn practical ways to adapt and manage aches and pains, and to minimize their effect on regular exercise. Patients will learn how to maintain good posture while sitting, walking, and lifting.  Balance Training and Fall Prevention  Clinical staff led group instruction and group discussion with PowerPoint presentation and patient guidebook. To enhance the learning environment the use of posters, models and videos may be added. At the conclusion of this workshop, patients will understand the importance of their sensorimotor skills (vision, proprioception, and the vestibular system) in maintaining their ability to balance as they age. Patients will apply a variety of balancing exercises that are appropriate for their current level of function. Patients will understand the common causes for poor balance, possible solutions to these problems, and ways to modify their physical environment in order to minimize their fall risk. The purpose of this lesson is to teach patients about the importance of maintaining balance as they age and ways to minimize their risk of falling.  WORKSHOPS   Nutrition:  Fueling a Scientist, research (physical sciences) led group instruction and group discussion with PowerPoint presentation and patient guidebook. To enhance the learning environment the use of posters, models and videos may  be added. Patients will review the  foundational principles of the [place] and understand what constitutes a serving size in each of the food groups. Patients will also learn Pritikin-friendly foods that are better choices when away from home and review make-ahead meal and snack options. Calorie density will be reviewed and applied to three nutrition priorities: weight maintenance, weight loss, and weight gain. The purpose of this lesson is to reinforce (in a group setting) the key concepts around what patients are recommended to eat and how to apply these guidelines when away from home by planning and selecting Pritikin-friendly options. Patients will understand how calorie density may be adjusted for different weight management goals.  Mindful Eating  Clinical staff led group instruction and group discussion with PowerPoint presentation and patient guidebook. To enhance the learning environment the use of posters, models and videos may be added. Patients will briefly review the concepts of the [place] and the importance of low-calorie dense foods. The concept of mindful eating will be introduced as well as the importance of paying attention to internal hunger signals. Triggers for non-hunger eating and techniques for dealing with triggers will be explored. The purpose of this lesson is to provide patients with the opportunity to review the basic principles of the [place], discuss the value of eating mindfully and how to measure internal cues of hunger and fullness using the Hunger Scale. Patients will also discuss reasons for non-hunger eating and learn strategies to use for controlling emotional eating.  Targeting Your Nutrition Priorities Clinical staff led group instruction and group discussion with PowerPoint presentation and patient guidebook. To enhance the learning environment the use of posters, models and videos may be added. Patients will learn how to determine their genetic susceptibility to  disease by reviewing their family history. Patients will gain insight into the importance of diet as part of an overall healthy lifestyle in mitigating the impact of genetics and other environmental insults. The purpose of this lesson is to provide patients with the opportunity to assess their personal nutrition priorities by looking at their family history, their own health history and current risk factors. Patients will also be able to discuss ways of prioritizing and modifying the [place] for their highest risk areas  Menu  Clinical staff led group instruction and group discussion with PowerPoint presentation and patient guidebook. To enhance the learning environment the use of posters, models and videos may be added. Using menus brought in from [employer], or printed from [employer], patients will apply the [employer] dining out guidelines that were presented in the [employer] video. Patients will also be able to practice these guidelines in a variety of provided scenarios. The purpose of this lesson is to provide patients with the opportunity to practice hands-on learning of the [place] with actual menus and practice scenarios.  Label Reading Clinical staff led group instruction and group discussion with PowerPoint presentation and patient guidebook. To enhance the learning environment the use of posters, models and videos may be added. Patients will review and discuss the Pritikin label reading guidelines presented in Pritikin's Label Reading Educational series video. Using fool labels brought in from local grocery stores and markets, patients will apply the label reading guidelines and determine if the packaged food meet the Pritikin guidelines. The purpose of this lesson is to provide patients with the opportunity to review, discuss, and practice hands-on learning of the Pritikin Label Reading guidelines with  actual packaged food  labels. [place] Workshops are designed to teach patients ways to prepare quick, simple, and affordable recipes at home. The importance of nutrition's role in chronic disease risk reduction is reflected in its emphasis in the overall Pritikin program. By learning how to prepare essential core Pritikin Eating Plan recipes, patients will increase control over what they eat; be able to customize the flavor of foods without the use of added salt, sugar, or fat; and improve the quality of the food they consume. By learning a set of core recipes which are easily assembled, quickly prepared, and affordable, patients are more likely to prepare more healthy foods at home. These workshops focus on convenient breakfasts, simple entres, side dishes, and desserts which can be prepared with minimal effort and are consistent with nutrition recommendations for cardiovascular risk reduction. Cooking [employer] are taught by a Engineer, materials (RD) who has been trained by the [employer]. The chef or RD has a clear understanding of the importance of minimizing - if not completely eliminating - added fat, sugar, and sodium in recipes. Throughout the series of [place] Workshop sessions, patients will learn about healthy ingredients and efficient methods of cooking to build confidence in their capability to prepare    Cooking School weekly topics:  Adding Flavor- Sodium-Free  Fast and Healthy Breakfasts  Powerhouse Plant-Based Proteins  Satisfying Salads and Dressings  Simple Sides and Sauces  International Cuisine-Spotlight on the [place] Zones  Delicious Desserts  Savory Soups  Efficiency Cooking - Meals in a Snap  Tasty Appetizers and Snacks  Comforting Weekend Breakfasts  One-Pot Wonders   Fast Evening Meals  [place] (Psychosocial): New Thoughts, New  Behaviors Clinical staff led group instruction and group discussion with PowerPoint presentation and patient guidebook. To enhance the learning environment the use of posters, models and videos may be added. Patients will learn and practice techniques for developing effective health and lifestyle goals. Patients will be able to effectively apply the goal setting process learned to develop at least one new personal goal.  The purpose of this lesson is to expose patients to a new skill set of behavior modification techniques such as techniques setting SMART goals, overcoming barriers, and achieving new thoughts and new behaviors.  Managing Moods and Relationships Clinical staff led group instruction and group discussion with PowerPoint presentation and patient guidebook. To enhance the learning environment the use of posters, models and videos may be added. Patients will learn how emotional and chronic stress factors can impact their health and relationships. They will learn healthy ways to manage their moods and utilize positive coping mechanisms. In addition, ICR patients will learn ways to improve communication skills. The purpose of this lesson is to expose patients to ways of understanding how one's mood and health are intimately connected. Developing a healthy outlook can help build positive relationships and connections with others. Patients will understand the importance of utilizing effective communication skills that include actively listening and being heard. They will learn and understand the importance of the "4 Cs" and especially Connections in fostering of a Healthy Mind-Set.  Healthy Sleep for a Healthy Heart Clinical staff led group instruction and group discussion with PowerPoint presentation and patient guidebook. To enhance the learning environment the use of posters, models and videos may be added. At the conclusion of this workshop, patients will be able to demonstrate knowledge  of the  importance of sleep to overall health, well-being, and quality of life. They will understand the symptoms of, and treatments for, common sleep disorders. Patients will also be able to identify daytime and nighttime behaviors which impact sleep, and they will be able to apply these tools to help manage sleep-related challenges. The purpose of this lesson is to provide patients with a general overview of sleep and outline the importance of quality sleep. Patients will learn about a few of the most common sleep disorders. Patients will also be introduced to the concept of "sleep hygiene," and discover ways to self-manage certain sleeping problems through simple daily behavior changes. Finally, the workshop will motivate patients by clarifying the links between quality sleep and their goals of heart-healthy living.   Recognizing and Reducing Stress Clinical staff led group instruction and group discussion with PowerPoint presentation and patient guidebook. To enhance the learning environment the use of posters, models and videos may be added. At the conclusion of this workshop, patients will be able to understand the types of stress reactions, differentiate between acute and chronic stress, and recognize the impact that chronic stress has on their health. They will also be able to apply different coping mechanisms, such as reframing negative self-talk. Patients will have the opportunity to practice a variety of stress management techniques, such as deep abdominal breathing, progressive muscle relaxation, and/or guided imagery.  The purpose of this lesson is to educate patients on the role of stress in their lives and to provide healthy techniques for coping with it.  Learning Barriers/Preferences:  Learning Barriers/Preferences - 05/15/22 1609       Learning Barriers/Preferences   Learning Barriers Sight;Hearing   Wears glasses has bilateral hearing aides   Learning Preferences Skilled  Demonstration;Pictoral;Video             Education Topics:  Knowledge Questionnaire Score:  Knowledge Questionnaire Score - 05/15/22 1537       Knowledge Questionnaire Score   Pre Score 23/24             Core Components/Risk Factors/Patient Goals at Admission:  Personal Goals and Risk Factors at Admission - 05/15/22 0938       Core Components/Risk Factors/Patient Goals on Admission   Hypertension Yes    Intervention Provide education on lifestyle modifcations including regular physical activity/exercise, weight management, moderate sodium restriction and increased consumption of fresh fruit, vegetables, and low fat dairy, alcohol moderation, and smoking cessation.;Monitor prescription use compliance.    Expected Outcomes Short Term: Continued assessment and intervention until BP is < 140/49m HG in hypertensive participants. < 130/876mHG in hypertensive participants with diabetes, heart failure or chronic kidney disease.;Long Term: Maintenance of blood pressure at goal levels.    Lipids Yes    Intervention Provide education and support for participant on nutrition & aerobic/resistive exercise along with prescribed medications to achieve LDL <7023mHDL >57m13m  Expected Outcomes Short Term: Participant states understanding of desired cholesterol values and is compliant with medications prescribed. Participant is following exercise prescription and nutrition guidelines.;Long Term: Cholesterol controlled with medications as prescribed, with individualized exercise RX and with personalized nutrition plan. Value goals: LDL < 70mg17mL > 40 mg.    Stress Yes    Intervention Offer individual and/or small group education and counseling on adjustment to heart disease, stress management and health-related lifestyle change. Teach and support self-help strategies.;Refer participants experiencing significant psychosocial distress to appropriate mental health specialists for further evaluation  and treatment. When possible,  include family members and significant others in education/counseling sessions.    Expected Outcomes Short Term: Participant demonstrates changes in health-related behavior, relaxation and other stress management skills, ability to obtain effective social support, and compliance with psychotropic medications if prescribed.;Long Term: Emotional wellbeing is indicated by absence of clinically significant psychosocial distress or social isolation.             Core Components/Risk Factors/Patient Goals Review:   Goals and Risk Factor Review     Row Name 05/25/22 1205             Core Components/Risk Factors/Patient Goals Review   Personal Goals Review Hypertension;Lipids;Stress       Review Fraser Din is off to great start in Intensive Cardiac Rehab with the completion of 3 exercise and 3 education sessions.  Pt reports compiance with heart related medicatios and his vital signsremain within norma limits pre/post exercise.  Fraser Din has met informally with the RD, Sam and he has attended nutritional education sessions regarding lipid management and compliance to statin therapy       Expected Outcomes Fraser Din will adopt a heart healthy lifestyle in accordance to the three pillars learned for [facility] cardiac re[facility]: exercise, heart healthy nutrition and healthy mindset.                Core Components/Risk Factors/Patient Goals at Discharge (Final Review):   Goals and Risk Factor Review - 05/25/22 1205       Core Components/Risk Factors/Patient Goals Review   Personal Goals Review Hypertension;Lipids;Stress    Review Fraser Din is off to great start in Intensive Cardiac Rehab with the completion of 3 exercise and 3 education sessions.  Pt reports compiance with heart related medicatios and his vital signsremain within norma limits pre/post exercise.  Fraser Din has met informally with the RD, Sam and he has attended nutritional education sessions regarding lipid management and  compliance to statin therapy    Expected Outcomes Fraser Din will adopt a heart healthy lifestyle in accordance to the three pillars learned for [facility] Int[facility] cardiac re[facility]: exercise, heart healthy nutrition and healthy mindset.             ITP Comments:  ITP Comments     Row Name 05/15/22 0903 05/25/22 1159         ITP Comments Medical Director- Dr. Fransico Him, MD. Introduction to [facility] program at cardiac rehab orientation. Reviewed Initial resource packet with the patient. Patient took packet home to review. Fraser Din has completed 3 exercise and 3 education sessions during this 30 day ITP Review               Comments: Pt attendance is good and he is progressing well in the program.

## 2022-05-31 ENCOUNTER — Telehealth: Payer: Self-pay | Admitting: Cardiology

## 2022-05-31 ENCOUNTER — Other Ambulatory Visit: Payer: Self-pay

## 2022-05-31 ENCOUNTER — Encounter (HOSPITAL_COMMUNITY): Payer: Self-pay | Admitting: Emergency Medicine

## 2022-05-31 ENCOUNTER — Observation Stay (HOSPITAL_COMMUNITY)
Admission: EM | Admit: 2022-05-31 | Discharge: 2022-06-01 | Disposition: A | Discharge status: Home / Self Care | Payer: Medicare HMO | Attending: Internal Medicine | Admitting: Internal Medicine

## 2022-05-31 ENCOUNTER — Emergency Department (HOSPITAL_COMMUNITY): Payer: Medicare HMO

## 2022-05-31 DIAGNOSIS — Z79899 Other long term (current) drug therapy: Secondary | ICD-10-CM | POA: Diagnosis not present

## 2022-05-31 DIAGNOSIS — Z7984 Long term (current) use of oral hypoglycemic drugs: Secondary | ICD-10-CM | POA: Insufficient documentation

## 2022-05-31 DIAGNOSIS — Z960 Presence of urogenital implants: Secondary | ICD-10-CM | POA: Insufficient documentation

## 2022-05-31 DIAGNOSIS — I300 Acute nonspecific idiopathic pericarditis: Secondary | ICD-10-CM | POA: Diagnosis not present

## 2022-05-31 DIAGNOSIS — I110 Hypertensive heart disease with heart failure: Secondary | ICD-10-CM | POA: Diagnosis not present

## 2022-05-31 DIAGNOSIS — I252 Old myocardial infarction: Secondary | ICD-10-CM | POA: Insufficient documentation

## 2022-05-31 DIAGNOSIS — J869 Pyothorax without fistula: Secondary | ICD-10-CM

## 2022-05-31 DIAGNOSIS — I5042 Chronic combined systolic (congestive) and diastolic (congestive) heart failure: Secondary | ICD-10-CM

## 2022-05-31 DIAGNOSIS — I712 Thoracic aortic aneurysm, without rupture, unspecified: Secondary | ICD-10-CM | POA: Insufficient documentation

## 2022-05-31 DIAGNOSIS — Z7982 Long term (current) use of aspirin: Secondary | ICD-10-CM | POA: Insufficient documentation

## 2022-05-31 DIAGNOSIS — R0789 Other chest pain: Principal | ICD-10-CM

## 2022-05-31 DIAGNOSIS — I255 Ischemic cardiomyopathy: Secondary | ICD-10-CM | POA: Insufficient documentation

## 2022-05-31 DIAGNOSIS — J810 Acute pulmonary edema: Secondary | ICD-10-CM

## 2022-05-31 DIAGNOSIS — I2510 Atherosclerotic heart disease of native coronary artery without angina pectoris: Secondary | ICD-10-CM | POA: Insufficient documentation

## 2022-05-31 DIAGNOSIS — E785 Hyperlipidemia, unspecified: Secondary | ICD-10-CM

## 2022-05-31 DIAGNOSIS — Z8679 Personal history of other diseases of the circulatory system: Secondary | ICD-10-CM

## 2022-05-31 DIAGNOSIS — I3139 Other pericardial effusion (noninflammatory): Secondary | ICD-10-CM

## 2022-05-31 DIAGNOSIS — Z8546 Personal history of malignant neoplasm of prostate: Secondary | ICD-10-CM | POA: Diagnosis not present

## 2022-05-31 DIAGNOSIS — Z955 Presence of coronary angioplasty implant and graft: Secondary | ICD-10-CM | POA: Diagnosis not present

## 2022-05-31 DIAGNOSIS — I700 Atherosclerosis of aorta: Secondary | ICD-10-CM | POA: Diagnosis not present

## 2022-05-31 DIAGNOSIS — J90 Pleural effusion, not elsewhere classified: Secondary | ICD-10-CM

## 2022-05-31 LAB — CBC
HCT: 42.7 % (ref 39.0–52.0)
Hemoglobin: 14.1 g/dL (ref 13.0–17.0)
MCH: 27.9 pg (ref 26.0–34.0)
MCHC: 33 g/dL (ref 30.0–36.0)
MCV: 84.6 fL (ref 80.0–100.0)
Platelets: 257 10*3/uL (ref 150–400)
RBC: 5.05 MIL/uL (ref 4.22–5.81)
RDW: 14.8 % (ref 11.5–15.5)
WBC: 6.7 10*3/uL (ref 4.0–10.5)
nRBC: 0 % (ref 0.0–0.2)

## 2022-05-31 LAB — BASIC METABOLIC PANEL
Anion gap: 11 (ref 5–15)
BUN: 13 mg/dL (ref 8–23)
CO2: 27 mmol/L (ref 22–32)
Calcium: 9.7 mg/dL (ref 8.9–10.3)
Chloride: 99 mmol/L (ref 98–111)
Creatinine, Ser: 1.06 mg/dL (ref 0.61–1.24)
GFR, Estimated: >60 mL/min (ref >60)
Glucose, Bld: 102 mg/dL — ABNORMAL HIGH (ref 70–99)
Potassium: 4.7 mmol/L (ref 3.5–5.1)
Sodium: 137 mmol/L (ref 135–145)

## 2022-05-31 LAB — C-REACTIVE PROTEIN: CRP: 8.9 mg/dL — ABNORMAL HIGH (ref <1.0)

## 2022-05-31 LAB — PROCALCITONIN: Procalcitonin: <0.1 ng/mL

## 2022-05-31 LAB — TROPONIN I (HIGH SENSITIVITY)
Troponin I (High Sensitivity): 28 ng/L — ABNORMAL HIGH (ref <18)
Troponin I (High Sensitivity): 26 ng/L — ABNORMAL HIGH (ref <18)

## 2022-05-31 LAB — MAGNESIUM: Magnesium: 2.4 mg/dL (ref 1.7–2.4)

## 2022-05-31 LAB — D-DIMER, QUANTITATIVE: D-Dimer, Quant: 2.58 ug/mL-FEU — ABNORMAL HIGH (ref 0.00–0.50)

## 2022-05-31 LAB — SEDIMENTATION RATE: Sed Rate: 48 mm/hr — ABNORMAL HIGH (ref 0–16)

## 2022-05-31 MED ORDER — FENTANYL CITRATE PF 50 MCG/ML IJ SOSY: 50.0000 ug | PREFILLED_SYRINGE | Freq: Once | INTRAMUSCULAR | Status: DC

## 2022-05-31 MED ORDER — TICAGRELOR 90 MG PO TABS
90.0000 mg | ORAL_TABLET | Freq: Two times a day (BID) | ORAL | Status: DC
Start: 2022-05-31 — End: 2022-05-31

## 2022-05-31 MED ORDER — ASPIRIN 81 MG PO CHEW
243.0000 mg | CHEWABLE_TABLET | Freq: Once | ORAL | Status: AC
  Administered 2022-05-31: 243 mg via ORAL
  Filled 2022-05-31: qty 3

## 2022-05-31 MED ORDER — ACETAMINOPHEN 650 MG RE SUPP: 650.0000 mg | Freq: Four times a day (QID) | RECTAL | Status: DC | PRN

## 2022-05-31 MED ORDER — TICAGRELOR 90 MG PO TABS
90.0000 mg | ORAL_TABLET | Freq: Two times a day (BID) | ORAL | Status: DC
  Administered 2022-05-31 – 2022-06-01 (×2): 90 mg via ORAL
  Filled 2022-05-31 (×2): qty 1

## 2022-05-31 MED ORDER — DAPAGLIFLOZIN PROPANEDIOL 10 MG PO TABS
10.0000 mg | ORAL_TABLET | Freq: Every day | ORAL | Status: DC
  Administered 2022-06-01: 10 mg via ORAL
  Filled 2022-05-31: qty 1

## 2022-05-31 MED ORDER — FENTANYL CITRATE PF 50 MCG/ML IJ SOSY
25.0000 ug | PREFILLED_SYRINGE | Freq: Once | INTRAMUSCULAR | Status: AC
  Administered 2022-05-31: 25 ug via INTRAVENOUS
  Filled 2022-05-31: qty 1

## 2022-05-31 MED ORDER — KETOROLAC TROMETHAMINE 15 MG/ML IJ SOLN
15.0000 mg | Freq: Once | INTRAMUSCULAR | Status: DC
  Filled 2022-05-31: qty 1

## 2022-05-31 MED ORDER — ASPIRIN 81 MG PO TBEC: 81.0000 mg | DELAYED_RELEASE_TABLET | Freq: Every morning | ORAL | Status: DC

## 2022-05-31 MED ORDER — ACETAMINOPHEN 325 MG PO TABS: 650.0000 mg | ORAL_TABLET | Freq: Four times a day (QID) | ORAL | Status: DC | PRN

## 2022-05-31 MED ORDER — ASPIRIN 81 MG PO TBEC: 81.0000 mg | DELAYED_RELEASE_TABLET | Freq: Every day | ORAL | Status: DC

## 2022-05-31 MED ORDER — IOHEXOL 350 MG/ML SOLN
80.0000 mL | Freq: Once | INTRAVENOUS | Status: AC | PRN
  Administered 2022-05-31: 80 mL via INTRAVENOUS

## 2022-05-31 MED ORDER — SPIRONOLACTONE 25 MG PO TABS
25.0000 mg | ORAL_TABLET | Freq: Every day | ORAL | Status: DC
  Administered 2022-06-01: 25 mg via ORAL
  Filled 2022-05-31: qty 1

## 2022-05-31 MED ORDER — COLCHICINE 0.6 MG PO TABS
0.6000 mg | ORAL_TABLET | Freq: Two times a day (BID) | ORAL | Status: DC
  Administered 2022-05-31 – 2022-06-01 (×2): 0.6 mg via ORAL
  Filled 2022-05-31 (×2): qty 1

## 2022-05-31 MED ORDER — ROSUVASTATIN CALCIUM 20 MG PO TABS
40.0000 mg | ORAL_TABLET | Freq: Every day | ORAL | Status: DC
  Administered 2022-06-01: 40 mg via ORAL
  Filled 2022-05-31: qty 2

## 2022-05-31 NOTE — Assessment & Plan Note (Signed)
 #)   History of coronary disease: Status post STEMI in March 2023, at which time patient underwent PCI with placement of 3 overlapping drug-eluting stents to the RCA.  Currently on dual antiplatelet therapy with daily baby aspirin as well as Brilinta twice daily, with most recent such as the latter occurring in the morning of 05/22/2022.  Patient cardiac medications for high intensity rosuvastatin.  Presentation appears less suggestive of ACS, as detailed above.  Plan: Resume home Dilaudid with next dose of Brilinta department this evening.  Resume antibiotics atorvastatin.  Monitor telemetry.  Cardiology has been consulted.

## 2022-05-31 NOTE — ED Triage Notes (Signed)
Patient here with complaint of chest tightness in the left of his chest that started last night and has not resolved. Tightness is exacerbated by deep inspiration. Patient is alert, oriented, speaking in complete sentences, is ambulatory and in no apparent distress at this time.

## 2022-05-31 NOTE — Telephone Encounter (Signed)
Pt notified to go to the ER for eval if needed per pt. He will keep a log of BP/HR for review

## 2022-05-31 NOTE — ED Notes (Signed)
Patient given food tray

## 2022-05-31 NOTE — Consult Note (Signed)
Cardiology Consultation:   Patient ID: Miguel Walker MRN: [id number]; DOB: 1941/05/27  Admit date: 05/31/2022 Date of Consult: 05/31/2022  PCP:  Miguel Pepper, MD   [facility] Providers Cardiologist:  Miguel Breeding, MD        Patient Profile:   Miguel Walker is a 81 y.o. male with a hx of CAD, ischemic cardiomyopathy, hypertension, hyperlipidemia, complete heart block, and prostate cancer who is being seen 05/31/2022 for the evaluation of chest pain at the request of Miguel Walker.  History of Present Illness:   Miguel Walker recently had a STEMI, but had been doing well at follow up after discharge. He presented to his PCP's office on 02/05/2022 with complaints of bilateral arm pain radiating to the neck.  EKG at PCP office showed ST elevation.  He was transferred emergently to [facility] ED where he underwent emergent cardiac catheterization which showed thrombotic occlusion of the RCA, treated with Aggrastat, aspiration thrombectomy multiple balloon inflations, and DES x3 overlapping.  He was also noted to have dLM 40 to 50% stenosis with 40% stenosed sidebranch in oLCx (not thought to be flow-limiting), mLAD 45%, and mLCx 55%.  Echocardiogram showed EF 40 to 45%, mild LVH, G2 DD, severe hypokinesis of LV, basal to mid inferoseptal wall and inferior wall, no significant valvular abnormalities, borderline dilation of ascending aorta, 38 mm.  No clinical signs of heart failure.  He was started on aspirin and Brilinta, metoprolol, Farxiga, and spironolactone.  ARB was not initiated in the setting of hypotension.  On 02/06/2022 he had third-degree heart block.  EP was consulted, given asymptomatic nature, there was no urgent indication for pacemaker. 2 weeks ZIO monitor with live monitor was placed prior to discharge. Recommendations were made for possible cardiac stress MRI to evaluate for ischemia with left main disease, following 4 to 6 weeks of recovery post MI, however, it was thought that further  intervention was likely not needed. He was discharged home in stable condition on 02/08/2022.  No high grade AVB on monitor, did see Mobitz I 2nd deg AVB.  He has been doing well and saw Miguel Walker on 6/13 at which point he was stable. Spironolactone titrated but entresto deferred due to hypotension.   Called into office 6/28 with chest pain "under his heart", advised to present to ED.  In ed, chest pain is low left precordial, worsened by deep inspiration. Troponins low x 2, (28, 26). Patinet appears comfortable on my exam and no hemodynamic distress.  Labs pertinent for non-negative d-dimer normal BMP, CBC. I requested inflammatory markers on consultation which are elevated both ESR 48 and CRP 8.9. EKG SR first deg AVB, T wave abnl inferiorly, poor R wave progression, and slight ST elevation high lateral I avL as well as V2. Telemetry with sinus rhythm and PVCs.  CT PE study obtained due to elevated d-dimer, neg for PE but shows possible empyema with loculated pleural effusion, and small pericardial effusion. No significant CT findings for constrictive physiology.    Past Medical History:  Diagnosis Date   Aneurysm, ascending aorta ([place])    Cancer ([place]) 1997   Prostate cancer   Coronary artery disease    Dyslipidemia    Family history of premature CAD    NO ISCHEMIC SYMPTOMS   Hyperlipidemia    Hypertension    Thyroid nodule     Past Surgical History:  Procedure Laterality Date   CARDIAC CATHETERIZATION     CARPAL TUNNEL RELEASE     CORONARY/GRAFT ACUTE MI  REVASCULARIZATION N/A 02/05/2022   Procedure: Coronary/Graft Acute MI Revascularization;  Surgeon: Miguel Man, MD;  Location: [facility];  Service: Cardiovascular;  Laterality: N/A;   HAND SURGERY     LEFT HEART CATH AND CORONARY ANGIOGRAPHY N/A 02/05/2022   Procedure: LEFT HEART CATH AND CORONARY ANGIOGRAPHY;  Surgeon: Miguel Man, MD;  Location: [facility];  Service: Cardiovascular;  Laterality:  N/A;   ROTATOR CUFF REPAIR     URINARY SPHINCTER IMPLANT       Home Medications:  Prior to Admission medications   Medication Sig Start Date End Date Taking? Authorizing Provider  ascorbic acid (VITAMIN C) 500 MG tablet Take 500 mg by mouth in the morning.    [provider]  aspirin EC 81 MG tablet Take 81 mg by mouth in the morning.    [provider]  BRILINTA 90 MG TABS tablet TAKE 1 TABLET BY MOUTH TWICE  DAILY 04/09/22   Miguel Breeding, MD  FARXIGA 10 MG TABS tablet TAKE 1 TABLET BY MOUTH DAILY 04/09/22   Miguel Breeding, MD  hydrocortisone 2.5 % cream Apply 1 application  topically 2 (two) times daily as needed (irritation.).    [provider]  metroNIDAZOLE (METROCREAM) 0.75 % cream Apply 1 application  topically 2 (two) times daily as needed (skin rash).    [provider]  Multiple Vitamin (MULTI-VITAMIN) tablet Take 1 tablet by mouth in the morning. Centrum Silver    [provider]  MYRBETRIQ 25 MG TB24 tablet Take 25 mg by mouth in the morning. 12/27/21   [provider]  nitroGLYCERIN (NITROSTAT) 0.4 MG SL tablet Place 1 tablet (0.4 mg total) under the tongue every 5 (five) minutes as needed for chest pain. 02/08/22 02/08/23  Miguel Deforest, PA  Omega-3 1000 MG CAPS Take 1,000 mg by mouth in the morning.    [provider]  rosuvastatin (CRESTOR) 40 MG tablet Take 1 tablet (40 mg total) by mouth daily. 02/27/22 05/28/22  Miguel Sciara, NP  spironolactone (ALDACTONE) 25 MG tablet Take 1 tablet (25 mg total) by mouth daily. 04/10/22   Miguel Breeding, MD  tolterodine (DETROL LA) 4 MG 24 hr capsule Take 4 mg by mouth in the morning. 04/12/22   [provider]  triamcinolone (KENALOG) 0.025 % cream Apply 1 application  topically 2 (two) times daily as needed (skin irritation).    [provider]    Inpatient Medications: Scheduled Meds:  fentaNYL (SUBLIMAZE) injection  50 mcg Intravenous Once   ketorolac  15 mg  Intravenous Once   Continuous Infusions:  PRN Meds:   Allergies:   No Known Allergies  Social History:   Social History   Socioeconomic History   Marital status: Married    Spouse name: Not on file   Number of children: 2   Years of education: 18   Highest education level: Master's degree (e.g., MA, MS, MEng, MEd, MSW, MBA)  Occupational History   Occupation: Retired Tourist information centre manager  Tobacco Use   Smoking status: Never   Smokeless tobacco: Never  Vaping Use   Vaping Use: Never used  Substance and Sexual Activity   Alcohol use: Yes    Alcohol/week: 3.0 standard drinks of alcohol    Types: 3 Glasses of wine per week    Comment: 3 glasses of red wine per week   Drug use: Never   Sexual activity: Not on file  Other Topics Concern   Not on file  Social History Narrative   Retired.  Mrried    Social Determinants of Radio broadcast assistant Strain: Not on file  Food Insecurity: Not on file  Transportation Needs: Not on file  Physical Activity: Not on file  Stress: Not on file  Social Connections: Not on file  Intimate Partner Violence: Not on file    Family History:    Family History  Problem Relation Age of Onset   CAD Father        S/P CABG age 102     ROS:  Please see the history of present illness.   All other ROS reviewed and negative.     Physical Exam/Data:   Vitals:   05/31/22 1823 05/31/22 1830 05/31/22 1900 05/31/22 1930  BP: 125/68 112/68 118/67 113/70  Pulse: 79 77 80 77  Resp: _0 Temp:      TempSrc:      SpO2: 100% 98% 97% 93%   No intake or output data in the 24 hours ending 05/31/22 2003    05/15/2022    3:35 PM 05/15/2022    9:03 AM 04/10/2022    1:18 PM  Last 3 Weights  Weight (lbs) 187 lb 9.6 oz 187 lb 2.7 oz 183 lb  Weight (kg) 85.095 kg 84.9 kg 83.008 kg     There is no height or weight on file to calculate BMI.  Constitutional: No acute distress Eyes: sclera non-icteric, normal conjunctiva and lids ENMT: normal  dentition, moist mucous membranes Cardiovascular: regular rhythm, normal rate, no murmur. No rub. S1 and S2 normal. No jugular venous distention.  Respiratory: clear to auscultation bilaterally GI : normal bowel sounds, soft and nontender. No distention.   MSK: extremities warm, well perfused. No edema.  NEURO: grossly nonfocal exam, moves all extremities. PSYCH: alert and oriented x 3, normal mood and affect.    EKG:  The EKG was personally reviewed and demonstrates:  SR 1st deg AVB, T wave inversion inferiorly and subtle ST elevation I avL V2, with PR elevation in avR.  Telemetry:  Telemetry was personally reviewed and demonstrates:  SR PVCs  Relevant CV Studies: N/a  Laboratory Data:  High Sensitivity Troponin:   Recent Labs  Lab 05/31/22 1220 05/31/22 1405  TROPONINIHS 28* 26*     Chemistry Recent Labs  Lab 05/31/22 1220  NA 137  K 4.7  CL 99  CO2 27  GLUCOSE 102*  BUN 13  CREATININE 1.06  CALCIUM 9.7  GFRNONAA >60  ANIONGAP 11    No results for input(s): "PROT", "ALBUMIN", "AST", "ALT", "ALKPHOS", "BILITOT" in the last 168 hours. Lipids No results for input(s): "CHOL", "TRIG", "HDL", "LABVLDL", "LDLCALC", "CHOLHDL" in the last 168 hours.  Hematology Recent Labs  Lab 05/31/22 1220  WBC 6.7  RBC 5.05  HGB 14.1  HCT 42.7  MCV 84.6  MCH 27.9  MCHC 33.0  RDW 14.8  PLT 257   Thyroid No results for input(s): "TSH", "FREET4" in the last 168 hours.  BNPNo results for input(s): "BNP", "PROBNP" in the last 168 hours.  DDimer  Recent Labs  Lab 05/31/22 1555  DDIMER 2.58*     Radiology/Studies:  CT Angio Chest PE W/Cm &/Or Wo Cm  Result Date: 05/31/2022 CLINICAL DATA:  Elevated D-dimer.  Chest pain. EXAM: CT ANGIOGRAPHY CHEST WITH CONTRAST TECHNIQUE: Multidetector CT imaging of the chest was performed using the standard protocol during bolus administration of intravenous contrast. Multiplanar CT image reconstructions and MIPs were obtained to  evaluate the  vascular anatomy. RADIATION DOSE REDUCTION: This exam was performed according to the departmental dose-optimization program which includes automated exposure control, adjustment of the mA and/or kV according to patient size and/or use of iterative reconstruction technique. CONTRAST:  80mL OMNIPAQUE IOHEXOL 350 MG/ML SOLN COMPARISON:  September 29, 2021 FINDINGS: Cardiovascular: Satisfactory opacification of the pulmonary arteries to the segmental level. No evidence of pulmonary embolism. Normal heart size. Pericardial thickening versus pericardial effusion measuring up to 1 cm. Calcific atherosclerotic disease of the coronary arteries. Mediastinum/Nodes: No enlarged mediastinal, hilar, or axillary lymph nodes. Thyroid gland, trachea, and esophagus demonstrate no significant findings. Lungs/Pleura: Loculated appearing pleural effusion in the left lung base with enhancement of the pleura. Ground-glass opacities in the dependent portions of the lungs may represent pulmonary edema or infectious consolidation. Upper Abdomen: No acute abnormality. Musculoskeletal: No chest wall abnormality. No acute or significant osseous findings. Review of the MIP images confirms the above findings. IMPRESSION: 1. No evidence of pulmonary embolism. 2. Pericardial thickening versus pericardial effusion measuring up to 1 cm. 3. Loculated appearing pleural effusion in the left lung base with enhancement of the pleura, which may represent empyema. 4. Ground-glass opacities in the dependent portions of the lungs may represent pulmonary edema or infectious consolidation. 5. Calcific atherosclerotic disease of the coronary arteries. 6. These results were called by telephone at the time of interpretation on 05/31/2022 at 6:36 pm to provider Dr. Lockwood, who verbally acknowledged these results. Electronically Signed   By: Dobrinka  Dimitrova M.D.   On: 05/31/2022 18:39   DG Chest 2 View  Result Date: 05/31/2022 CLINICAL DATA:  Chest pain EXAM:  CHEST - 2 VIEW COMPARISON:  CT chest 09/29/2021 FINDINGS: Heart size and vascularity normal.  Negative for heart failure Mild airspace disease in the left lung base with small left effusion. Right lung clear. IMPRESSION: Mild airspace disease left lung base with small left effusion. Possible pneumonia. Electronically Signed   By: Charles  Clark M.D.   On: 05/31/2022 12:30     Assessment and Plan:   Principal Problem:   Atypical chest pain Active Problems:   Hyperlipidemia LDL goal <70   History of CAD (coronary artery disease)   Chronic combined systolic and diastolic heart failure (HCC)  Likely pericarditis, effusion noted on CT but does not appear hemodynamically significant. Normotensive, normal HR, flat JVP. Remote for Dressler's, may represent viral pericarditis with pulmonary finding suggestive of infection. Procal negative. No signs/sx of infection currently. Unclear etiology. Pt comfortable currently, will observe for hemodynamic change. - check ESR, CRP - echo in am with respirometer for pericarditis/pericardial effusion.  - start colchicine 0.6 mg BID, with elevated inflammatory markers would continue for at least 3 mo or until inflammatory markers normalize/pain completely resolves. - troponin relatively unremarkable and he is having no significant chest pain except with deep inspiration.  - monitor for hemodynamic change. - continue DAPT, statin for recent ACS s/p PCI  Pt very grateful for the care from Dr. Hochrein, his primary cardiologist. If echo is unremarkable and no worsening of chest pain, can likely follow effusion in close follow up after observation in hospital.   Risk Assessment/Risk Scores:     HEAR Score (for undifferentiated chest pain):  5          For questions or updates, please contact [facility] Please consult [email or web address] for contact info under    Signed, Gayatri A Acharya, MD  05/31/2022 8:03 PM  

## 2022-05-31 NOTE — Assessment & Plan Note (Signed)
#)   Atypical chest pain: 1 day of intermittent achy nonradiating left-sided chest pain that has been nonexertional, but rather pleuritic in nature.  No interval administration of nitroglycerin.  Presentation appears suspicious for acute pericarditis given the pleuritic nature of chest pain, with elevation in CRP, along with CTA chest showing evidence of pericardial thickening with some evidence of ST elevation presenting EKG, which cardiology feels is more suggestive of acute pericarditis, and confirmed this mitral changes are not consistent with STEMI.  Overall, cardiology feels that presentation is less suggestive of ACS, and recommends overnight observation with echocardiogram, and initiation of colchicine for suspected acute pericarditis.  Currently chest pain free.  Of note, troponin x228, followed by 26, which is particular reassuring given the patient has been experiencing intermittent chest pain for approximately 24 hours.  The context of patient's STEMI in March 2023, may represent post-STEMI acute pericarditis.  Of note, CTA chest with PE protocol shows no evidence of acute pulmonary embolism, will imaging shows no evidence of pneumothorax.  While the patient does have history ascending thoracic aortic aneurysm, presentation and imaging are less suggestive of this possibility.   CTA chest also showed a small left pleural effusion with potential loculation, as well as groundglass opacities in dependent portions of the lungs, with radiology read stating that this may represent mild pulmonary edema versus infection.  Presentation appears less suggestive of infection, in the absence of any subjective fever, chills, rigors, or generalized myalgias.  BNP result currently pending, and will check procalcitonin level to further evaluate these possibilities.  will refrain from empiric antibiotics at this time.    Plan: Cardiology consulted.  Further recommendation, colchicine 0.6 mg p.o. twice daily.   Monitor on symmetry.  Add on serum magnesium level.  Echocardiogram in the morning.  Follow-up result BNP.  Add on procalcitonin level.  Follow-up results of ESR.  Check CMP and lipase.

## 2022-05-31 NOTE — Assessment & Plan Note (Signed)
  #)   Hyperlipidemia: documented h/o such. On high intensity rosuvastatin as outpatient.    Plan: continue home statin.

## 2022-05-31 NOTE — H&P (Signed)
History and Physical    PLEASE NOTE THAT DRAGON DICTATION SOFTWARE WAS USED IN THE CONSTRUCTION OF THIS NOTE.   Miguel Walker FFM:[id number] DOB: 06-07-1941 DOA: 05/31/2022  PCP: London Pepper, MD  Patient coming from: home   I have personally briefly reviewed patient's old medical records in [place]  Chief Complaint: Chest pain  HPI: Miguel Walker is a 81 y.o. male with medical history significant for coronary artery disease status post STEMI in March 2023, hypertension, hyperlipidemia, chronic systolic/diastolic heart failure, ascending thoracic aortic aneurysm, who is admitted to [facility] on 05/31/2022 with atypical chest pain after presenting from home to [facility] ED complaining of chest pain.   The patient reports 1 day of intermittent achy left-sided chest pain without radiation.  Reports that this discomfort is nonexertional, but worsens with deep inspiration.  Nonpositional, not reproducible with direct ovation over the anterior chest wall.  Has not tried any nitroglycerin since onset of this discomfort.  Denies any associated shortness of breath, palpitations, diaphoresis, nausea, vomiting, dizziness, syncope, or syncope.  Also denies any associated subjective fever, chills, rigors, generalized myalgias.  No recent trauma or travel.  No associated with any new onset peripheral edema, calf tenderness, or new lower extremity erythema.  Not associated with any recent cough.   Of note, the patient experienced a STEMI in March 2023, at which time he underwent left-sided coronary angiography on 02/05/2022, receiving PCI with 3 overlapping drug-eluting stents to the RCA at that time.  This procedure was performed at [facility].  He notes outstanding interval compliance with dual antiplatelet therapy via daily baby aspirin as well as Brilinta twice daily, noting last dose of the latter on the morning of 05/22/2022.  He is also on high intensity rosuvastatin as an outpatient.  Has  been compliant and routinely attending cardiac rehab.  Of note, patient currently chest pain-free.  Pressure review, most recent echocardiogram occurred on 02/06/2022, was notable for LVEF 40 to 45%, mild LVH, grade 2 diastolic dysfunction, moderately reduced right ventricular systolic function, and trivial mitral regurgitation.  Outpatient diuretic regimen limited to spironolactone.  Follows with Dr. Percival Spanish of [facility] cardiology.    ED Course:  Vital signs in the ED were notable for the following: Afebrile; heart rate 7083; blood pressure 127/81; respiratory rate 15-20, oxygen saturation 96 to 100% on room air.  Labs were notable for the following: BMP notable for the following: Sodium 4.7, creatinine 1.06.  High-sensitivity troponin I initially noted to be 20, 3 value trending down to 26.  CRP 8.9, ESR result currently pending.  D-dimer 2.58.  BNP currently pending.  CBC notable for white blood cell count 6 700, hemoglobin 14.1.  Imaging and additional notable ED work-up: EKG, in comparison to most recent prior from 05/15/2020 showed sinus rhythm with first-degree AV block, PR interval 238, otherwise, normal intervals, T wave inversion in leads III, aVF, and V6, which to inversion and 3 and aVF were unchanged relative to most recent prior EKG, with T wave inversion in V6.  Slightly more prominent relative to EKG from 05/16/2019..  Additionally, EKG showed nonspecific less than 1 mm ST elevation in V2 and V5, which appears new relative to most recent prior EKG.  Chest x-ray shows mild airspace opacity in the left lung base, suggestive of atelectasis versus infiltrate, as well as small left pleural effusion.  CTA chest pulmonary embolism protocol showed no evidence of acute PE, will demonstrating evidence of pericardial thickening versus pericardial effusion as well  as small left pleural effusion with potential loculation in addition to groundglass opacities in the dependent portion of the bilateral  lungs, potentially representing pulmonary edema versus infection.  EDP discussed the patient's case with cardiology, Dr. Percival Spanish, Who reviewed EKG, and conveyed that EKG appears inconsistent with STEMI, but rather that this presentation appears more suggestive of acute pericarditis, recommending admission to the hospital service for further evaluation management presenting atypical chest pain as well as initiation of Toradol followed by colchicine.  Cardiology to consult, with additional recommendations pending at this time, in addition to cardiology recommendation for pursuit of echocardiogram.  While in the ED, the following were administered: Aspirin 243 mg p.o. x1, fentanyl 25 mg IV x1, Toradol 15 mg IV x1.  Subsequently, the patient was admitted for overnight observation for further evaluation management of presenting atypical chest pain, with concern for acute pericarditis.    Review of Systems: As per HPI otherwise 10 point review of systems negative.   Past Medical History:  Diagnosis Date   Aneurysm, ascending aorta ([place])    Cancer ([place]) 1997   Prostate cancer   Coronary artery disease    Dyslipidemia    Family history of premature CAD    NO ISCHEMIC SYMPTOMS   Hyperlipidemia    Hypertension    Thyroid nodule     Past Surgical History:  Procedure Laterality Date   CARDIAC CATHETERIZATION     CARPAL TUNNEL RELEASE     CORONARY/GRAFT ACUTE MI REVASCULARIZATION N/A 02/05/2022   Procedure: Coronary/Graft Acute MI Revascularization;  Surgeon: Leonie Man, MD;  Location: [place] [facility];  Service: Cardiovascular;  Laterality: N/A;   HAND SURGERY     LEFT HEART CATH AND CORONARY ANGIOGRAPHY N/A 02/05/2022   Procedure: LEFT HEART CATH AND CORONARY ANGIOGRAPHY;  Surgeon: Leonie Man, MD;  Location: [place] [facility];  Service: Cardiovascular;  Laterality: N/A;   ROTATOR CUFF REPAIR     URINARY SPHINCTER IMPLANT      Social History:  reports that he has never  smoked. He has never used smokeless tobacco. He reports current alcohol use of about 3.0 standard drinks of alcohol per week. He reports that he does not use drugs.   No Known Allergies  Family History  Problem Relation Age of Onset   CAD Father        S/P CABG age 70    Family history reviewed and not pertinent    Prior to Admission medications   Medication Sig Start Date End Date Taking? Authorizing Provider  ascorbic acid (VITAMIN C) 500 MG tablet Take 500 mg by mouth in the morning.    [provider]  aspirin EC 81 MG tablet Take 81 mg by mouth in the morning.    [provider]  BRILINTA 90 MG TABS tablet TAKE 1 TABLET BY MOUTH TWICE  DAILY 04/09/22   Minus Breeding, MD  FARXIGA 10 MG TABS tablet TAKE 1 TABLET BY MOUTH DAILY 04/09/22   Minus Breeding, MD  hydrocortisone 2.5 % cream Apply 1 application  topically 2 (two) times daily as needed (irritation.).    [provider]  metroNIDAZOLE (METROCREAM) 0.75 % cream Apply 1 application  topically 2 (two) times daily as needed (skin rash).    [provider]  Multiple Vitamin (MULTI-VITAMIN) tablet Take 1 tablet by mouth in the morning. Centrum Silver    [provider]  MYRBETRIQ 25 MG TB24 tablet Take 25 mg by mouth in the morning. 12/27/21  [provider]  nitroGLYCERIN (NITROSTAT) 0.4 MG SL tablet Place 1 tablet (0.4 mg total) under the tongue every 5 (five) minutes as needed for chest pain. 02/08/22 02/08/23  Almyra Deforest, PA  Omega-3 1000 MG CAPS Take 1,000 mg by mouth in the morning.    [provider]  rosuvastatin (CRESTOR) 40 MG tablet Take 1 tablet (40 mg total) by mouth daily. 02/27/22 05/28/22  Lenna Sciara, NP  spironolactone (ALDACTONE) 25 MG tablet Take 1 tablet (25 mg total) by mouth daily. 04/10/22   Minus Breeding, MD  tolterodine (DETROL LA) 4 MG 24 hr capsule Take 4 mg by mouth in the morning. 04/12/22   [provider]  triamcinolone (KENALOG) 0.025 %  cream Apply 1 application  topically 2 (two) times daily as needed (skin irritation).    [provider]     Objective    Physical Exam: Vitals:   05/31/22 2000 05/31/22 2015 05/31/22 2030 05/31/22 2130  BP: 126/73  107/67 106/63  Pulse: 82 82 74 84  Resp: $Remo'16 13 19 20  'QpkqU$ Temp:      TempSrc:      SpO2: 97% 94% 94% 95%    General: appears to be stated age; alert, oriented Skin: warm, dry, no rash Head:  AT/[place] Mouth:  Oral mucosa membranes appear moist, normal dentition Neck: supple; trachea midline Heart:  RRR; did not appreciate any M/R/G Lungs: CTAB, did not appreciate any wheezes, rales, or rhonchi Abdomen: + BS; soft, ND, NT Vascular: 2+ pedal pulses b/l; 2+ radial pulses b/l Extremities: no peripheral edema, no muscle wasting Neuro: strength and sensation intact in upper and lower extremities b/l   Labs on Admission: I have personally reviewed following labs and imaging studies  CBC: Recent Labs  Lab 05/31/22 1220  WBC 6.7  HGB 14.1  HCT 42.7  MCV 84.6  PLT 356   Basic Metabolic Panel: Recent Labs  Lab 05/31/22 1220 05/31/22 1944  NA 137  --   K 4.7  --   CL 99  --   CO2 27  --   GLUCOSE 102*  --   BUN 13  --   CREATININE 1.06  --   CALCIUM 9.7  --   MG  --  2.4   GFR: CrCl cannot be calculated (Unknown ideal weight.). Liver Function Tests: No results for input(s): "AST", "ALT", "ALKPHOS", "BILITOT", "PROT", "ALBUMIN" in the last 168 hours. No results for input(s): "LIPASE", "AMYLASE" in the last 168 hours. No results for input(s): "AMMONIA" in the last 168 hours. Coagulation Profile: No results for input(s): "INR", "PROTIME" in the last 168 hours. Cardiac Enzymes: No results for input(s): "CKTOTAL", "CKMB", "CKMBINDEX", "TROPONINI" in the last 168 hours. BNP (last 3 results) No results for input(s): "PROBNP" in the last 8760 hours. HbA1C: No results for input(s): "HGBA1C" in the last 72 hours. CBG: No results for input(s): "GLUCAP"  in the last 168 hours. Lipid Profile: No results for input(s): "CHOL", "HDL", "LDLCALC", "TRIG", "CHOLHDL", "LDLDIRECT" in the last 72 hours. Thyroid Function Tests: No results for input(s): "TSH", "T4TOTAL", "FREET4", "T3FREE", "THYROIDAB" in the last 72 hours. Anemia Panel: No results for input(s): "VITAMINB12", "FOLATE", "FERRITIN", "TIBC", "IRON", "RETICCTPCT" in the last 72 hours. Urine analysis: No results found for: "COLORURINE", "APPEARANCEUR", "LABSPEC", "PHURINE", "GLUCOSEU", "HGBUR", "BILIRUBINUR", "KETONESUR", "PROTEINUR", "UROBILINOGEN", "NITRITE", "LEUKOCYTESUR"  Radiological Exams on Admission: CT Angio Chest PE W/Cm &/Or Wo Cm  Result Date: 05/31/2022 CLINICAL DATA:  Elevated D-dimer.  Chest pain. EXAM: CT ANGIOGRAPHY  CHEST WITH CONTRAST TECHNIQUE: Multidetector CT imaging of the chest was performed using the standard protocol during bolus administration of intravenous contrast. Multiplanar CT image reconstructions and MIPs were obtained to evaluate the vascular anatomy. RADIATION DOSE REDUCTION: This exam was performed according to the departmental dose-optimization program which includes automated exposure control, adjustment of the mA and/or kV according to patient size and/or use of iterative reconstruction technique. CONTRAST:  50mL OMNIPAQUE IOHEXOL 350 MG/ML SOLN COMPARISON:  September 29, 2021 FINDINGS: Cardiovascular: Satisfactory opacification of the pulmonary arteries to the segmental level. No evidence of pulmonary embolism. Normal heart size. Pericardial thickening versus pericardial effusion measuring up to 1 cm. Calcific atherosclerotic disease of the coronary arteries. Mediastinum/Nodes: No enlarged mediastinal, hilar, or axillary lymph nodes. Thyroid gland, trachea, and esophagus demonstrate no significant findings. Lungs/Pleura: Loculated appearing pleural effusion in the left lung base with enhancement of the pleura. Ground-glass opacities in the dependent portions of  the lungs may represent pulmonary edema or infectious consolidation. Upper Abdomen: No acute abnormality. Musculoskeletal: No chest wall abnormality. No acute or significant osseous findings. Review of the MIP images confirms the above findings. IMPRESSION: 1. No evidence of pulmonary embolism. 2. Pericardial thickening versus pericardial effusion measuring up to 1 cm. 3. Loculated appearing pleural effusion in the left lung base with enhancement of the pleura, which may represent empyema. 4. Ground-glass opacities in the dependent portions of the lungs may represent pulmonary edema or infectious consolidation. 5. Calcific atherosclerotic disease of the coronary arteries. 6. These results were called by telephone at the time of interpretation on 05/31/2022 at 6:36 pm to provider Dr. Vanita Panda, who verbally acknowledged these results. Electronically Signed   By: Fidela Salisbury M.D.   On: 05/31/2022 18:39   DG Chest 2 View  Result Date: 05/31/2022 CLINICAL DATA:  Chest pain EXAM: CHEST - 2 VIEW COMPARISON:  CT chest 09/29/2021 FINDINGS: Heart size and vascularity normal.  Negative for heart failure Mild airspace disease in the left lung base with small left effusion. Right lung clear. IMPRESSION: Mild airspace disease left lung base with small left effusion. Possible pneumonia. Electronically Signed   By: Franchot Gallo M.D.   On: 05/31/2022 12:30     EKG: Independently reviewed, with result as described above.    Assessment/Plan   Principal Problem:   Atypical chest pain Active Problems:   Hyperlipidemia LDL goal <70   History of CAD (coronary artery disease)   Chronic combined systolic and diastolic heart failure (HCC)      #) Atypical chest pain: 1 day of intermittent achy nonradiating left-sided chest pain that has been nonexertional, but rather pleuritic in nature.  No interval administration of nitroglycerin.  Presentation appears suspicious for acute pericarditis given the pleuritic  nature of chest pain, with elevation in CRP, along with CTA chest showing evidence of pericardial thickening with some evidence of ST elevation presenting EKG, which cardiology feels is more suggestive of acute pericarditis, and confirmed this mitral changes are not consistent with STEMI.  Overall, cardiology feels that presentation is less suggestive of ACS, and recommends overnight observation with echocardiogram, and initiation of colchicine for suspected acute pericarditis.  Currently chest pain free.  Of note, troponin x228, followed by 26, which is particular reassuring given the patient has been experiencing intermittent chest pain for approximately 24 hours.  The context of patient's STEMI in March 2023, may represent post-STEMI acute pericarditis.  Of note, CTA chest with PE protocol shows no evidence of acute pulmonary embolism, will imaging shows  no evidence of pneumothorax.  While the patient does have history ascending thoracic aortic aneurysm, presentation and imaging are less suggestive of this possibility.   CTA chest also showed a small left pleural effusion with potential loculation, as well as groundglass opacities in dependent portions of the lungs, with radiology read stating that this may represent mild pulmonary edema versus infection.  Presentation appears less suggestive of infection, in the absence of any subjective fever, chills, rigors, or generalized myalgias.  BNP result currently pending, and will check procalcitonin level to further evaluate these possibilities.  will refrain from empiric antibiotics at this time.    Plan: Cardiology consulted.  Further recommendation, colchicine 0.6 mg p.o. twice daily.  Monitor on symmetry.  Add on serum magnesium level.  Echocardiogram in the morning.  Follow-up result BNP.  Add on procalcitonin level.  Follow-up results of ESR.  Check CMP and lipase.        #) History of coronary disease: Status post STEMI in March 2023, at which  time patient underwent PCI with placement of 3 overlapping drug-eluting stents to the RCA.  Currently on dual antiplatelet therapy with daily baby aspirin as well as Brilinta twice daily, with most recent such as the latter occurring in the morning of 05/22/2022.  Patient cardiac medications for high intensity rosuvastatin.  Presentation appears less suggestive of ACS, as detailed above.  Plan: Resume home Dilaudid with next dose of Brilinta department this evening.  Resume antibiotics atorvastatin.  Monitor telemetry.  Cardiology has been consulted.           #) Chronic combined systolic and diastolic heart failure: documented history of such, with most recent echocardiogram performed in March 2023 notable for LVEF 40 to 45% as well as grade 2 diastolic dysfunction.  Additionally, his echocardiogram also showed evidence of moderately reduced right ventricular systolic function.  Consequently, we will refrain from any aggressive IV diuresis efforts at this time, particularly while echocardiogram is pending to evaluate for any contribution from pericardial effusion.   Will symptomatically, presentation process just acutely decompensated heart failure, CTA chest shows evidence of groundglass opacities in the mid portions of bibasilar lung, potentially representing mild pulmonary edema versus infection.  BNP as well as procalcitonin levels to further evaluate these possibilities are currently pending.  Patient's home diuretic regimen reportedly consists of the following: Spironolactone.  Outpatient cardiac medications also include dapagliflozin.     Plan: monitor strict I's & O's and daily weights. Repeat BMP in the morning. Check serum magnesium level. Continue home diuretic regimen. Continue home dapagliflozin.  As recommended by cardiology, echocardiogram has been ordered for the morning.  Follow-up results of BNP as well as procalcitonin level.            #) Hyperlipidemia: documented  h/o such. On high intensity rosuvastatin as outpatient.    Plan: continue home statin.        DVT prophylaxis: SCD's   Code Status: Full code Family Communication: none Disposition Plan: Per Rounding Team Consults called: EDP discussed patient's case with Dr. Percival Spanish of Cardiology, who will formally consult, as further details We will;  Admission status: Observation; cardiac telemetry.   PLEASE NOTE THAT DRAGON DICTATION SOFTWARE WAS USED IN THE CONSTRUCTION OF THIS NOTE.   [place] DO [facility]  From [place]   05/31/2022, 10:10 PM

## 2022-05-31 NOTE — Assessment & Plan Note (Signed)
  #)   Chronic combined systolic and diastolic heart failure: documented history of such, with most recent echocardiogram performed in March 2023 notable for LVEF 40 to 45% as well as grade 2 diastolic dysfunction.  Additionally, his echocardiogram also showed evidence of moderately reduced right ventricular systolic function.  Consequently, we will refrain from any aggressive IV diuresis efforts at this time, particularly while echocardiogram is pending to evaluate for any contribution from pericardial effusion.   Will symptomatically, presentation process just acutely decompensated heart failure, CTA chest shows evidence of groundglass opacities in the mid portions of bibasilar lung, potentially representing mild pulmonary edema versus infection.  BNP as well as procalcitonin levels to further evaluate these possibilities are currently pending.  Patient's home diuretic regimen reportedly consists of the following: Spironolactone.  Outpatient cardiac medications also include dapagliflozin.     Plan: monitor strict I's & O's and daily weights. Repeat BMP in the morning. Check serum magnesium level. Continue home diuretic regimen. Continue home dapagliflozin.  As recommended by cardiology, echocardiogram has been ordered for the morning.  Follow-up results of BNP as well as procalcitonin level.

## 2022-05-31 NOTE — ED Notes (Signed)
Patient transported to CT 

## 2022-05-31 NOTE — Telephone Encounter (Signed)
Pt states that he has a tightening in his chest that's been going on since last night. Pt states that it feels like it is under his heart. Pt says that he has loss of energy and feels tired. Pt did start Cardiac Rehab. He would like a callback from the nurse. Please advise

## 2022-05-31 NOTE — ED Provider Notes (Signed)
[facility] EMERGENCY DEPARTMENT Provider Note   CSN: [id number] Arrival date & time: 05/31/22  1152     History  Chief Complaint  Patient presents with   Chest Pain    Miguel Walker is a 81 y.o. male.   Chest Pain Patient is a 81 year old male with past medical history significant for STEMI 3 months ago status post 3 stents placement, HLD, HTN, I do see a history notation of thoracic aortic aneurysm however on imaging 09/21/2021 this was not present.   Patient states that around 10 PM last night chest pain began and seem to occur intermittently especially with deep breathing or cough.  His pain will last for a few seconds at a time he describes as sharp nonradiating left-sided underneath his left pectoral muscle.  He states that coughing sneezing and laughing also elicits pain.  Seems to happen every 5 minutes .  No NV. No SOB.  No sweating.   Feels fatigued. In cardiac rehab and doing well but states feels worse than his baseline today.     Home Medications Prior to Admission medications   Medication Sig Start Date End Date Taking? Authorizing Provider  ascorbic acid (VITAMIN C) 500 MG tablet Take 500 mg by mouth in the morning.    [provider]  aspirin EC 81 MG tablet Take 81 mg by mouth in the morning.    [provider]  BRILINTA 90 MG TABS tablet TAKE 1 TABLET BY MOUTH TWICE  DAILY 04/09/22   Minus Breeding, MD  FARXIGA 10 MG TABS tablet TAKE 1 TABLET BY MOUTH DAILY 04/09/22   Minus Breeding, MD  hydrocortisone 2.5 % cream Apply 1 application  topically 2 (two) times daily as needed (irritation.).    [provider]  metroNIDAZOLE (METROCREAM) 0.75 % cream Apply 1 application  topically 2 (two) times daily as needed (skin rash).    [provider]  Multiple Vitamin (MULTI-VITAMIN) tablet Take 1 tablet by mouth in the morning. Centrum Silver    [provider]  MYRBETRIQ 25 MG TB24 tablet Take 25 mg by  mouth in the morning. 12/27/21   [provider]  nitroGLYCERIN (NITROSTAT) 0.4 MG SL tablet Place 1 tablet (0.4 mg total) under the tongue every 5 (five) minutes as needed for chest pain. 02/08/22 02/08/23  Almyra Deforest, PA  Omega-3 1000 MG CAPS Take 1,000 mg by mouth in the morning.    [provider]  rosuvastatin (CRESTOR) 40 MG tablet Take 1 tablet (40 mg total) by mouth daily. 02/27/22 05/28/22  Lenna Sciara, NP  spironolactone (ALDACTONE) 25 MG tablet Take 1 tablet (25 mg total) by mouth daily. 04/10/22   Minus Breeding, MD  tolterodine (DETROL LA) 4 MG 24 hr capsule Take 4 mg by mouth in the morning. 04/12/22   [provider]  triamcinolone (KENALOG) 0.025 % cream Apply 1 application  topically 2 (two) times daily as needed (skin irritation).    [provider]      Allergies    Patient has no known allergies.    Review of Systems   Review of Systems  Cardiovascular:  Positive for chest pain.    Physical Exam Updated Vital Signs BP 114/71   Pulse 80   Temp 98.3 F (36.8 C) (Oral)   Resp 17   SpO2 99%  Physical Exam Vitals and nursing note reviewed.  Constitutional:      General: He is not in acute distress.  Comments: Is an 81 year old male in no acute distress but uncomfortable occasionally.  HENT:     Head: Normocephalic and atraumatic.     Nose: Nose normal.  Eyes:     General: No scleral icterus. Cardiovascular:     Rate and Rhythm: Normal rate and regular rhythm.     Pulses: Normal pulses.     Heart sounds: Normal heart sounds.  Pulmonary:     Effort: Pulmonary effort is normal. No respiratory distress.     Breath sounds: No wheezing.  Abdominal:     Palpations: Abdomen is soft.     Tenderness: There is no abdominal tenderness.  Musculoskeletal:     Cervical back: Normal range of motion.     Right lower leg: No edema.     Left lower leg: No edema.     Comments: No lower extremity edema or calf tenderness  Skin:    General:  Skin is warm and dry.     Capillary Refill: Capillary refill takes less than 2 seconds.  Neurological:     Mental Status: He is alert. Mental status is at baseline.  Psychiatric:        Mood and Affect: Mood normal.        Behavior: Behavior normal.     ED Results / Procedures / Treatments   Labs (all labs ordered are listed, but only abnormal results are displayed) Labs Reviewed  BASIC METABOLIC PANEL - Abnormal; Notable for the following components:      Result Value   Glucose, Bld 102 (*)    All other components within normal limits  TROPONIN I (HIGH SENSITIVITY) - Abnormal; Notable for the following components:   Troponin I (High Sensitivity) 28 (*)    All other components within normal limits  TROPONIN I (HIGH SENSITIVITY) - Abnormal; Notable for the following components:   Troponin I (High Sensitivity) 26 (*)    All other components within normal limits  CBC  D-DIMER, QUANTITATIVE    EKG EKG Interpretation  Date/Time:  Thursday May 31 2022 11:59:46 EDT Ventricular Rate:  91 PR Interval:  230 QRS Duration: 80 QT Interval:  346 QTC Calculation: 425 R Axis:   48 Text Interpretation: Sinus rhythm with 1st degree A-V block Low voltage QRS Cannot rule out Anterior infarct , age undetermined T wave abnormality, consider inferior ischemia Abnormal ECG When compared with ECG of 08-Feb-2022 11:38, PREVIOUS ECG IS PRESENT Similar to March 2023 tracing Confirmed by Nanda Quinton [phone]) on 05/31/2022 1:48:04 PM  Radiology DG Chest 2 View  Result Date: 05/31/2022 CLINICAL DATA:  Chest pain EXAM: CHEST - 2 VIEW COMPARISON:  CT chest 09/29/2021 FINDINGS: Heart size and vascularity normal.  Negative for heart failure Mild airspace disease in the left lung base with small left effusion. Right lung clear. IMPRESSION: Mild airspace disease left lung base with small left effusion. Possible pneumonia. Electronically Signed   By: Franchot Gallo M.D.   On: 05/31/2022 12:30     Procedures Ultrasound ED Peripheral IV (Provider)  Date/Time: 05/31/2022 3:40 PM  Performed by: Tedd Sias, PA Authorized by: Tedd Sias, PA   Procedure details:    Patient tolerated procedure without complications: Yes     Dressing applied: Yes   Comments:     20-gauge IV placed in right AC using ultrasound guidance.  1 attempt.  Multiple failed IV attempts by RN.  Patient tolerated suture well.  Imaging not archived.     Medications Ordered in ED Medications  aspirin chewable tablet 243 mg (243 mg Oral Given 05/31/22 1451)  fentaNYL (SUBLIMAZE) injection 25 mcg (25 mcg Intravenous Given 05/31/22 1451)    ED Course/ Medical Decision Making/ A&P Clinical Course as of 05/31/22 1608  Thu May 31, 2022  1431 10PM CP that occurs with deep breathing or cough. A few seconds of pain. Sharp and non-radiating left sided and 6/10. Also w laughing.   Every 5 minutes.   No NV. No SOB.  No sweating.   Feels fatigued. In cardiac rehab and doing well but states feels worse.  [WF]  [address] drive to [place] - 9.5 hours.   No cancer other than '98 prostate CA (now removed). [WF]  43 Discussed case w/ Dr. Percival Spanish who reviewed EKG.   Slight ST elevation diffusely - new Q waves.  ? Pericarditis [WF]    Clinical Course User Index [WF] Tedd Sias, PA                           Medical Decision Making Amount and/or Complexity of Data Reviewed Labs: ordered. Radiology: ordered.  Risk OTC drugs. Prescription drug management.   This patient presents to the ED for concern of chest pain, this involves a number of treatment options, and is a complaint that carries with it a high risk of complications and morbidity.  The emergent causes of chest pain include: Acute coronary syndrome, tamponade, pericarditis/myocarditis, aortic dissection, pulmonary embolism, tension pneumothorax, pneumonia, and esophageal rupture.    Co morbidities: Discussed in HPI   Brief  History:  Patient is a 81 year old male with past medical history significant for STEMI 3 months ago status post 3 stents placement, HLD, HTN, I do see a history notation of thoracic aortic aneurysm however on imaging 09/21/2021 this was not present.   Patient states that around 10 PM last night chest pain began and seem to occur intermittently especially with deep breathing or cough.  His pain will last for a few seconds at a time he describes as sharp nonradiating left-sided underneath his left pectoral muscle.  He states that coughing sneezing and laughing also elicits pain.  Seems to happen every 5 minutes .  No NV. No SOB.  No sweating.   Feels fatigued. In cardiac rehab and doing well but states feels worse than his baseline today.    EMR reviewed including pt PMHx, past surgical history and past visits to ER.   See HPI for more details   Lab Tests:   I ordered and independently interpreted labs. Labs notable for troponin mildly elevated at 28, second troponin is 26.  BMP unremarkable CBC unremarkable.  D-dimer pending at time of handoff.   Imaging Studies:  Abnormal findings. I personally reviewed all imaging studies. Imaging notable for radiology with?  Pneumonia. Patient is afebrile, he denies coughing more than usual and has no leukocytosis.  I have a very low suspicion for pneumonia.   Cardiac Monitoring:  The patient was maintained on a cardiac monitor.  I personally viewed and interpreted the cardiac monitored which showed an underlying rhythm of: NSR EKG non-ischemic   Medicines ordered:  I ordered medication including aspirin, fentanyl for pain Reevaluation of the patient after these medicines showed that the patient improved I have reviewed the patients home medicines and have made adjustments as needed   Critical Interventions:     Consults/Attending Physician   I requested consultation with Dr. Percival Spanish cardiology,  and discussed lab  and imaging  findings as well as pertinent plan - they recommend: Dr. Percival Spanish of cards reviewed EKG and does see some mild diffuse ST elevation consistent with pericarditis.  He recommends delta troponin and agrees with my work-up of PE.  Recommends if work-up reassuring and flat troponins discharge home with close follow-up with cardiology--from a cardiology standpoint.   Reevaluation:  After the interventions noted above I re-evaluated patient and found that they have :improved   Social Determinants of Health:      Problem List / ED Course:   - Chest pain -consider pneumonia which I think unlikely, PE with D-dimer pending, and pericarditis.   Dispostion: Patient care handed off to B Henderly follow-up on PET scan.  Anticipate discharge home if work-up reassuring/negative.  Final Clinical Impression(s) / ED Diagnoses Final diagnoses:  None    Rx / DC Orders ED Discharge Orders     None         Tedd Sias, [place] 05/31/22 1608    Margette Fast, MD 06/01/22 [phone]

## 2022-05-31 NOTE — ED Provider Notes (Signed)
81 year old recent STEMI 3 months ago, thoracic aortic aneurysm, ischemic cardiomyopathy here for evaluation of chest tightness began yesterday.  Worse with deep inspiration.  Previous provider spoke with Dr. Percival Spanish patient's cardiologist.  If troponins flat patient can discharge home.  Follow-up on D-dimer  X-ray shows possible pneumonia   Physical Exam  BP 126/73   Pulse 82   Temp 98.3 F (36.8 C) (Oral)   Resp 16   SpO2 97%   Physical Exam Vitals and nursing note reviewed.  Constitutional:      General: He is not in acute distress.    Appearance: He is well-developed. He is not ill-appearing, toxic-appearing or diaphoretic.  HENT:     Head: Atraumatic.  Eyes:     Pupils: Pupils are equal, round, and reactive to light.  Cardiovascular:     Rate and Rhythm: Normal rate and regular rhythm.     Pulses: Normal pulses.          Radial pulses are 2+ on the right side and 2+ on the left side.       Dorsalis pedis pulses are 2+ on the right side and 2+ on the left side.  Pulmonary:     Effort: Pulmonary effort is normal. No respiratory distress.  Abdominal:     General: There is no distension.     Palpations: Abdomen is soft.  Musculoskeletal:        General: Normal range of motion.     Cervical back: Normal range of motion and neck supple.  Skin:    General: Skin is warm and dry.  Neurological:     General: No focal deficit present.     Mental Status: He is alert and oriented to person, place, and time.     Procedures  .Critical Care  Performed by: Nettie Elm, PA-C Authorized by: Nettie Elm, PA-C   Critical care provider statement:    Critical care time (minutes):  35   Critical care was necessary to treat or prevent imminent or life-threatening deterioration of the following conditions:  Cardiac failure and circulatory failure   Critical care was time spent personally by me on the following activities:  Development of treatment plan with patient or  surrogate, discussions with consultants, evaluation of patient's response to treatment, examination of patient, ordering and review of laboratory studies, ordering and review of radiographic studies, ordering and performing treatments and interventions, pulse oximetry, re-evaluation of patient's condition and review of old charts  Labs Reviewed  BASIC METABOLIC PANEL - Abnormal; Notable for the following components:      Result Value   Glucose, Bld 102 (*)    All other components within normal limits  D-DIMER, QUANTITATIVE - Abnormal; Notable for the following components:   D-Dimer, Quant 2.58 (*)    All other components within normal limits  TROPONIN I (HIGH SENSITIVITY) - Abnormal; Notable for the following components:   Troponin I (High Sensitivity) 28 (*)    All other components within normal limits  TROPONIN I (HIGH SENSITIVITY) - Abnormal; Notable for the following components:   Troponin I (High Sensitivity) 26 (*)    All other components within normal limits  SARS CORONAVIRUS 2 BY RT PCR  CBC  BRAIN NATRIURETIC PEPTIDE  SEDIMENTATION RATE  C-REACTIVE PROTEIN   CT Angio Chest PE W/Cm &/Or Wo Cm  Result Date: 05/31/2022 CLINICAL DATA:  Elevated D-dimer.  Chest pain. EXAM: CT ANGIOGRAPHY CHEST WITH CONTRAST TECHNIQUE: Multidetector CT imaging of the chest was performed  using the standard protocol during bolus administration of intravenous contrast. Multiplanar CT image reconstructions and MIPs were obtained to evaluate the vascular anatomy. RADIATION DOSE REDUCTION: This exam was performed according to the departmental dose-optimization program which includes automated exposure control, adjustment of the mA and/or kV according to patient size and/or use of iterative reconstruction technique. CONTRAST:  71m OMNIPAQUE IOHEXOL 350 MG/ML SOLN COMPARISON:  September 29, 2021 FINDINGS: Cardiovascular: Satisfactory opacification of the pulmonary arteries to the segmental level. No evidence of  pulmonary embolism. Normal heart size. Pericardial thickening versus pericardial effusion measuring up to 1 cm. Calcific atherosclerotic disease of the coronary arteries. Mediastinum/Nodes: No enlarged mediastinal, hilar, or axillary lymph nodes. Thyroid gland, trachea, and esophagus demonstrate no significant findings. Lungs/Pleura: Loculated appearing pleural effusion in the left lung base with enhancement of the pleura. Ground-glass opacities in the dependent portions of the lungs may represent pulmonary edema or infectious consolidation. Upper Abdomen: No acute abnormality. Musculoskeletal: No chest wall abnormality. No acute or significant osseous findings. Review of the MIP images confirms the above findings. IMPRESSION: 1. No evidence of pulmonary embolism. 2. Pericardial thickening versus pericardial effusion measuring up to 1 cm. 3. Loculated appearing pleural effusion in the left lung base with enhancement of the pleura, which may represent empyema. 4. Ground-glass opacities in the dependent portions of the lungs may represent pulmonary edema or infectious consolidation. 5. Calcific atherosclerotic disease of the coronary arteries. 6. These results were called by telephone at the time of interpretation on 05/31/2022 at 6:36 pm to provider Dr. LVanita Panda who verbally acknowledged these results. Electronically Signed   By: DFidela SalisburyM.D.   On: 05/31/2022 18:39   DG Chest 2 View  Result Date: 05/31/2022 CLINICAL DATA:  Chest pain EXAM: CHEST - 2 VIEW COMPARISON:  CT chest 09/29/2021 FINDINGS: Heart size and vascularity normal.  Negative for heart failure Mild airspace disease in the left lung base with small left effusion. Right lung clear. IMPRESSION: Mild airspace disease left lung base with small left effusion. Possible pneumonia. Electronically Signed   By: CFranchot GalloM.D.   On: 05/31/2022 12:30    ED Course / MDM   Clinical Course as of 05/31/22 2028  Thu May 31, 2022  1431 10PM CP  that occurs with deep breathing or cough. A few seconds of pain. Sharp and non-radiating left sided and 6/10. Also w laughing.   Every 5 minutes.   No NV. No SOB.  No sweating.   Feels fatigued. In cardiac rehab and doing well but states feels worse.  [WF]  14[address] drive to [place] - 9.5 hours.   No cancer other than '98 prostate CA (now removed). [WF]  14Discussed case w/ Dr. HPercival Spanishwho reviewed EKG.   Slight ST elevation diffusely - new Q waves.  ? Pericarditis [WF]  1622 Trop flat [BH]    Clinical Course User Index [BH] Eliane Hammersmith A, PA-C [WF] FTedd Sias PA   Medical Decision Making Amount and/or Complexity of Data Reviewed External Data Reviewed: labs, radiology, ECG and notes. Labs: ordered. Decision-making details documented in ED Course. Radiology: ordered and independent interpretation performed. Decision-making details documented in ED Course. ECG/medicine tests: ordered and independent interpretation performed. Decision-making details documented in ED Course.  Risk OTC drugs. Prescription drug management. Parenteral controlled substances. Decision regarding hospitalization. Diagnosis or treatment significantly limited by social determinants of health.    81year old recent STEMI here for evaluation of left-sided chest pain which is constant in nature  however has pleuritic component.  Does not appear grossly fluid overloaded.  Some possible mild shortness of breath.  Previous provider spoke with cardiology, Dr. Percival Spanish who thought may be pericarditis based off of patient's EKG.  Plan on follow-up on labs, imaging and determine disposition   Labs and imaging personally viewed and interpreted:  CBC without leukocytosis Metabolic panel significant normality D-dimer elevated will order CTA Trop 28--26 EKG mild ST elevated CTA shows pericardial thickening versus pericardial effusion as well as loculated pleural effusion versus empyema as well as  possible edema versus infectious process  I discussed results with patient.  He denies any cough, shortness of breath however states he is still having some moderate chest pain to the left.  Will discuss with cardiology.  CONSULT with Cardiology Dr. Margaretann Loveless, recommends medicine admit, Toradol, colchicine, echocardiogram tomorrow, will see in consult  CONSULT with Dr. Velia Meyer with [facility] agreeable to evaluate patient for admission  The patient appears reasonably stabilized for admission considering the current resources, flow, and capabilities available in the ED at this time, and I doubt any other [facility] requiring further screening and/or treatment in the ED prior to admission.    Pericardial effusion Pericarditis Pleural effusion Empyema Pulmonary edema Elevated troponin     Trinten Boudoin A, PA-C 05/31/22 2028    Carmin Muskrat, MD 05/31/22 2348

## 2022-06-01 ENCOUNTER — Telehealth (HOSPITAL_COMMUNITY): Payer: Self-pay

## 2022-06-01 ENCOUNTER — Telehealth: Payer: Self-pay | Admitting: Cardiology

## 2022-06-01 ENCOUNTER — Observation Stay (HOSPITAL_BASED_OUTPATIENT_CLINIC_OR_DEPARTMENT_OTHER): Payer: Medicare HMO

## 2022-06-01 ENCOUNTER — Encounter (HOSPITAL_COMMUNITY): Payer: Medicare HMO

## 2022-06-01 DIAGNOSIS — I309 Acute pericarditis, unspecified: Secondary | ICD-10-CM

## 2022-06-01 DIAGNOSIS — I249 Acute ischemic heart disease, unspecified: Secondary | ICD-10-CM

## 2022-06-01 DIAGNOSIS — I10 Essential (primary) hypertension: Secondary | ICD-10-CM | POA: Diagnosis not present

## 2022-06-01 DIAGNOSIS — R0789 Other chest pain: Secondary | ICD-10-CM | POA: Diagnosis not present

## 2022-06-01 DIAGNOSIS — R079 Chest pain, unspecified: Secondary | ICD-10-CM | POA: Diagnosis not present

## 2022-06-01 LAB — COMPREHENSIVE METABOLIC PANEL
ALT: 18 U/L (ref 0–44)
AST: 25 U/L (ref 15–41)
Albumin: 3.3 g/dL — ABNORMAL LOW (ref 3.5–5.0)
Alkaline Phosphatase: 50 U/L (ref 38–126)
Anion gap: 11 (ref 5–15)
BUN: 12 mg/dL (ref 8–23)
CO2: 25 mmol/L (ref 22–32)
Calcium: 9.1 mg/dL (ref 8.9–10.3)
Chloride: 102 mmol/L (ref 98–111)
Creatinine, Ser: 0.89 mg/dL (ref 0.61–1.24)
GFR, Estimated: >60 mL/min (ref >60)
Glucose, Bld: 92 mg/dL (ref 70–99)
Potassium: 3.8 mmol/L (ref 3.5–5.1)
Sodium: 138 mmol/L (ref 135–145)
Total Bilirubin: 1 mg/dL (ref 0.3–1.2)
Total Protein: 6.6 g/dL (ref 6.5–8.1)

## 2022-06-01 LAB — CBC WITH DIFFERENTIAL/PLATELET
Abs Immature Granulocytes: 0.02 10*3/uL (ref 0.00–0.07)
Basophils Absolute: 0 10*3/uL (ref 0.0–0.1)
Basophils Relative: 0 %
Eosinophils Absolute: 0.1 10*3/uL (ref 0.0–0.5)
Eosinophils Relative: 2 %
HCT: 40.5 % (ref 39.0–52.0)
Hemoglobin: 13.6 g/dL (ref 13.0–17.0)
Immature Granulocytes: 0 %
Lymphocytes Relative: 22 %
Lymphs Abs: 1.5 10*3/uL (ref 0.7–4.0)
MCH: 28.3 pg (ref 26.0–34.0)
MCHC: 33.6 g/dL (ref 30.0–36.0)
MCV: 84.2 fL (ref 80.0–100.0)
Monocytes Absolute: 0.9 10*3/uL (ref 0.1–1.0)
Monocytes Relative: 14 %
Neutro Abs: 4.3 10*3/uL (ref 1.7–7.7)
Neutrophils Relative %: 62 %
Platelets: 226 10*3/uL (ref 150–400)
RBC: 4.81 MIL/uL (ref 4.22–5.81)
RDW: 14.7 % (ref 11.5–15.5)
WBC: 6.9 10*3/uL (ref 4.0–10.5)
nRBC: 0 % (ref 0.0–0.2)

## 2022-06-01 LAB — ECHOCARDIOGRAM COMPLETE
S' Lateral: 3.7 cm
Single Plane A4C EF: 58.5 %

## 2022-06-01 LAB — MAGNESIUM: Magnesium: 2.4 mg/dL (ref 1.7–2.4)

## 2022-06-01 LAB — BRAIN NATRIURETIC PEPTIDE: B Natriuretic Peptide: 110.1 pg/mL — ABNORMAL HIGH (ref 0.0–100.0)

## 2022-06-01 LAB — LIPASE, BLOOD: Lipase: 23 U/L (ref 11–51)

## 2022-06-01 MED ORDER — COLCHICINE 0.6 MG PO TABS
0.6000 mg | ORAL_TABLET | Freq: Two times a day (BID) | ORAL | 2 refills | Status: DC
Start: 2022-06-01 — End: 2022-09-10

## 2022-06-01 NOTE — Telephone Encounter (Signed)
Pt states that he was released from hospital yesterday and would like to know when he is able to start back going to Cardiac Rehab. Please advise

## 2022-06-01 NOTE — Telephone Encounter (Signed)
Pt called and stated that he was in the hospital and that he wouldn't be able to be at cardiac rehab. He also was asked to placed on hold until he received clearance to come back. I advised pt that if he is cleared at discharge then he will be able to return to cardiac rehab. Pt understood. I advised pt that I canceled his sessions for next week until we hear something back from him.

## 2022-06-01 NOTE — Progress Notes (Signed)
Progress Note  Patient Name: Miguel Walker Date of Encounter: 06/01/2022  [facility] Cardiologist: Minus Breeding, MD   Subjective   P/w pleuritic CP near the apex.  Otherwise no DOE, no LE edema. No orthopnea or PND. No exertional angina  Inpatient Medications    Scheduled Meds:  aspirin EC  81 mg Oral q AM   colchicine  0.6 mg Oral BID   dapagliflozin propanediol  10 mg Oral Daily   fentaNYL (SUBLIMAZE) injection  50 mcg Intravenous Once   ketorolac  15 mg Intravenous Once   rosuvastatin  40 mg Oral Daily   spironolactone  25 mg Oral Daily   ticagrelor  90 mg Oral BID   Continuous Infusions:  PRN Meds: acetaminophen **OR** acetaminophen   Vital Signs    Vitals:   06/01/22 0430 06/01/22 0500 06/01/22 0715 06/01/22 0723  BP: 103/70 111/68  113/65  Pulse: 87 88 87 87  Resp: '16 19 16 15  '$ Temp:    98 F (36.7 C)  TempSrc:    Oral  SpO2: 95% 95% 94% 94%   No intake or output data in the 24 hours ending 06/01/22 1031    05/15/2022    3:35 PM 05/15/2022    9:03 AM 04/10/2022    1:18 PM  Last 3 Weights  Weight (lbs) 187 lb 9.6 oz 187 lb 2.7 oz 183 lb  Weight (kg) 85.095 kg 84.9 kg 83.008 kg      Telemetry    NSR - Personally Reviewed  ECG    NA - Personally Reviewed  Physical Exam   Vitals:   06/01/22 0715 06/01/22 0723  BP:  113/65  Pulse: 87 87  Resp: 16 15  Temp:  98 F (36.7 C)  SpO2: 94% 94%    GEN: No acute distress.   Neck: No JVD Cardiac: RRR, no murmurs, rubs, or gallops.  Respiratory: Clear to auscultation bilaterally. GI: Soft, nontender, non-distended  MS: No edema; No deformity. Neuro:  Nonfocal  Psych: Normal affect   Labs    High Sensitivity Troponin:   Recent Labs  Lab 05/31/22 1220 05/31/22 1405  TROPONINIHS 28* 26*     Chemistry Recent Labs  Lab 05/31/22 1220 05/31/22 1944 06/01/22 0112  NA 137  --  138  K 4.7  --  3.8  CL 99  --  102  CO2 27  --  25  GLUCOSE 102*  --  92  BUN 13  --  12  CREATININE  1.06  --  0.89  CALCIUM 9.7  --  9.1  MG  --  2.4 2.4  PROT  --   --  6.6  ALBUMIN  --   --  3.3*  AST  --   --  25  ALT  --   --  18  ALKPHOS  --   --  50  BILITOT  --   --  1.0  GFRNONAA >60  --  >60  ANIONGAP 11  --  11    Lipids No results for input(s): "CHOL", "TRIG", "HDL", "LABVLDL", "LDLCALC", "CHOLHDL" in the last 168 hours.  Hematology Recent Labs  Lab 05/31/22 1220 06/01/22 0112  WBC 6.7 6.9  RBC 5.05 4.81  HGB 14.1 13.6  HCT 42.7 40.5  MCV 84.6 84.2  MCH 27.9 28.3  MCHC 33.0 33.6  RDW 14.8 14.7  PLT 257 226   Thyroid No results for input(s): "TSH", "FREET4" in the last 168 hours.  BNP Recent Labs  Lab 05/31/22 1944  BNP 110.1*    DDimer  Recent Labs  Lab 05/31/22 1555  DDIMER 2.58*     Radiology    ECHOCARDIOGRAM COMPLETE  Result Date: 06/01/2022    ECHOCARDIOGRAM REPORT   Patient Name:   VENCENT HAUSCHILD Date of Exam: 06/01/2022 Medical Rec #:  [id number]        Height:       70.0 in Accession #:    [id number]       Weight:       187.6 lb Date of Birth:  02-Aug-1941        BSA:          2.031 m Patient Age:    81 years         BP:           113/65 mmHg Patient Gender: M                HR:           89 bpm. Exam Location:  Inpatient Procedure: 2D Echo Indications:    chest pain  History:        Patient has prior history of Echocardiogram examinations, most                 recent 02/06/2022. CAD and thoracic aortic aneurysm; Risk                 Factors:Dyslipidemia.  Sonographer:    Johny Chess RDCS Referring Phys: [id number] [place]  1. Area of mid inferior septal, basal and mid inferior wall hypokinesis . Left ventricular ejection fraction, by estimation, is 50 to 55%. The left ventricle has low normal function. The left ventricle demonstrates regional wall motion abnormalities (see scoring diagram/findings for description). There is mild asymmetric left ventricular hypertrophy of the basal and septal segments. Left ventricular diastolic  parameters are indeterminate.  2. Right ventricular systolic function is normal. The right ventricular size is normal.  3. The mitral valve is normal in structure. No evidence of mitral valve regurgitation. No evidence of mitral stenosis.  4. The aortic valve is tricuspid. There is mild calcification of the aortic valve. Aortic valve regurgitation is not visualized. Aortic valve sclerosis is present, with no evidence of aortic valve stenosis.  5. The inferior vena cava is normal in size with greater than 50% respiratory variability, suggesting right atrial pressure of 3 mmHg. FINDINGS  Left Ventricle: Area of mid inferior septal, basal and mid inferior wall hypokinesis. Left ventricular ejection fraction, by estimation, is 50 to 55%. The left ventricle has low normal function. The left ventricle demonstrates regional wall motion abnormalities. The left ventricular internal cavity size was normal in size. There is mild asymmetric left ventricular hypertrophy of the basal and septal segments. Left ventricular diastolic parameters are indeterminate. Right Ventricle: The right ventricular size is normal. No increase in right ventricular wall thickness. Right ventricular systolic function is normal. Left Atrium: Left atrial size was normal in size. Right Atrium: Right atrial size was normal in size. Pericardium: There is no evidence of pericardial effusion. Mitral Valve: The mitral valve is normal in structure. No evidence of mitral valve regurgitation. No evidence of mitral valve stenosis. Tricuspid Valve: The tricuspid valve is normal in structure. Tricuspid valve regurgitation is trivial. No evidence of tricuspid stenosis. Aortic Valve: The aortic valve is tricuspid. There is mild calcification of the aortic valve. Aortic valve regurgitation is not visualized. Aortic valve sclerosis is present, with no  evidence of aortic valve stenosis. Pulmonic Valve: The pulmonic valve was normal in structure. Pulmonic valve  regurgitation is not visualized. No evidence of pulmonic stenosis. Aorta: The aortic root is normal in size and structure. Venous: The inferior vena cava is normal in size with greater than 50% respiratory variability, suggesting right atrial pressure of 3 mmHg. IAS/Shunts: No atrial level shunt detected by color flow Doppler.  LEFT VENTRICLE PLAX 2D LVIDd:         4.90 cm     Diastology LVIDs:         3.70 cm     LV e' medial:  9.03 cm/s LV PW:         1.00 cm     LV e' lateral: 8.27 cm/s LV IVS:        1.00 cm LVOT diam:     2.00 cm LV SV:         52 LV SV Index:   26 LVOT Area:     3.14 cm  LV Volumes (MOD) LV vol d, MOD A4C: 62.1 ml LV vol s, MOD A4C: 25.8 ml LV SV MOD A4C:     62.1 ml RIGHT VENTRICLE            IVC RV S prime:     9.03 cm/s  IVC diam: 1.60 cm TAPSE (M-mode): 1.7 cm LEFT ATRIUM             Index        RIGHT ATRIUM           Index LA diam:        3.30 cm 1.62 cm/m   RA Area:     15.50 cm LA Vol (A2C):   46.7 ml 22.99 ml/m  RA Volume:   41.30 ml  20.33 ml/m LA Vol (A4C):   33.8 ml 16.64 ml/m LA Biplane Vol: 40.6 ml 19.99 ml/m  AORTIC VALVE LVOT Vmax:   102.00 cm/s LVOT Vmean:  64.500 cm/s LVOT VTI:    0.165 m  AORTA Ao Root diam: 3.60 cm Ao Asc diam:  3.50 cm  SHUNTS Systemic VTI:  0.16 m Systemic Diam: 2.00 cm Jenkins Rouge MD Electronically signed by Jenkins Rouge MD Signature Date/Time: 06/01/2022/10:22:10 AM    Final    CT Angio Chest PE W/Cm &/Or Wo Cm  Result Date: 05/31/2022 CLINICAL DATA:  Elevated D-dimer.  Chest pain. EXAM: CT ANGIOGRAPHY CHEST WITH CONTRAST TECHNIQUE: Multidetector CT imaging of the chest was performed using the standard protocol during bolus administration of intravenous contrast. Multiplanar CT image reconstructions and MIPs were obtained to evaluate the vascular anatomy. RADIATION DOSE REDUCTION: This exam was performed according to the departmental dose-optimization program which includes automated exposure control, adjustment of the mA and/or kV according  to patient size and/or use of iterative reconstruction technique. CONTRAST:  49m OMNIPAQUE IOHEXOL 350 MG/ML SOLN COMPARISON:  September 29, 2021 FINDINGS: Cardiovascular: Satisfactory opacification of the pulmonary arteries to the segmental level. No evidence of pulmonary embolism. Normal heart size. Pericardial thickening versus pericardial effusion measuring up to 1 cm. Calcific atherosclerotic disease of the coronary arteries. Mediastinum/Nodes: No enlarged mediastinal, hilar, or axillary lymph nodes. Thyroid gland, trachea, and esophagus demonstrate no significant findings. Lungs/Pleura: Loculated appearing pleural effusion in the left lung base with enhancement of the pleura. Ground-glass opacities in the dependent portions of the lungs may represent pulmonary edema or infectious consolidation. Upper Abdomen: No acute abnormality. Musculoskeletal: No chest wall abnormality. No acute or significant osseous  findings. Review of the MIP images confirms the above findings. IMPRESSION: 1. No evidence of pulmonary embolism. 2. Pericardial thickening versus pericardial effusion measuring up to 1 cm. 3. Loculated appearing pleural effusion in the left lung base with enhancement of the pleura, which may represent empyema. 4. Ground-glass opacities in the dependent portions of the lungs may represent pulmonary edema or infectious consolidation. 5. Calcific atherosclerotic disease of the coronary arteries. 6. These results were called by telephone at the time of interpretation on 05/31/2022 at 6:36 pm to provider Dr. Vanita Panda, who verbally acknowledged these results. Electronically Signed   By: Fidela Salisbury M.D.   On: 05/31/2022 18:39   DG Chest 2 View  Result Date: 05/31/2022 CLINICAL DATA:  Chest pain EXAM: CHEST - 2 VIEW COMPARISON:  CT chest 09/29/2021 FINDINGS: Heart size and vascularity normal.  Negative for heart failure Mild airspace disease in the left lung base with small left effusion. Right lung  clear. IMPRESSION: Mild airspace disease left lung base with small left effusion. Possible pneumonia. Electronically Signed   By: Franchot Gallo M.D.   On: 05/31/2022 12:30    Cardiac Studies   TTE 06/01/2022 1. Area of mid inferior septal, basal and mid inferior wall hypokinesis .  Left ventricular ejection fraction, by estimation, is 50 to 55%. The left  ventricle has low normal function. The left ventricle demonstrates  regional wall motion abnormalities  (see scoring diagram/findings for description). There is mild asymmetric  left ventricular hypertrophy of the basal and septal segments. Left  ventricular diastolic parameters are indeterminate.   2. Right ventricular systolic function is normal. The right ventricular  size is normal.   3. The mitral valve is normal in structure. No evidence of mitral valve  regurgitation. No evidence of mitral stenosis.   4. The aortic valve is tricuspid. There is mild calcification of the  aortic valve. Aortic valve regurgitation is not visualized. Aortic valve  sclerosis is present, with no evidence of aortic valve stenosis.   5. The inferior vena cava is normal in size with greater than 50%  respiratory variability, suggesting right atrial pressure of 3 mmHg.   FINDINGS    LHC 02/05/2022  SUMMARY Severe heavily thrombotic 100% occlusion (TIMI 0 flow) of the proximal to distal RCA all the way to the distal bifurcation; Successful complex revascularization with extensive balloon angioplasty, aspiration thrombectomy and 3 overlapping DES stents placement from proximal to distal RCA restoring TIMI-3 flow ( Onyx Frontier 3.5 mm x 34 mm, 4.0 mm x 38 mm, 4.0 mm to 15 mm => postdilated in tapered fashion from 4.8 to 3.9 mm Reduced stenosis to 0% with TIMI-3 flow throughout the entire RCA system.  Only the mid vessel RV marginal Zakry Caso did not recanalize with revascularization. Distal Left Main eccentric calcified 45% stenosis, mid LAD 40% and mid LCx 50%  focal lesions. Preserved EF by LV gram with no obvious wall motion abnormality.  Mildly elevated LVEDP.  Patient Profile     Vartan Kerins is a 81 y.o. male with a hx of CAD, ischemic cardiomyopathy, hypertension, hyperlipidemia, complete heart block resolved now sinus with 1st degree AV block, and prostate cancer who is being seen 05/31/2022 for the evaluation of chest pain at the request of Dr. Vanita Panda.    Assessment & Plan    Pericarditis: has pleuritic CP. Has trivial effusion.  CRP was elevated 8.9. 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 Typically recommend high dose NSAID, a bit  hesitant while on DAPT. Will plan for colchicine and continue his asa with DAPT -continue colchicine 0.6 mg BID for 3 months  Ischemic Heart Disease: His EF is stable with old inferior infarct. S/p PCI 100% prox-distal RCA lesion. His story is not c/w ACS - continue DAPT - continue crestor - continue farxiga - we discussed that if he is not feeling well, he should not attend cardiac rehab until his symptoms have improved  HTN: continue spironolactone 25 mg daily  Hx CHB: resolved. Conducting well  Patient can be discharged today. We will arrange FU  For questions or updates, please contact [place] Please consult [email or web address] for contact info under        Signed, Janina Mayo, MD  06/01/2022, 10:31 AM

## 2022-06-01 NOTE — Discharge Summary (Signed)
Physician Discharge Summary  [name] NIO:[id number] DOB: Nov 18, 1941 DOA: 05/31/2022  PCP: London Pepper, MD  Admit date: 05/31/2022 Discharge date: 06/01/2022  Admitted From: Home Disposition:  Home  Discharge Condition:Stable CODE STATUS:FULL Diet recommendation: Heart Healthy     Brief/Interim Summary:  Miguel Walker is a 81 y.o. male with medical history significant for coronary artery disease status post STEMI in March 2023, hypertension, hyperlipidemia, chronic systolic/diastolic heart failure, ascending thoracic aortic aneurysm, who is admitted to [facility] on 05/31/2022 with atypical chest pain after presenting from home to [facility] ED complaining of chest pain.the patient experienced a STEMI in March 2023, at which time he underwent left-sided coronary angiography on 02/05/2022, receiving PCI with 3 overlapping drug-eluting stents to the RCA at that time. On presentation, he was hemodynamically stable.  Troponin mildly elevated with flat trend.  Found to have elevated CRP.  EKG did not show any acute finding suggestive of ischemia.  CTA chest did not show any PE but showed evidence of pericardial thickening versus pericardial effusion, small left pleural effusion with potential loculation.  He had low suspicion for pneumonia so antibiotics not started.  Cardiology consulted.  Patient underwent echocardiogram which showed ejection fraction of 50 to 55%, some regional wall motion abnormality.  Patient does not complain of any chest pain this morning.  Started on treatment for pericarditis with colchicine.  Cardiology cleared for discharge.  He will follow-up with his cardiologist as an outpatient, outpatient follow-up arrangement will be done by cardiology. I called his wife and updated about the discharge plan.  Discharge Diagnoses:  Principal Problem:   Atypical chest pain Active Problems:   Hyperlipidemia LDL goal <70   History of CAD (coronary artery disease)   Chronic  combined systolic and diastolic heart failure [facility])    Discharge Instructions  Discharge Instructions     Diet - low sodium heart healthy   Complete by: As directed    Discharge instructions   Complete by: As directed    1)Please take prescribed medication as instructed 2)Your cardiologist will arrange out patient follow up   Increase activity slowly   Complete by: As directed       Allergies as of 06/01/2022   No Known Allergies      Medication List     TAKE these medications    ascorbic acid 500 MG tablet Commonly known as: VITAMIN C Take 500 mg by mouth in the morning.   aspirin EC 81 MG tablet Take 81 mg by mouth in the morning.   Brilinta 90 MG Tabs tablet Generic drug: ticagrelor TAKE 1 TABLET BY MOUTH TWICE  DAILY What changed: how much to take   colchicine 0.6 MG tablet Take 1 tablet (0.6 mg total) by mouth 2 (two) times daily.   Farxiga 10 MG Tabs tablet Generic drug: dapagliflozin propanediol TAKE 1 TABLET BY MOUTH DAILY What changed: how much to take   hydrocortisone 2.5 % cream Apply 1 application  topically 2 (two) times daily as needed (irritation.).   metroNIDAZOLE 0.75 % cream Commonly known as: METROCREAM Apply 1 application  topically 2 (two) times daily as needed (skin rash).   Multi-Vitamin tablet Take 1 tablet by mouth in the morning. Centrum Silver   Myrbetriq 25 MG Tb24 tablet Generic drug: mirabegron ER Take 25 mg by mouth in the morning.   nitroGLYCERIN 0.4 MG SL tablet Commonly known as: Nitrostat Place 1 tablet (0.4 mg total) under the tongue every 5 (five) minutes as needed for  chest pain.   Omega-3 1000 MG Caps Take 1,000 mg by mouth in the morning.   rosuvastatin 40 MG tablet Commonly known as: CRESTOR Take 1 tablet (40 mg total) by mouth daily.   spironolactone 25 MG tablet Commonly known as: ALDACTONE Take 1 tablet (25 mg total) by mouth daily.   tolterodine 4 MG 24 hr capsule Commonly known as: DETROL  LA Take 4 mg by mouth in the morning.   triamcinolone 0.025 % cream Commonly known as: KENALOG Apply 1 application  topically 2 (two) times daily as needed (skin irritation).        Follow-up Information     London Pepper, MD. Schedule an appointment as soon as possible for a visit in 1 week(s).   Specialty: Family Medicine Contact information: [place] [address] [phone]         Minus Breeding, MD .   Specialty: Cardiology Contact information: [address] [place] [address] [phone]                No Known Allergies  Consultations: Cardiology   Procedures/Studies: ECHOCARDIOGRAM COMPLETE  Result Date: 06/01/2022    ECHOCARDIOGRAM REPORT   Patient Name:   Miguel Walker Date of Exam: 06/01/2022 Medical Rec #:  [id number]        Height:       70.0 in Accession #:    [id number]       Weight:       187.6 lb Date of Birth:  [birth year]        BSA:          2.031 m Patient Age:    5 years         BP:           113/65 mmHg Patient Gender: M                HR:           89 bpm. Exam Location:  Inpatient Procedure: 2D Echo Indications:    chest pain  History:        Patient has prior history of Echocardiogram examinations, most                 recent 02/06/2022. CAD and thoracic aortic aneurysm; Risk                 Factors:Dyslipidemia.  Sonographer:    Johny Chess RDCS Referring Phys: [id number] [place]  1. Area of mid inferior septal, basal and mid inferior wall hypokinesis . Left ventricular ejection fraction, by estimation, is 50 to 55%. The left ventricle has low normal function. The left ventricle demonstrates regional wall motion abnormalities (see scoring diagram/findings for description). There is mild asymmetric left ventricular hypertrophy of the basal and septal segments. Left ventricular diastolic parameters are indeterminate.  2. Right ventricular systolic function is normal.  The right ventricular size is normal.  3. The mitral valve is normal in structure. No evidence of mitral valve regurgitation. No evidence of mitral stenosis.  4. The aortic valve is tricuspid. There is mild calcification of the aortic valve. Aortic valve regurgitation is not visualized. Aortic valve sclerosis is present, with no evidence of aortic valve stenosis.  5. The inferior vena cava is normal in size with greater than 50% respiratory variability, suggesting right atrial pressure of 3 mmHg. FINDINGS  Left Ventricle: Area of mid inferior septal, basal and mid inferior wall hypokinesis. Left  ventricular ejection fraction, by estimation, is 50 to 55%. The left ventricle has low normal function. The left ventricle demonstrates regional wall motion abnormalities. The left ventricular internal cavity size was normal in size. There is mild asymmetric left ventricular hypertrophy of the basal and septal segments. Left ventricular diastolic parameters are indeterminate. Right Ventricle: The right ventricular size is normal. No increase in right ventricular wall thickness. Right ventricular systolic function is normal. Left Atrium: Left atrial size was normal in size. Right Atrium: Right atrial size was normal in size. Pericardium: There is no evidence of pericardial effusion. Mitral Valve: The mitral valve is normal in structure. No evidence of mitral valve regurgitation. No evidence of mitral valve stenosis. Tricuspid Valve: The tricuspid valve is normal in structure. Tricuspid valve regurgitation is trivial. No evidence of tricuspid stenosis. Aortic Valve: The aortic valve is tricuspid. There is mild calcification of the aortic valve. Aortic valve regurgitation is not visualized. Aortic valve sclerosis is present, with no evidence of aortic valve stenosis. Pulmonic Valve: The pulmonic valve was normal in structure. Pulmonic valve regurgitation is not visualized. No evidence of pulmonic stenosis. Aorta: The aortic  root is normal in size and structure. Venous: The inferior vena cava is normal in size with greater than 50% respiratory variability, suggesting right atrial pressure of 3 mmHg. IAS/Shunts: No atrial level shunt detected by color flow Doppler.  LEFT VENTRICLE PLAX 2D LVIDd:         4.90 cm     Diastology LVIDs:         3.70 cm     LV e' medial:  9.03 cm/s LV PW:         1.00 cm     LV e' lateral: 8.27 cm/s LV IVS:        1.00 cm LVOT diam:     2.00 cm LV SV:         52 LV SV Index:   26 LVOT Area:     3.14 cm  LV Volumes (MOD) LV vol d, MOD A4C: 62.1 ml LV vol s, MOD A4C: 25.8 ml LV SV MOD A4C:     62.1 ml RIGHT VENTRICLE            IVC RV S prime:     9.03 cm/s  IVC diam: 1.60 cm TAPSE (M-mode): 1.7 cm LEFT ATRIUM             Index        RIGHT ATRIUM           Index LA diam:        3.30 cm 1.62 cm/m   RA Area:     15.50 cm LA Vol (A2C):   46.7 ml 22.99 ml/m  RA Volume:   41.30 ml  20.33 ml/m LA Vol (A4C):   33.8 ml 16.64 ml/m LA Biplane Vol: 40.6 ml 19.99 ml/m  AORTIC VALVE LVOT Vmax:   102.00 cm/s LVOT Vmean:  64.500 cm/s LVOT VTI:    0.165 m  AORTA Ao Root diam: 3.60 cm Ao Asc diam:  3.50 cm  SHUNTS Systemic VTI:  0.16 m Systemic Diam: 2.00 cm Jenkins Rouge MD Electronically signed by Jenkins Rouge MD Signature Date/Time: 06/01/2022/10:22:10 AM    Final    CT Angio Chest PE W/Cm &/Or Wo Cm  Result Date: 05/31/2022 CLINICAL DATA:  Elevated D-dimer.  Chest pain. EXAM: CT ANGIOGRAPHY CHEST WITH CONTRAST TECHNIQUE: Multidetector CT imaging of the chest was performed using the standard protocol  during bolus administration of intravenous contrast. Multiplanar CT image reconstructions and MIPs were obtained to evaluate the vascular anatomy. RADIATION DOSE REDUCTION: This exam was performed according to the departmental dose-optimization program which includes automated exposure control, adjustment of the mA and/or kV according to patient size and/or use of iterative reconstruction technique. CONTRAST:  84m  OMNIPAQUE IOHEXOL 350 MG/ML SOLN COMPARISON:  September 29, 2021 FINDINGS: Cardiovascular: Satisfactory opacification of the pulmonary arteries to the segmental level. No evidence of pulmonary embolism. Normal heart size. Pericardial thickening versus pericardial effusion measuring up to 1 cm. Calcific atherosclerotic disease of the coronary arteries. Mediastinum/Nodes: No enlarged mediastinal, hilar, or axillary lymph nodes. Thyroid gland, trachea, and esophagus demonstrate no significant findings. Lungs/Pleura: Loculated appearing pleural effusion in the left lung base with enhancement of the pleura. Ground-glass opacities in the dependent portions of the lungs may represent pulmonary edema or infectious consolidation. Upper Abdomen: No acute abnormality. Musculoskeletal: No chest wall abnormality. No acute or significant osseous findings. Review of the MIP images confirms the above findings. IMPRESSION: 1. No evidence of pulmonary embolism. 2. Pericardial thickening versus pericardial effusion measuring up to 1 cm. 3. Loculated appearing pleural effusion in the left lung base with enhancement of the pleura, which may represent empyema. 4. Ground-glass opacities in the dependent portions of the lungs may represent pulmonary edema or infectious consolidation. 5. Calcific atherosclerotic disease of the coronary arteries. 6. These results were called by telephone at the time of interpretation on 05/31/2022 at 6:36 pm to provider Dr. LVanita Panda who verbally acknowledged these results. Electronically Signed   By: DFidela SalisburyM.D.   On: 05/31/2022 18:39   DG Chest 2 View  Result Date: 05/31/2022 CLINICAL DATA:  Chest pain EXAM: CHEST - 2 VIEW COMPARISON:  CT chest 09/29/2021 FINDINGS: Heart size and vascularity normal.  Negative for heart failure Mild airspace disease in the left lung base with small left effusion. Right lung clear. IMPRESSION: Mild airspace disease left lung base with small left effusion.  Possible pneumonia. Electronically Signed   By: CFranchot GalloM.D.   On: 05/31/2022 12:30      Subjective: Patient seen and examined at the bedside this morning.  Hemodynamically stable for discharge today.  Discharge Exam: Vitals:   06/01/22 0715 06/01/22 0723  BP:  113/65  Pulse: 87 87  Resp: 16 15  Temp:  98 F (36.7 C)  SpO2: 94% 94%   Vitals:   06/01/22 0430 06/01/22 0500 06/01/22 0715 06/01/22 0723  BP: 103/70 111/68  113/65  Pulse: 87 88 87 87  Resp: '16 19 16 15  '$ Temp:    98 F (36.7 C)  TempSrc:    Oral  SpO2: 95% 95% 94% 94%    General: Pt is alert, awake, not in acute distress Cardiovascular: RRR, S1/S2 +, no rubs, no gallops Respiratory: CTA bilaterally, no wheezing, no rhonchi Abdominal: Soft, NT, ND, bowel sounds + Extremities: no edema, no cyanosis    The results of significant diagnostics from this hospitalization (including imaging, microbiology, ancillary and laboratory) are listed below for reference.     Microbiology: No results found for this or any previous visit (from the past 240 hour(s)).   Labs: BNP (last 3 results) Recent Labs    05/31/22 1944  BNP 1774.1   Basic Metabolic Panel: Recent Labs  Lab 05/31/22 1220 05/31/22 1944 06/01/22 0112  NA 137  --  138  K 4.7  --  3.8  CL 99  --  102  CO2  27  --  25  GLUCOSE 102*  --  92  BUN 13  --  12  CREATININE 1.06  --  0.89  CALCIUM 9.7  --  9.1  MG  --  2.4 2.4   Liver Function Tests: Recent Labs  Lab 06/01/22 0112  AST 25  ALT 18  ALKPHOS 50  BILITOT 1.0  PROT 6.6  ALBUMIN 3.3*   Recent Labs  Lab 06/01/22 0112  LIPASE 23   No results for input(s): "AMMONIA" in the last 168 hours. CBC: Recent Labs  Lab 05/31/22 1220 06/01/22 0112  WBC 6.7 6.9  NEUTROABS  --  4.3  HGB 14.1 13.6  HCT 42.7 40.5  MCV 84.6 84.2  PLT 257 226   Cardiac Enzymes: No results for input(s): "CKTOTAL", "CKMB", "CKMBINDEX", "TROPONINI" in the last 168 hours. BNP: Invalid input(s):  "POCBNP" CBG: No results for input(s): "GLUCAP" in the last 168 hours. D-Dimer Recent Labs    05/31/22 1555  DDIMER 2.58*   Hgb A1c No results for input(s): "HGBA1C" in the last 72 hours. Lipid Profile No results for input(s): "CHOL", "HDL", "LDLCALC", "TRIG", "CHOLHDL", "LDLDIRECT" in the last 72 hours. Thyroid function studies No results for input(s): "TSH", "T4TOTAL", "T3FREE", "THYROIDAB" in the last 72 hours.  Invalid input(s): "FREET3" Anemia work up No results for input(s): "VITAMINB12", "FOLATE", "FERRITIN", "TIBC", "IRON", "RETICCTPCT" in the last 72 hours. Urinalysis No results found for: "COLORURINE", "APPEARANCEUR", "LABSPEC", "PHURINE", "GLUCOSEU", "HGBUR", "BILIRUBINUR", "KETONESUR", "PROTEINUR", "UROBILINOGEN", "NITRITE", "LEUKOCYTESUR" Sepsis Labs Recent Labs  Lab 05/31/22 1220 06/01/22 0112  WBC 6.7 6.9   Microbiology No results found for this or any previous visit (from the past 240 hour(s)).  Please note: You were cared for by a hospitalist during your hospital stay. Once you are discharged, your primary care physician will handle any further medical issues. Please note that NO REFILLS for any discharge medications will be authorized once you are discharged, as it is imperative that you return to your primary care physician (or establish a relationship with a primary care physician if you do not have one) for your post hospital discharge needs so that they can reassess your need for medications and monitor your lab values.    Time coordinating discharge: 40 minutes  SIGNED:   Shelly Coss, MD  [facility] 06/01/2022, 10:49 AM Pager [phone]  If 7PM-7AM, please contact night-coverage [email or web address] Password [email or web address]

## 2022-06-01 NOTE — Telephone Encounter (Signed)
Left message for patient . Will defer to Dr Percival Spanish - will call patient back with a response.  Patient recently went to ER on 05/31/22.   Patient has a follow appointment with J.Lambert PA on 726/23.

## 2022-06-01 NOTE — ED Notes (Signed)
Pt refusing Covid Testing at this time.

## 2022-06-01 NOTE — Progress Notes (Signed)
  Echocardiogram 2D Echocardiogram has been performed.  Miguel Walker 06/01/2022, 10:07 AM

## 2022-06-04 ENCOUNTER — Encounter: Payer: Self-pay | Admitting: Cardiology

## 2022-06-04 ENCOUNTER — Encounter (HOSPITAL_COMMUNITY): Payer: Medicare HMO

## 2022-06-06 ENCOUNTER — Encounter (HOSPITAL_COMMUNITY): Payer: Medicare HMO

## 2022-06-06 NOTE — Telephone Encounter (Signed)
  He could restart when his pain resolves.     Minus Breeding, MD

## 2022-06-06 NOTE — Telephone Encounter (Signed)
Left message on patient voicemail Dr Percival Spanish  comment. Any question may call back

## 2022-06-08 ENCOUNTER — Encounter (HOSPITAL_COMMUNITY): Payer: Medicare HMO

## 2022-06-08 ENCOUNTER — Encounter (HOSPITAL_COMMUNITY)
Admission: RE | Admit: 2022-06-08 | Discharge: 2022-06-08 | Disposition: A | Discharge status: Home / Self Care | Payer: Medicare HMO | Source: Ambulatory Visit | Attending: Cardiology | Admitting: Cardiology

## 2022-06-08 DIAGNOSIS — I213 ST elevation (STEMI) myocardial infarction of unspecified site: Secondary | ICD-10-CM

## 2022-06-08 DIAGNOSIS — Z955 Presence of coronary angioplasty implant and graft: Secondary | ICD-10-CM | POA: Insufficient documentation

## 2022-06-08 DIAGNOSIS — I252 Old myocardial infarction: Secondary | ICD-10-CM | POA: Diagnosis not present

## 2022-06-08 DIAGNOSIS — Z48812 Encounter for surgical aftercare following surgery on the circulatory system: Secondary | ICD-10-CM | POA: Insufficient documentation

## 2022-06-11 ENCOUNTER — Encounter (HOSPITAL_COMMUNITY)
Admission: RE | Admit: 2022-06-11 | Discharge: 2022-06-11 | Disposition: A | Discharge status: Home / Self Care | Payer: Medicare HMO | Source: Ambulatory Visit | Attending: Cardiology | Admitting: Cardiology

## 2022-06-11 ENCOUNTER — Encounter (HOSPITAL_COMMUNITY): Payer: Medicare HMO

## 2022-06-11 DIAGNOSIS — I213 ST elevation (STEMI) myocardial infarction of unspecified site: Secondary | ICD-10-CM

## 2022-06-11 DIAGNOSIS — Z955 Presence of coronary angioplasty implant and graft: Secondary | ICD-10-CM

## 2022-06-11 NOTE — Progress Notes (Signed)
CARDIAC REHAB PHASE 2  Reviewed home exercise with pt today. Pt is tolerating exercise well. Pt will continue to exercise on his own by walking and golf for 30+ minutes per session 3 days a week in addition to the 3 days in CRP2. Advised pt on THRR, RPE scale, hydration and temperature/humidity precautions. Reinforced NTG use, S/S to stop exercise and when to call MD vs 911. Encouraged warm up cool down and stretches with exercise sessions. Pt verbalized understanding, all questions were answered and pt was given a copy to take home.    Kirby Funk ACSM-CEP 06/11/2022 11:54 AM

## 2022-06-13 ENCOUNTER — Encounter (HOSPITAL_COMMUNITY)
Admission: RE | Admit: 2022-06-13 | Discharge: 2022-06-13 | Disposition: A | Discharge status: Home / Self Care | Payer: Medicare HMO | Source: Ambulatory Visit | Attending: Cardiology | Admitting: Cardiology

## 2022-06-13 DIAGNOSIS — I213 ST elevation (STEMI) myocardial infarction of unspecified site: Secondary | ICD-10-CM

## 2022-06-13 DIAGNOSIS — Z955 Presence of coronary angioplasty implant and graft: Secondary | ICD-10-CM

## 2022-06-15 ENCOUNTER — Encounter (HOSPITAL_COMMUNITY): Payer: Medicare HMO

## 2022-06-18 ENCOUNTER — Encounter (HOSPITAL_COMMUNITY): Payer: Medicare HMO

## 2022-06-20 ENCOUNTER — Encounter (HOSPITAL_COMMUNITY): Payer: Medicare HMO

## 2022-06-22 ENCOUNTER — Encounter (HOSPITAL_COMMUNITY): Payer: Medicare HMO

## 2022-06-25 ENCOUNTER — Encounter (HOSPITAL_COMMUNITY)
Admission: RE | Admit: 2022-06-25 | Discharge: 2022-06-25 | Disposition: A | Discharge status: Home / Self Care | Payer: Medicare HMO | Source: Ambulatory Visit | Attending: Cardiology | Admitting: Cardiology

## 2022-06-25 DIAGNOSIS — I213 ST elevation (STEMI) myocardial infarction of unspecified site: Secondary | ICD-10-CM

## 2022-06-25 DIAGNOSIS — Z955 Presence of coronary angioplasty implant and graft: Secondary | ICD-10-CM | POA: Diagnosis not present

## 2022-06-26 NOTE — Progress Notes (Unsigned)
Cardiology Office Note:    Date:  06/27/2022   ID:  Miguel Walker, DOB May 07, 1941, MRN [id number]  PCP:  London Pepper, MD [place] Cardiologist: Minus Breeding, MD   Reason for visit: Hospital follow-up  History of Present Illness:    Miguel Walker is a 81 y.o. male with a hx of CAD - STEMI in March 2023 s/p DES to RCA, ischemic cardiomyopathy, abnormal ABIs, hypertension, hyperlipidemia, dilation of ascending aorta.  He last had Dr. Percival Spanish May 15, 2022.  He was walking and golfing without angina or heart failure symptoms.  He was admitted to the hospital June 29 through June 30 with atypical chest pain.  Troponin mildly elevated with flat trend.  Elevated CRP.  CTA chest with no PE though evidence of pericardial thickening versus pericardial effusion.  Echo EF 50 to 55%.  Started on colchicine for possible pericarditis.  He has restarted cardiac rehab.  He brings his cardiac rehab report -his most recent level was 3.5 METS.  Blood pressure running average 100s to 110s over 60s to 70s at rest.  Blood pressure occasionally high 90s over 60s.  He denies lightheadedness.  Heart rate averaging 80s at rest.  Today, he states when he went to the ED, he felt bilateral arm numbness and chest tightness.  This resolved pretty quickly after starting colchicine for pericarditis.  He denies GI upset with colchicine.  Overall he is doing well post MI in March.  He denies shortness of breath and chest pain.  He is 21 pounds lighter with very healthy diet.  He has enjoyed cardiac rehab.  He is trying to increase his upper body strength.  He recently had 81th birthday and has several trips planned.  He states he is finally retiring completely; he has a Publishing rights manager in education.    Past Medical History:  Diagnosis Date   Aneurysm, ascending aorta ([place])    Cancer ([place]) 1997   Prostate cancer   Coronary artery disease    Dyslipidemia    Family history of premature CAD    NO ISCHEMIC  SYMPTOMS   Hyperlipidemia    Hypertension    Thyroid nodule     Past Surgical History:  Procedure Laterality Date   CARDIAC CATHETERIZATION     CARPAL TUNNEL RELEASE     CORONARY/GRAFT ACUTE MI REVASCULARIZATION N/A 02/05/2022   Procedure: Coronary/Graft Acute MI Revascularization;  Surgeon: Leonie Man, MD;  Location: [facility];  Service: Cardiovascular;  Laterality: N/A;   HAND SURGERY     LEFT HEART CATH AND CORONARY ANGIOGRAPHY N/A 02/05/2022   Procedure: LEFT HEART CATH AND CORONARY ANGIOGRAPHY;  Surgeon: Leonie Man, MD;  Location: [facility];  Service: Cardiovascular;  Laterality: N/A;   ROTATOR CUFF REPAIR     URINARY SPHINCTER IMPLANT      Current Medications: Current Meds  Medication Sig   ascorbic acid (VITAMIN C) 500 MG tablet Take 500 mg by mouth in the morning.   aspirin EC 81 MG tablet Take 81 mg by mouth in the morning.   BRILINTA 90 MG TABS tablet TAKE 1 TABLET BY MOUTH TWICE  DAILY (Patient taking differently: Take 90 mg by mouth 2 (two) times daily.)   colchicine 0.6 MG tablet Take 1 tablet (0.6 mg total) by mouth 2 (two) times daily.   FARXIGA 10 MG TABS tablet TAKE 1 TABLET BY MOUTH DAILY (Patient taking differently: Take 10 mg by mouth daily.)   hydrocortisone 2.5 %  cream Apply 1 application  topically 2 (two) times daily as needed (irritation.).   metroNIDAZOLE (METROCREAM) 0.75 % cream Apply 1 application  topically 2 (two) times daily as needed (skin rash).   Multiple Vitamin (MULTI-VITAMIN) tablet Take 1 tablet by mouth in the morning. Centrum Silver   MYRBETRIQ 25 MG TB24 tablet Take 25 mg by mouth in the morning.   nitroGLYCERIN (NITROSTAT) 0.4 MG SL tablet Place 1 tablet (0.4 mg total) under the tongue every 5 (five) minutes as needed for chest pain.   Omega-3 1000 MG CAPS Take 1,000 mg by mouth in the morning.   spironolactone (ALDACTONE) 25 MG tablet Take 1 tablet (25 mg total) by mouth daily.   tolterodine (DETROL LA) 4 MG  24 hr capsule Take 4 mg by mouth in the morning.   triamcinolone (KENALOG) 0.025 % cream Apply 1 application  topically 2 (two) times daily as needed (skin irritation).     Allergies:   Patient has no known allergies.   Social History   Socioeconomic History   Marital status: Married    Spouse name: Not on file   Number of children: 2   Years of education: 65   Highest education level: Master's degree (e.g., MA, MS, MEng, MEd, MSW, MBA)  Occupational History   Occupation: Retired Tourist information centre manager  Tobacco Use   Smoking status: Never   Smokeless tobacco: Never  Vaping Use   Vaping Use: Never used  Substance and Sexual Activity   Alcohol use: Yes    Alcohol/week: 3.0 standard drinks of alcohol    Types: 3 Glasses of wine per week    Comment: 3 glasses of red wine per week   Drug use: Never   Sexual activity: Not on file  Other Topics Concern   Not on file  Social History Narrative   Retired.  Mrried    Social Determinants of Radio broadcast assistant Strain: Not on file  Food Insecurity: Not on file  Transportation Needs: Not on file  Physical Activity: Not on file  Stress: Not on file  Social Connections: Not on file     Family History: The patient's family history includes CAD in his father.  ROS:   Please see the history of present illness.     EKGs/Labs/Other Studies Reviewed:    EKG:  The ekg ordered today demonstrates normal sinus rhythm with first-degree AV block, heart rate 78.  Inverted T waves inferiorly -unchanged from previous EKG.  Recent Labs: 05/31/2022: B Natriuretic Peptide 110.1 06/01/2022: ALT 18; BUN 12; Creatinine, Ser 0.89; Hemoglobin 13.6; Magnesium 2.4; Platelets 226; Potassium 3.8; Sodium 138   Recent Lipid Panel Lab Results  Component Value Date/Time   CHOL 133 05/11/2022 08:42 AM   TRIG 134 05/11/2022 08:42 AM   HDL 50 05/11/2022 08:42 AM   LDLCALC 60 05/11/2022 08:42 AM    Physical Exam:    VS:  BP 110/70 (BP Location: Left Arm,  Patient Position: Sitting, Cuff Size: Normal)   Pulse 78   Resp 20   Ht '5\' 10"'$  (1.778 m)   Wt 183 lb 12.8 oz (83.4 kg)   SpO2 98%   BMI 26.37 kg/m    No data found.       Wt Readings from Last 3 Encounters:  06/27/22 183 lb 12.8 oz (83.4 kg)  05/15/22 187 lb 2.7 oz (84.9 kg)  05/15/22 187 lb 9.6 oz (85.1 kg)     GEN:  Well nourished, well developed in no acute  distress HEENT: Normal NECK: No JVD; No carotid bruits CARDIAC: RRR, no murmurs, rubs, gallops RESPIRATORY:  Clear to auscultation without rales, wheezing or rhonchi  ABDOMEN: Soft, non-tender, non-distended MUSCULOSKELETAL: No edema; No deformity  SKIN: Warm and dry NEUROLOGIC:  Alert and oriented PSYCHIATRIC:  Normal affect  VASCULAR: Strong pedal pulses   ASSESSMENT AND PLAN   Pericarditis, symptoms resolved -Presented with pleuritic chest pain June 2023 with elevated CRP, trivial pericardial effusion. -Continue colchicine 0.6 mg twice daily for 3 months.  Coronary artery disease, no angina -History of MI 01/2022 status post DES to RCA -Continue DAPT for at least 1 year post MI -Continue Crestor and [place].  Ischemic cardiomyopathy with improved EF -Echo March 2023 with EF of 40 to 45% during admission for STEMI -Repeat echo June 2023 with EF 50-55% -Improved EF and borderline blood pressure, will defer on titrating heart failure meds. -Continue Farxiga and spironolactone; kidney function potassium normal June 06, 2022. -If his systolic blood pressure is recurrently less than 95, would recommend decreasing spironolactone 12.5 mg daily and call our office.  Abnormal ABIs -R foot 0.77, left 0.57 -He previously had leg soreness which he thinks was due to deconditioning -this has resolved. -Denies claudication & ulcers -Strong pedal pulses -Repeat ABIs scheduled for September  Dilation of ascending aorta -Stable aortic root diameter of 3.9 cm at the sinus of Valsalva on CT 09/2021  Hyperlipidemia with  goal LDL less than 70 -LDL 60 in May 11, 2022. -Continue Crestor 40 mg daily. -Discussed cholesterol lowering diets - Mediterranean diet, DASH diet, vegetarian diet, low-carbohydrate diet and avoidance of trans fats.  Discussed healthier choice substitutes.  Nuts, high-fiber foods, and fiber supplements may also improve lipids.    Disposition - Follow-up in September as scheduled Dr. Percival Spanish.   Medication Adjustments/Labs and Tests Ordered: Current medicines are reviewed at length with the patient today.  Concerns regarding medicines are outlined above.  Orders Placed This Encounter  Procedures   EKG 12-Lead   No orders of the defined types were placed in this encounter.   Patient Instructions  Medication Instructions:  No Changes *If you need a refill on your cardiac medications before your next appointment, please call your pharmacy*   Lab Work: No Labs If you have labs (blood work) drawn today and your tests are completely normal, you will receive your results only by: [facility] (if you have MyChart) OR A paper copy in the mail If you have any lab test that is abnormal or we need to change your treatment, we will call you to review the results.   Testing/Procedures: No Testing   Follow-Up: At [facility], you and your health needs are our priority.  As part of our continuing mission to provide you with exceptional heart care, we have created designated Provider Care Teams.  These Care Teams include your primary Cardiologist (physician) and Advanced Practice Providers (APPs -  Physician Assistants and Nurse Practitioners) who all work together to provide you with the care you need, when you need it.  We recommend signing up for the patient portal called "MyChart".  Sign up information is provided on this After Visit Summary.  MyChart is used to connect with patients for Virtual Visits (Telemedicine).  Patients are able to view lab/test results, encounter notes,  upcoming appointments, etc.  Non-urgent messages can be sent to your provider as well.   To learn more about what you can do with MyChart, go to [email or web address].    Your  next appointment:   Keep Scheduled Appointment  The format for your next appointment:   In Person  Provider:   Minus Breeding, MD     Other Instructions Continue colchicine  for 3 months. If Blood Pressure less than 95 systolic consistently reduec Spirolactone to Half Tablet and Call office.   Important Information About Sugar         Signed, Warren Lacy, PA-C  06/27/2022 10:14 AM    [place]

## 2022-06-27 ENCOUNTER — Encounter: Payer: Self-pay | Admitting: Physician Assistant

## 2022-06-27 ENCOUNTER — Ambulatory Visit: Payer: Medicare HMO | Admitting: Physician Assistant

## 2022-06-27 ENCOUNTER — Encounter (HOSPITAL_COMMUNITY)
Admission: RE | Admit: 2022-06-27 | Discharge: 2022-06-27 | Disposition: A | Discharge status: Home / Self Care | Payer: Medicare HMO | Source: Ambulatory Visit | Attending: Cardiology | Admitting: Cardiology

## 2022-06-27 VITALS — BP 110/70 | HR 78 | Resp 20 | Ht 70.0 in | Wt 183.8 lb

## 2022-06-27 DIAGNOSIS — I255 Ischemic cardiomyopathy: Secondary | ICD-10-CM | POA: Diagnosis not present

## 2022-06-27 DIAGNOSIS — I25110 Atherosclerotic heart disease of native coronary artery with unstable angina pectoris: Secondary | ICD-10-CM | POA: Diagnosis not present

## 2022-06-27 DIAGNOSIS — I319 Disease of pericardium, unspecified: Secondary | ICD-10-CM | POA: Diagnosis not present

## 2022-06-27 DIAGNOSIS — I238 Other current complications following acute myocardial infarction: Secondary | ICD-10-CM | POA: Diagnosis not present

## 2022-06-27 DIAGNOSIS — Z955 Presence of coronary angioplasty implant and graft: Secondary | ICD-10-CM | POA: Diagnosis not present

## 2022-06-27 DIAGNOSIS — I213 ST elevation (STEMI) myocardial infarction of unspecified site: Secondary | ICD-10-CM

## 2022-06-27 NOTE — Progress Notes (Signed)
Cardiac Individual Treatment Plan  Patient Details  Name: Miguel Walker MRN: [id number] Date of Birth: 1941/05/26 Referring Provider:   Flowsheet Row INTENSIVE CARDIAC REHAB ORIENT from 05/15/2022 in [place]  Referring Provider Minus Breeding, MD       Initial Encounter Date:  [place] from 05/15/2022 in [facility]  Date 05/15/22       Visit Diagnosis: 02/05/22 STEMI  02/05/22 DES x3  Patient's Home Medications on Admission:  Current Outpatient Medications:    ascorbic acid (VITAMIN C) 500 MG tablet, Take 500 mg by mouth in the morning., Disp: , Rfl:    aspirin EC 81 MG tablet, Take 81 mg by mouth in the morning., Disp: , Rfl:    BRILINTA 90 MG TABS tablet, TAKE 1 TABLET BY MOUTH TWICE  DAILY (Patient taking differently: Take 90 mg by mouth 2 (two) times daily.), Disp: 180 tablet, Rfl: 3   colchicine 0.6 MG tablet, Take 1 tablet (0.6 mg total) by mouth 2 (two) times daily., Disp: 60 tablet, Rfl: 2   FARXIGA 10 MG TABS tablet, TAKE 1 TABLET BY MOUTH DAILY (Patient taking differently: Take 10 mg by mouth daily.), Disp: 90 tablet, Rfl: 3   hydrocortisone 2.5 % cream, Apply 1 application  topically 2 (two) times daily as needed (irritation.)., Disp: , Rfl:    metroNIDAZOLE (METROCREAM) 0.75 % cream, Apply 1 application  topically 2 (two) times daily as needed (skin rash)., Disp: , Rfl:    Multiple Vitamin (MULTI-VITAMIN) tablet, Take 1 tablet by mouth in the morning. Centrum Silver, Disp: , Rfl:    MYRBETRIQ 25 MG TB24 tablet, Take 25 mg by mouth in the morning., Disp: , Rfl:    nitroGLYCERIN (NITROSTAT) 0.4 MG SL tablet, Place 1 tablet (0.4 mg total) under the tongue every 5 (five) minutes as needed for chest pain., Disp: 25 tablet, Rfl: 3   Omega-3 1000 MG CAPS, Take 1,000 mg by mouth in the morning., Disp: , Rfl:    rosuvastatin (CRESTOR) 40 MG tablet, Take 1 tablet (40 mg total) by  mouth daily., Disp: 90 tablet, Rfl: 3   spironolactone (ALDACTONE) 25 MG tablet, Take 1 tablet (25 mg total) by mouth daily., Disp: 30 tablet, Rfl: 3   tolterodine (DETROL LA) 4 MG 24 hr capsule, Take 4 mg by mouth in the morning., Disp: , Rfl:    triamcinolone (KENALOG) 0.025 % cream, Apply 1 application  topically 2 (two) times daily as needed (skin irritation)., Disp: , Rfl:   Past Medical History: Past Medical History:  Diagnosis Date   Aneurysm, ascending aorta ([place])    Cancer ([place]) 1997   Prostate cancer   Coronary artery disease    Dyslipidemia    Family history of premature CAD    NO ISCHEMIC SYMPTOMS   Hyperlipidemia    Hypertension    Thyroid nodule     Tobacco Use: Social History   Tobacco Use  Smoking Status Never  Smokeless Tobacco Never    Labs: Review Flowsheet  More data may exist      Latest Ref Rng & Units 01/15/2020 08/22/2020 09/06/2021 02/05/2022 05/11/2022  Labs for ITP Cardiac and Pulmonary Rehab  Cholestrol 100 - 199 mg/dL 148  148  165  91  133   LDL (calc) 0 - 99 mg/dL 76  73  90  42  60   HDL-C >39 mg/dL 46  56  49  38  50   Trlycerides 0 - 149 mg/dL 152  104  151  55  134   Hemoglobin A1c 4.8 - 5.6 % - 5.6  5.7  5.4  -  TCO2 22 - 32 mmol/L - - - 25  -    Capillary Blood Glucose: Lab Results  Component Value Date   GLUCAP 117 (H) 02/05/2022     Exercise Target Goals: Exercise Program Goal: Individual exercise prescription set using results from initial 6 min walk test and THRR while considering  patient's activity barriers and safety.   Exercise Prescription Goal: Initial exercise prescription builds to 30-45 minutes a day of aerobic activity, 2-3 days per week.  Home exercise guidelines will be given to patient during program as part of exercise prescription that the participant will acknowledge.  Activity Barriers & Risk Stratification:  Activity Barriers & Cardiac Risk Stratification - 05/15/22 1012       Activity Barriers & Cardiac  Risk Stratification   Activity Barriers None    Cardiac Risk Stratification High             6 Minute Walk:  6 Minute Walk     Row Name 05/15/22 0958         6 Minute Walk   Phase Initial     Distance 1813 feet     Walk Time 6 minutes     # of Rest Breaks 0     MPH 3.43     METS 3.63     RPE 11     Perceived Dyspnea  0     VO2 Peak 12.7     Symptoms No     Resting HR 76 bpm     Resting BP 118/72     Resting Oxygen Saturation  97 %     Exercise Oxygen Saturation  during 6 min walk 99 %     Max Ex. HR 110 bpm     Max Ex. BP 148/72     2 Minute Post BP 118/72              Oxygen Initial Assessment:   Oxygen Re-Evaluation:   Oxygen Discharge (Final Oxygen Re-Evaluation):   Initial Exercise Prescription:  Initial Exercise Prescription - 05/15/22 1000       Date of Initial Exercise RX and Referring Provider   Date 05/15/22    Referring Provider Minus Breeding, MD    Expected Discharge Date 07/13/22      Treadmill   MPH 2.5    Grade 0    Minutes 15    METs 2.91      NuStep   Level 3    SPM 85    Minutes 15    METs 2.8      Prescription Details   Frequency (times per week) 3    Duration Progress to 30 minutes of continuous aerobic without signs/symptoms of physical distress      Intensity   THRR 40-80% of Max Heartrate 56-113    Ratings of Perceived Exertion 11-13    Perceived Dyspnea 0-4      Progression   Progression Continue to progress workloads to maintain intensity without signs/symptoms of physical distress.      Resistance Training   Training Prescription Yes    Weight 5 lbs    Reps 10-15             Perform Capillary Blood Glucose checks as needed.  Exercise Prescription Changes:   Exercise Prescription Changes  [place] Name 05/21/22 0851 06/11/22 0830 06/25/22 1155         Response to Exercise   Blood Pressure (Admit) 102/60 104/56 98/60     Blood Pressure (Exercise) 120/70 102/72 126/70     Blood Pressure  (Exit) 110/60 100/62 98/58     Heart Rate (Admit) 83 bpm 81 bpm 93 bpm     Heart Rate (Exercise) 98 bpm 100 bpm 97 bpm     Heart Rate (Exit) 86 bpm 89 bpm 89 bpm     Rating of Perceived Exertion (Exercise) 9 11 11      Perceived Dyspnea (Exercise) 0 0 0     Symptoms 0 0 0     Comments Pt first day of exercise Reviewed MET's, goals and home ExRx Reviewed MET's     Duration Progress to 30 minutes of  aerobic without signs/symptoms of physical distress Progress to 30 minutes of  aerobic without signs/symptoms of physical distress Progress to 30 minutes of  aerobic without signs/symptoms of physical distress     Intensity THRR unchanged THRR unchanged THRR unchanged       Progression   Progression Continue to progress workloads to maintain intensity without signs/symptoms of physical distress. Continue to progress workloads to maintain intensity without signs/symptoms of physical distress. Continue to progress workloads to maintain intensity without signs/symptoms of physical distress.     Average METs 2.66 3 3.32       Resistance Training   Training Prescription Yes Yes Yes     Weight 5 lbs 5 lbs 5 lbs     Reps 10-15 10-15 10-15     Time 10 Minutes 10 Minutes 10 Minutes       Treadmill   MPH 2.5 2.7 2.7     Grade 0 0 1     Minutes 15 15 15      METs 2.91 2.99 3.44       NuStep   Level 3 4 4      SPM 85 95 95     Minutes 15 15 15      METs 2.4 3 3.2       Home Exercise Plan   Plans to continue exercise at -- Home (comment) Home (comment)     Frequency -- Add 3 additional days to program exercise sessions. Add 3 additional days to program exercise sessions.     Initial Home Exercises Provided -- 06/11/22 06/11/22              Exercise Comments:   Exercise Comments     Row Name 05/21/22 0856 06/11/22 0830 06/25/22 1158       Exercise Comments Pt first day in the CRP2 program. Pt tolerated exercise well with an average MET level of 2.66. Pt is learning his THRR, RPE and ExRx  Reviewed MET's, goals and home Ex Rx. Pt tolerated exercise well with an average MET level of 3.0. Pt will continue to exercise on his own 3 days a week by golfing or walking 30+ minutes. Pt is very happy about walking an average of 7500 steps per day for exercise. Pt feels he is making gradual progression for leg, and arm strength as well. Reviewed MET's. Pt tolerated exercise well with an average MET level of 3.32. Pt is feeling good about his exercise plan so far and had a smooth travel week last week to visit family.              Exercise Goals and Review:   Exercise  Goals     Row Name 05/15/22 0940             Exercise Goals   Increase Physical Activity Yes       Intervention Provide advice, education, support and counseling about physical activity/exercise needs.;Develop an individualized exercise prescription for aerobic and resistive training based on initial evaluation findings, risk stratification, comorbidities and participant's personal goals.       Expected Outcomes Short Term: Attend rehab on a regular basis to increase amount of physical activity.;Long Term: Exercising regularly at least 3-5 days a week.       Increase Strength and Stamina Yes       Intervention Provide advice, education, support and counseling about physical activity/exercise needs.;Develop an individualized exercise prescription for aerobic and resistive training based on initial evaluation findings, risk stratification, comorbidities and participant's personal goals.       Expected Outcomes Short Term: Increase workloads from initial exercise prescription for resistance, speed, and METs.;Short Term: Perform resistance training exercises routinely during rehab and add in resistance training at home;Long Term: Improve cardiorespiratory fitness, muscular endurance and strength as measured by increased METs and functional capacity (6MWT)       Able to understand and use rate of perceived exertion (RPE) scale  Yes       Intervention Provide education and explanation on how to use RPE scale       Expected Outcomes Short Term: Able to use RPE daily in rehab to express subjective intensity level;Long Term:  Able to use RPE to guide intensity level when exercising independently       Knowledge and understanding of Target Heart Rate Range (THRR) Yes       Intervention Provide education and explanation of THRR including how the numbers were predicted and where they are located for reference       Expected Outcomes Short Term: Able to state/look up THRR;Long Term: Able to use THRR to govern intensity when exercising independently;Short Term: Able to use daily as guideline for intensity in rehab       Able to check pulse independently Yes       Intervention Provide education and demonstration on how to check pulse in carotid and radial arteries.;Review the importance of being able to check your own pulse for safety during independent exercise       Expected Outcomes Short Term: Able to explain why pulse checking is important during independent exercise;Long Term: Able to check pulse independently and accurately       Understanding of Exercise Prescription Yes       Intervention Provide education, explanation, and written materials on patient's individual exercise prescription       Expected Outcomes Short Term: Able to explain program exercise prescription;Long Term: Able to explain home exercise prescription to exercise independently                Exercise Goals Re-Evaluation :  Exercise Goals Re-Evaluation     Row Name 05/21/22 0854 06/11/22 0830 06/25/22 1157         Exercise Goal Re-Evaluation   Exercise Goals Review Increase Physical Activity;Increase Strength and Stamina;Able to understand and use rate of perceived exertion (RPE) scale;Knowledge and understanding of Target Heart Rate Range (THRR);Understanding of Exercise Prescription Increase Physical Activity;Increase Strength and Stamina;Able  to understand and use rate of perceived exertion (RPE) scale;Knowledge and understanding of Target Heart Rate Range (THRR);Understanding of Exercise Prescription Increase Physical Activity;Increase Strength and Stamina;Able to understand and use rate of  perceived exertion (RPE) scale;Knowledge and understanding of Target Heart Rate Range (THRR);Understanding of Exercise Prescription     Comments Pt first day in the CRP2 program. Pt tolerated exercise well with an average MET level of 2.66. Pt is learning his THRR, RPE and ExRx Reviewed MET's, goals and home Ex Rx. Pt tolerated exercise well with an average MET level of 3.0. Pt will continue to exercise on his own 3 days a week by golfing or walking 30+ minutes. Pt is very happy about walking an average of 7500 steps per day for exercise. Pt feels he is making gradual progression for leg, and arm strength as well. Reviewed MET's. Pt tolerated exercise well with an average MET level of 3.32. Pt is feeling good about his exercise plan so far and had a smooth travel week last week to visit family.     Expected Outcomes Will continue to monitor pt and progress workloads as tolerated without sign or symptom Pt will continue to exercise on his own 3 days a week for 30+ mins per session. Will continue to monitor pt and progress workloads as tolerated without sign or symptom Will continue to monitor pt and progress workloads as tolerated without sign or symptom              Discharge Exercise Prescription (Final Exercise Prescription Changes):  Exercise Prescription Changes - 06/25/22 1155       Response to Exercise   Blood Pressure (Admit) 98/60    Blood Pressure (Exercise) 126/70    Blood Pressure (Exit) 98/58    Heart Rate (Admit) 93 bpm    Heart Rate (Exercise) 97 bpm    Heart Rate (Exit) 89 bpm    Rating of Perceived Exertion (Exercise) 11    Perceived Dyspnea (Exercise) 0    Symptoms 0    Comments Reviewed MET's    Duration Progress to 30  minutes of  aerobic without signs/symptoms of physical distress    Intensity THRR unchanged      Progression   Progression Continue to progress workloads to maintain intensity without signs/symptoms of physical distress.    Average METs 3.32      Resistance Training   Training Prescription Yes    Weight 5 lbs    Reps 10-15    Time 10 Minutes      Treadmill   MPH 2.7    Grade 1    Minutes 15    METs 3.44      NuStep   Level 4    SPM 95    Minutes 15    METs 3.2      Home Exercise Plan   Plans to continue exercise at Home (comment)    Frequency Add 3 additional days to program exercise sessions.    Initial Home Exercises Provided 06/11/22             Nutrition:  Target Goals: Understanding of nutrition guidelines, daily intake of sodium '1500mg'$ , cholesterol '200mg'$ , calories 30% from fat and 7% or less from saturated fats, daily to have 5 or more servings of fruits and vegetables.  Biometrics:  Pre Biometrics - 05/15/22 0903       Pre Biometrics   Waist Circumference 36.5 inches    Hip Circumference 42 inches    Waist to Hip Ratio 0.87 %    Triceps Skinfold 16 mm    % Body Fat 26 %    Grip Strength 26 kg    Flexibility 9.5 in  Single Leg Stand 10.5 seconds              Nutrition Therapy Plan and Nutrition Goals:  Nutrition Therapy & Goals - 06/15/22 1036       Nutrition Therapy   Diet Heart Healthy Diet    Drug/Food Interactions Statins/Certain Fruits      Personal Nutrition Goals   Nutrition Goal Patient to choose a variety of fruits, vegetables, whole grains, lean protein/plant protein, nonfat dairy daily as part of a heart healthy diet.    Personal Goal #2 Patient to limit daily sodium intake to '1500mg'$ .    Personal Goal #3 Patient to identify and limit daily food sources of saturated fat, trans fat, sodium, and refined carbohydrates    Comments Goals in progress. He reports following the Mediterranean diet.  Patient's recent attendance has  been somewhat variable; however, he remains engaged in the [facility] education classes and reports following a heart healthy diet. Lipid panel and A1c WNL.      Intervention Plan   Intervention Prescribe, educate and counsel regarding individualized specific dietary modifications aiming towards targeted core components such as weight, hypertension, lipid management, diabetes, heart failure and other comorbidities.    Expected Outcomes Short Term Goal: Understand basic principles of dietary content, such as calories, fat, sodium, cholesterol and nutrients.;Short Term Goal: A plan has been developed with personal nutrition goals set during dietitian appointment.;Long Term Goal: Adherence to prescribed nutrition plan.             Nutrition Assessments:  Nutrition Assessments - 05/21/22 1103       Rate Your Plate Scores   Pre Score 80            MEDIFICTS Score Key: ?70 Need to make dietary changes  40-70 Heart Healthy Diet ? 40 Therapeutic Level Cholesterol Diet   Flowsheet Row INTENSIVE CARDIAC REHAB from 05/21/2022 in [place]  Picture Your Plate Total Score on Admission 80      Picture Your Plate Scores: <82 Unhealthy dietary pattern with much room for improvement. 41-50 Dietary pattern unlikely to meet recommendations for good health and room for improvement. 51-60 More healthful dietary pattern, with some room for improvement.  >60 Healthy dietary pattern, although there may be some specific behaviors that could be improved.    Nutrition Goals Re-Evaluation:  Nutrition Goals Re-Evaluation     [place] Name 06/15/22 1036             Goals   Current Weight 182 lb 5.1 oz (82.7 kg)  weight from 06/13/2022       Expected Outcome Goals in progress. Patient's recent attendance has been somewhat variable; however, he remains engaged in the [facility] education classes and reports following a heart healthy diet. Dietary changes consistent with  Lipid panel and A1c WNL plus Weight loss of ~24# since L Heart Cath 02/05/22. He plans to maintain weight at this point.                Nutrition Goals Re-Evaluation:  Nutrition Goals Re-Evaluation     [place] Name 06/15/22 1036             Goals   Current Weight 182 lb 5.1 oz (82.7 kg)  weight from 06/13/2022       Expected Outcome Goals in progress. Patient's recent attendance has been somewhat variable; however, he remains engaged in the [facility] education classes and reports following a heart healthy diet. Dietary changes consistent with Lipid  panel and A1c WNL plus Weight loss of ~24# since L Heart Cath 02/05/22. He plans to maintain weight at this point.                Nutrition Goals Discharge (Final Nutrition Goals Re-Evaluation):  Nutrition Goals Re-Evaluation - 06/15/22 1036       Goals   Current Weight 182 lb 5.1 oz (82.7 kg)   weight from 06/13/2022   Expected Outcome Goals in progress. Patient's recent attendance has been somewhat variable; however, he remains engaged in the [facility] education classes and reports following a heart healthy diet. Dietary changes consistent with Lipid panel and A1c WNL plus Weight loss of ~24# since L Heart Cath 02/05/22. He plans to maintain weight at this point.             Psychosocial: Target Goals: Acknowledge presence or absence of significant depression and/or stress, maximize coping skills, provide positive support system. Participant is able to verbalize types and ability to use techniques and skills needed for reducing stress and depression.  Initial Review & Psychosocial Screening:  Initial Psych Review & Screening - 05/15/22 1606       Initial Review   Current issues with Current Stress Concerns    Source of Stress Concerns Family    Comments Miguel Walker has a disabled son who lives in [place].      Family Dynamics   Good Support System? Yes   Miguel Walker has his wife and sons for support            Quality of Life Scores:   Quality of Life - 05/15/22 1529       Quality of Life   Select Quality of Life      Quality of Life Scores   Health/Function Pre 24.47 %    Socioeconomic Pre 27.21 %    Psych/Spiritual Pre 26.57 %    Family Pre 23.8 %    GLOBAL Pre 25.37 %            Scores of 19 and below usually indicate a poorer quality of life in these areas.  A difference of  2-3 points is a clinically meaningful difference.  A difference of 2-3 points in the total score of the Quality of Life Index has been associated with significant improvement in overall quality of life, self-image, physical symptoms, and general health in studies assessing change in quality of life.  PHQ-9: Review Flowsheet       05/15/2022  Depression screen PHQ 2/9  Decreased Interest 0  Down, Depressed, Hopeless 0  PHQ - 2 Score 0   Interpretation of Total Score  Total Score Depression Severity:  1-4 = Minimal depression, 5-9 = Mild depression, 10-14 = Moderate depression, 15-19 = Moderately severe depression, 20-27 = Severe depression   Psychosocial Evaluation and Intervention:  Psychosocial Evaluation - 05/21/22 0741       Psychosocial Evaluation & Interventions   Interventions Encouraged to exercise with the program and follow exercise prescription;Stress management education    Comments Miguel Walker denies any psychosocial barriers to participating in Cardiac Rehab.  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 Miguel Walker does rate his stress level as medium due to adult aged son who has a disability.    Expected Outcomes Pt will contine to deny any barriers to adopting heart healthy lifestyle. Miguel Walker will utilize stress management tools he will learn to cope positive and healthy.    Continue Psychosocial Services  Follow up required  by staff             Psychosocial Re-Evaluation:  Psychosocial Re-Evaluation     [place] Name 05/25/22 1200 06/26/22 1516           Psychosocial Re-Evaluation   Current issues with  Current Stress Concerns Current Stress Concerns      Comments Miguel Walker is off to a wonderful start with positive interaction with staff and fellow participants.  Miguel Walker has completed 1 healthy mind set workshop and felt he learned techniques he can use during stressful events with attending to his adult son who has a disability Pat returned from family vacation in [place].  Miguel Walker had a wonderful time and felt this was what he needed and this has helped with his stress level.  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 and this is something that he does with his son and feels good about it      Expected Outcomes Miguel Walker will display positive and healthy stress management skill by incorporating them in every day life Miguel Walker will display positive and healthy stress management skill by incorporating them in every day life      Interventions Stress management education;Encouraged to attend Cardiac Rehabilitation for the exercise Stress management education;Encouraged to attend Cardiac Rehabilitation for the exercise      Continue Psychosocial Services  No Follow up required No Follow up required               Psychosocial Discharge (Final Psychosocial Re-Evaluation):  Psychosocial Re-Evaluation - 06/26/22 1516       Psychosocial Re-Evaluation   Current issues with Current Stress Concerns    Comments Pat returned from family vacation in [place].  Miguel Walker had a wonderful time and felt this was what he needed and this has helped with his stress level.  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 and this is something that he does with his son and feels good about it    Expected Outcomes Miguel Walker will display positive and healthy stress management skill by incorporating them in every day life    Interventions Stress management education;Encouraged to attend Cardiac Rehabilitation for the exercise    Continue Psychosocial Services  No Follow up required             Vocational Rehabilitation: Provide vocational rehab assistance to  qualifying candidates.   Vocational Rehab Evaluation & Intervention:  Vocational Rehab - 05/15/22 1611       Initial Vocational Rehab Evaluation & Intervention   Assessment shows need for Vocational Rehabilitation No   Miguel Walker is retired and does not need vocational rehab at this time            Education: Education Goals: Education classes will be provided on a weekly basis, covering required topics. Participant will state understanding/return demonstration of topics presented.    Education - 06/25/22 0900       Education   Cardiac Education Topics Pritikin    Select Core Videos      Core Videos   Educator Exercise Physiologist    Select Nutrition    Nutrition Nutrition Action Plan    Instruction Review Code 1- Verbalizes Understanding    Class Start Time [phone]    Class Stop Time 0855    Class Time Calculation (min) 43 min             Core Videos: Exercise    Move It!  Clinical staff conducted group or individual video education  with verbal and written material and guidebook.  Patient learns the recommended Pritikin exercise program. Exercise with the goal of living a long, healthy life. Some of the health benefits of exercise include controlled diabetes, healthier blood pressure levels, improved cholesterol levels, improved heart and lung capacity, improved sleep, and better body composition. Everyone should speak with their doctor before starting or changing an exercise routine.  Biomechanical Limitations Clinical staff conducted group or individual video education with verbal and written material and guidebook.  Patient learns how biomechanical limitations can impact exercise and how we can mitigate and possibly overcome limitations to have an impactful and balanced exercise routine.  Body Composition Clinical staff conducted group or individual video education with verbal and written material and guidebook.  Patient learns that body composition (ratio of muscle  mass to fat mass) is a key component to assessing overall fitness, rather than body weight alone. Increased fat mass, especially visceral belly fat, can put [place] at increased risk for metabolic syndrome, type 2 diabetes, heart disease, and even death. It is recommended to combine diet and exercise (cardiovascular and resistance training) to improve your body composition. Seek guidance from your physician and exercise physiologist before implementing an exercise routine.  Exercise Action Plan Clinical staff conducted group or individual video education with verbal and written material and guidebook.  Patient learns the recommended strategies to achieve and enjoy long-term exercise adherence, including variety, self-motivation, self-efficacy, and positive decision making. Benefits of exercise include fitness, good health, weight management, more energy, better sleep, less stress, and overall well-being.  Medical   Heart Disease Risk Reduction Clinical staff conducted group or individual video education with verbal and written material and guidebook.  Patient learns our heart is our most vital organ as it circulates oxygen, nutrients, white blood cells, and hormones throughout the entire body, and carries waste away. Data supports a plant-based eating plan like the Pritikin Program for its effectiveness in slowing progression of and reversing heart disease. The video provides a number of recommendations to address heart disease.   Metabolic Syndrome and Belly Fat  Clinical staff conducted group or individual video education with verbal and written material and guidebook.  Patient learns what metabolic syndrome is, how it leads to heart disease, and how one can reverse it and keep it from coming back. You have metabolic syndrome if you have 3 of the following 5 criteria: abdominal obesity, high blood pressure, high triglycerides, low HDL cholesterol, and high blood sugar.  Hypertension and Heart  Disease Clinical staff conducted group or individual video education with verbal and written material and guidebook.  Patient learns that high blood pressure, or hypertension, is very common in the [place]. Hypertension is largely due to excessive salt intake, but other important risk factors include being overweight, physical inactivity, drinking too much alcohol, smoking, and not eating enough potassium from fruits and vegetables. High blood pressure is a leading risk factor for heart attack, stroke, congestive heart failure, dementia, kidney failure, and premature death. Long-term effects of excessive salt intake include stiffening of the arteries and thickening of heart muscle and organ damage. Recommendations include ways to reduce hypertension and the risk of heart disease.  Diseases of Our Time - Focusing on Diabetes Clinical staff conducted group or individual video education with verbal and written material and guidebook.  Patient learns why the best way to stop diseases of our time is prevention, through food and other lifestyle changes. Medicine (such as prescription pills and surgeries) is often  only a Band-Aid on the problem, not a long-term solution. Most common diseases of our time include obesity, type 2 diabetes, hypertension, heart disease, and cancer. The Pritikin Program is recommended and has been proven to help reduce, reverse, and/or prevent the damaging effects of metabolic syndrome.  Nutrition   Overview of the Pritikin Eating Plan  Clinical staff conducted group or individual video education with verbal and written material and guidebook.  Patient learns about the [place] for disease risk reduction. The [place] emphasizes a wide variety of unrefined, minimally-processed carbohydrates, like fruits, vegetables, whole grains, and legumes. Go, Caution, and Stop food choices are explained. Plant-based and lean animal proteins are emphasized. Rationale  provided for low sodium intake for blood pressure control, low added sugars for blood sugar stabilization, and low added fats and oils for coronary artery disease risk reduction and weight management.  Calorie Density  Clinical staff conducted group or individual video education with verbal and written material and guidebook.  Patient learns about calorie density and how it impacts the Pritikin Eating Plan. Knowing the characteristics of the food you choose will help you decide whether those foods will lead to weight gain or weight loss, and whether you want to consume more or less of them. Weight loss is usually a side effect of the Pritikin Eating Plan because of its focus on low calorie-dense foods.  Label Reading  Clinical staff conducted group or individual video education with verbal and written material and guidebook.  Patient learns about the Pritikin recommended label reading guidelines and corresponding recommendations regarding calorie density, added sugars, sodium content, and whole grains.  Dining Out - Part 1  Clinical staff conducted group or individual video education with verbal and written material and guidebook.  Patient learns that restaurant meals can be sabotaging because they can be so high in calories, fat, sodium, and/or sugar. Patient learns recommended strategies on how to positively address this and avoid unhealthy pitfalls.  Facts on Fats  Clinical staff conducted group or individual video education with verbal and written material and guidebook.  Patient learns that lifestyle modifications can be just as effective, if not more so, as many medications for lowering your risk of heart disease. A Pritikin lifestyle can help to reduce your risk of inflammation and atherosclerosis (cholesterol build-up, or plaque, in the artery walls). Lifestyle interventions such as dietary choices and physical activity address the cause of atherosclerosis. A review of the types of fats and  their impact on blood cholesterol levels, along with dietary recommendations to reduce fat intake is also included.  Nutrition Action Plan  Clinical staff conducted group or individual video education with verbal and written material and guidebook.  Patient learns how to incorporate Pritikin recommendations into their lifestyle. Recommendations include planning and keeping personal health goals in mind as an important part of their success.  Healthy Mind-Set    Healthy Minds, Bodies, Hearts  Clinical staff conducted group or individual video education with verbal and written material and guidebook.  Patient learns how to identify when they are stressed. Video will discuss the impact of that stress, as well as the many benefits of stress management. Patient will also be introduced to stress management techniques. The way we think, act, and feel has an impact on our hearts.  How Our Thoughts Can Heal Our Hearts  Clinical staff conducted group or individual video education with verbal and written material and guidebook.  Patient learns that negative thoughts can cause depression  and anxiety. This can result in negative lifestyle behavior and serious health problems. Cognitive behavioral therapy is an effective method to help control our thoughts in order to change and improve our emotional outlook.  Additional Videos:  Exercise    Improving Performance  Clinical staff conducted group or individual video education with verbal and written material and guidebook.  Patient learns to use a non-linear approach by alternating intensity levels and lengths of time spent exercising to help burn more calories and lose more body fat. Cardiovascular exercise helps improve heart health, metabolism, hormonal balance, blood sugar control, and recovery from fatigue. Resistance training improves strength, endurance, balance, coordination, reaction time, metabolism, and muscle mass. Flexibility exercise improves  circulation, posture, and balance. Seek guidance from your physician and exercise physiologist before implementing an exercise routine and learn your capabilities and proper form for all exercise.  Introduction to Yoga  Clinical staff conducted group or individual video education with verbal and written material and guidebook.  Patient learns about yoga, a discipline of the coming together of mind, breath, and body. The benefits of yoga include improved flexibility, improved range of motion, better posture and core strength, increased lung function, weight loss, and positive self-image. Yoga's heart health benefits include lowered blood pressure, healthier heart rate, decreased cholesterol and triglyceride levels, improved immune function, and reduced stress. Seek guidance from your physician and exercise physiologist before implementing an exercise routine and learn your capabilities and proper form for all exercise.  Medical   Aging: Enhancing Your Quality of Life  Clinical staff conducted group or individual video education with verbal and written material and guidebook.  Patient learns key strategies and recommendations to stay in good physical health and enhance quality of life, such as prevention strategies, having an advocate, securing a [place], and keeping a list of medications and system for tracking them. It also discusses how to avoid risk for bone loss.  Biology of Weight Control  Clinical staff conducted group or individual video education with verbal and written material and guidebook.  Patient learns that weight gain occurs because we consume more calories than we burn (eating more, moving less). Even if your body weight is normal, you may have higher ratios of fat compared to muscle mass. Too much body fat puts you at increased risk for cardiovascular disease, heart attack, stroke, type 2 diabetes, and obesity-related cancers. In addition to exercise,  following the [place] can help reduce your risk.  Decoding Lab Results  Clinical staff conducted group or individual video education with verbal and written material and guidebook.  Patient learns that lab test reflects one measurement whose values change over time and are influenced by many factors, including medication, stress, sleep, exercise, food, hydration, pre-existing medical conditions, and more. It is recommended to use the knowledge from this video to become more involved with your lab results and evaluate your numbers to speak with your doctor.   Diseases of Our Time - Overview  Clinical staff conducted group or individual video education with verbal and written material and guidebook.  Patient learns that according to the CDC, 50% to 70% of chronic diseases (such as obesity, type 2 diabetes, elevated lipids, hypertension, and heart disease) are avoidable through lifestyle improvements including healthier food choices, listening to satiety cues, and increased physical activity.  Sleep Disorders Clinical staff conducted group or individual video education with verbal and written material and guidebook.  Patient learns how good quality and duration  of sleep are important to overall health and well-being. Patient also learns about sleep disorders and how they impact health along with recommendations to address them, including discussing with a physician.  Nutrition  Dining Out - Part 2 Clinical staff conducted group or individual video education with verbal and written material and guidebook.  Patient learns how to plan ahead and communicate in order to maximize their dining experience in a healthy and nutritious manner. Included are recommended food choices based on the type of restaurant the patient is visiting.   Fueling a Best boy conducted group or individual video education with verbal and written material and guidebook.  There is a strong  connection between our food choices and our health. Diseases like obesity and type 2 diabetes are very prevalent and are in large-part due to lifestyle choices. The Pritikin Eating Plan provides plenty of food and hunger-curbing satisfaction. It is easy to follow, affordable, and helps reduce health risks.  Menu Workshop  Clinical staff conducted group or individual video education with verbal and written material and guidebook.  Patient learns that restaurant meals can sabotage health goals because they are often packed with calories, fat, sodium, and sugar. Recommendations include strategies to plan ahead and to communicate with the manager, chef, or server to help order a healthier meal.  Planning Your Eating Strategy  Clinical staff conducted group or individual video education with verbal and written material and guidebook.  Patient learns about the [place] and its benefit of reducing the risk of disease. The [place] does not focus on calories. Instead, it emphasizes high-quality, nutrient-rich foods. By knowing the characteristics of the foods, we choose, we can determine their calorie density and make informed decisions.  Targeting Your Nutrition Priorities  Clinical staff conducted group or individual video education with verbal and written material and guidebook.  Patient learns that lifestyle habits have a tremendous impact on disease risk and progression. This video provides eating and physical activity recommendations based on your personal health goals, such as reducing LDL cholesterol, losing weight, preventing or controlling type 2 diabetes, and reducing high blood pressure.  Vitamins and Minerals  Clinical staff conducted group or individual video education with verbal and written material and guidebook.  Patient learns different ways to obtain key vitamins and minerals, including through a recommended healthy diet. It is important to discuss all supplements  you take with your doctor.   Healthy Mind-Set    Smoking Cessation  Clinical staff conducted group or individual video education with verbal and written material and guidebook.  Patient learns that cigarette smoking and tobacco addiction pose a serious health risk which affects millions of people. Stopping smoking will significantly reduce the risk of heart disease, lung disease, and many forms of cancer. Recommended strategies for quitting are covered, including working with your doctor to develop a successful plan.  Culinary   Becoming a Financial trader conducted group or individual video education with verbal and written material and guidebook.  Patient learns that cooking at home can be healthy, cost-effective, quick, and puts them in control. Keys to cooking healthy recipes will include looking at your recipe, assessing your equipment needs, planning ahead, making it simple, choosing cost-effective seasonal ingredients, and limiting the use of added fats, salts, and sugars.  Cooking - Breakfast and Snacks  Clinical staff conducted group or individual video education with verbal and written material and guidebook.  Patient learns how important breakfast is to  satiety and nutrition through the entire day. Recommendations include key foods to eat during breakfast to help stabilize blood sugar levels and to prevent overeating at meals later in the day. Planning ahead is also a key component.  Cooking - Human resources officer conducted group or individual video education with verbal and written material and guidebook.  Patient learns eating strategies to improve overall health, including an approach to cook more at home. Recommendations include thinking of animal protein as a side on your plate rather than center stage and focusing instead on lower calorie dense options like vegetables, fruits, whole grains, and plant-based proteins, such as beans. Making sauces in large  quantities to freeze for later and leaving the skin on your vegetables are also recommended to maximize your experience.  Cooking - Healthy Salads and Dressing Clinical staff conducted group or individual video education with verbal and written material and guidebook.  Patient learns that vegetables, fruits, whole grains, and legumes are the foundations of the [place]. Recommendations include how to incorporate each of these in flavorful and healthy salads, and how to create homemade salad dressings. Proper handling of ingredients is also covered. Cooking - Soups and [employer] - Soups and Desserts Clinical staff conducted group or individual video education with verbal and written material and guidebook.  Patient learns that Pritikin soups and desserts make for easy, nutritious, and delicious snacks and meal components that are low in sodium, fat, sugar, and calorie density, while high in vitamins, minerals, and filling fiber. Recommendations include simple and healthy ideas for soups and desserts.   Overview     The Pritikin Solution Program Overview Clinical staff conducted group or individual video education with verbal and written material and guidebook.  Patient learns that the results of the [place] Program have been documented in more than 100 articles published in peer-reviewed journals, and the benefits include reducing risk factors for (and, in some cases, even reversing) high cholesterol, high blood pressure, type 2 diabetes, obesity, and more! An overview of the three key pillars of the Pritikin Program will be covered: eating well, doing regular exercise, and having a healthy mind-set.  WORKSHOPS  Exercise: Exercise Basics: Building Your Action Plan Clinical staff led group instruction and group discussion with PowerPoint presentation and patient guidebook. To enhance the learning environment the use of posters, models and videos may be added. At the conclusion of  this workshop, patients will comprehend the difference between physical activity and exercise, as well as the benefits of incorporating both, into their routine. Patients will understand the FITT (Frequency, Intensity, Time, and Type) principle and how to use it to build an exercise action plan. In addition, safety concerns and other considerations for exercise and cardiac rehab will be addressed by the presenter. The purpose of this lesson is to promote a comprehensive and effective weekly exercise routine in order to improve patients' overall level of fitness.   Managing Heart Disease: Your Path to a Healthier Heart Clinical staff led group instruction and group discussion with PowerPoint presentation and patient guidebook. To enhance the learning environment the use of posters, models and videos may be added.At the conclusion of this workshop, patients will understand the anatomy and physiology of the heart. Additionally, they will understand how Pritikin's three pillars impact the risk factors, the progression, and the management of heart disease.  The purpose of this lesson is to provide a high-level overview of the heart, heart disease, and how the Pritikin  lifestyle positively impacts risk factors.  Exercise Biomechanics Clinical staff led group instruction and group discussion with PowerPoint presentation and patient guidebook. To enhance the learning environment the use of posters, models and videos may be added. Patients will learn how the structural parts of their bodies function and how these functions impact their daily activities, movement, and exercise. Patients will learn how to promote a neutral spine, learn how to manage pain, and identify ways to improve their physical movement in order to promote healthy living. The purpose of this lesson is to expose patients to common physical limitations that impact physical activity. Participants will learn practical ways to adapt and  manage aches and pains, and to minimize their effect on regular exercise. Patients will learn how to maintain good posture while sitting, walking, and lifting.  Balance Training and Fall Prevention  Clinical staff led group instruction and group discussion with PowerPoint presentation and patient guidebook. To enhance the learning environment the use of posters, models and videos may be added. At the conclusion of this workshop, patients will understand the importance of their sensorimotor skills (vision, proprioception, and the vestibular system) in maintaining their ability to balance as they age. Patients will apply a variety of balancing exercises that are appropriate for their current level of function. Patients will understand the common causes for poor balance, possible solutions to these problems, and ways to modify their physical environment in order to minimize their fall risk. The purpose of this lesson is to teach patients about the importance of maintaining balance as they age and ways to minimize their risk of falling.  WORKSHOPS   Nutrition:  Fueling a Scientist, research (physical sciences) led group instruction and group discussion with PowerPoint presentation and patient guidebook. To enhance the learning environment the use of posters, models and videos may be added. Patients will review the foundational principles of the [place] and understand what constitutes a serving size in each of the food groups. Patients will also learn Pritikin-friendly foods that are better choices when away from home and review make-ahead meal and snack options. Calorie density will be reviewed and applied to three nutrition priorities: weight maintenance, weight loss, and weight gain. The purpose of this lesson is to reinforce (in a group setting) the key concepts around what patients are recommended to eat and how to apply these guidelines when away from home by planning and selecting Pritikin-friendly  options. Patients will understand how calorie density may be adjusted for different weight management goals.  Mindful Eating  Clinical staff led group instruction and group discussion with PowerPoint presentation and patient guidebook. To enhance the learning environment the use of posters, models and videos may be added. Patients will briefly review the concepts of the [place] and the importance of low-calorie dense foods. The concept of mindful eating will be introduced as well as the importance of paying attention to internal hunger signals. Triggers for non-hunger eating and techniques for dealing with triggers will be explored. The purpose of this lesson is to provide patients with the opportunity to review the basic principles of the [place], discuss the value of eating mindfully and how to measure internal cues of hunger and fullness using the Hunger Scale. Patients will also discuss reasons for non-hunger eating and learn strategies to use for controlling emotional eating.  Targeting Your Nutrition Priorities Clinical staff led group instruction and group discussion with PowerPoint presentation and patient guidebook. To enhance the learning environment the use of  posters, models and videos may be added. Patients will learn how to determine their genetic susceptibility to disease by reviewing their family history. Patients will gain insight into the importance of diet as part of an overall healthy lifestyle in mitigating the impact of genetics and other environmental insults. The purpose of this lesson is to provide patients with the opportunity to assess their personal nutrition priorities by looking at their family history, their own health history and current risk factors. Patients will also be able to discuss ways of prioritizing and modifying the [place] for their highest risk areas  Menu  Clinical staff led group instruction and group discussion with  PowerPoint presentation and patient guidebook. To enhance the learning environment the use of posters, models and videos may be added. Using menus brought in from [employer], or printed from [employer], patients will apply the [place] dining out guidelines that were presented in the [employer] video. Patients will also be able to practice these guidelines in a variety of provided scenarios. The purpose of this lesson is to provide patients with the opportunity to practice hands-on learning of the [place] with actual menus and practice scenarios.  Label Reading Clinical staff led group instruction and group discussion with PowerPoint presentation and patient guidebook. To enhance the learning environment the use of posters, models and videos may be added. Patients will review and discuss the [facility] label reading guidelines presented in [employer]'s Label Reading Educational series video. Using fool labels brought in from local grocery stores and markets, patients will apply the label reading guidelines and determine if the packaged food meet the [facility] guidelines. The purpose of this lesson is to provide patients with the opportunity to review, discuss, and practice hands-on learning of the [facility] Label Reading guidelines with actual packaged food labels. [place] Workshops are designed to teach patients ways to prepare quick, simple, and affordable recipes at home. The importance of nutrition's role in chronic disease risk reduction is reflected in its emphasis in the overall [facility] program. By learning how to prepare essential core [facility] Eating Plan recipes, patients will increase control over what they eat; be able to customize the flavor of foods without the use of added salt, sugar, or fat; and improve the quality of the food they consume. By learning a set of core recipes which are easily assembled, quickly  prepared, and affordable, patients are more likely to prepare more healthy foods at home. These workshops focus on convenient breakfasts, simple entres, side dishes, and desserts which can be prepared with minimal effort and are consistent with nutrition recommendations for cardiovascular risk reduction. Cooking [employer] are taught by a Engineer, materials (RD) who has been trained by the [employer]. The chef or RD has a clear understanding of the importance of minimizing - if not completely eliminating - added fat, sugar, and sodium in recipes. Throughout the series of [place] Workshop sessions, patients will learn about healthy ingredients and efficient methods of cooking to build confidence in their capability to prepare    Cooking School weekly topics:  Adding Flavor- Sodium-Free  Fast and Healthy Breakfasts  Powerhouse Plant-Based Proteins  Satisfying Salads and Dressings  Simple Sides and Sauces  International Cuisine-Spotlight on the [place] Zones  Delicious Desserts  Savory Soups  [employer] - Meals in a Agricultural consultant Appetizers and Snacks  Comforting Weekend Breakfasts  One-Pot Wonders   Fast  Evening Meals  Easy Entertaining  Personalizing Your [facility] Plate  WORKSHOPS   Healthy Mindset (Psychosocial): New Thoughts, New Behaviors Clinical staff led group instruction and group discussion with PowerPoint presentation and patient guidebook. To enhance the learning environment the use of posters, models and videos may be added. Patients will learn and practice techniques for developing effective health and lifestyle goals. Patients will be able to effectively apply the goal setting process learned to develop at least one new personal goal.  The purpose of this lesson is to expose patients to a new skill set of behavior modification techniques such as techniques setting SMART goals, overcoming barriers, and achieving new thoughts and new  behaviors.  Managing Moods and Relationships Clinical staff led group instruction and group discussion with PowerPoint presentation and patient guidebook. To enhance the learning environment the use of posters, models and videos may be added. Patients will learn how emotional and chronic stress factors can impact their health and relationships. They will learn healthy ways to manage their moods and utilize positive coping mechanisms. In addition, I[facility] patients will learn ways to improve communication skills. The purpose of this lesson is to expose patients to ways of understanding how one's mood and health are intimately connected. Developing a healthy outlook can help build positive relationships and connections with others. Patients will understand the importance of utilizing effective communication skills that include actively listening and being heard. They will learn and understand the importance of the "4 Cs" and especially Connections in fostering of a Healthy Mind-Set.  Healthy Sleep for a Healthy Heart Clinical staff led group instruction and group discussion with PowerPoint presentation and patient guidebook. To enhance the learning environment the use of posters, models and videos may be added. At the conclusion of this workshop, patients will be able to demonstrate knowledge of the importance of sleep to overall health, well-being, and quality of life. They will understand the symptoms of, and treatments for, common sleep disorders. Patients will also be able to identify daytime and nighttime behaviors which impact sleep, and they will be able to apply these tools to help manage sleep-related challenges. The purpose of this lesson is to provide patients with a general overview of sleep and outline the importance of quality sleep. Patients will learn about a few of the most common sleep disorders. Patients will also be introduced to the concept of "sleep hygiene," and discover ways to self-manage  certain sleeping problems through simple daily behavior changes. Finally, the workshop will motivate patients by clarifying the links between quality sleep and their goals of heart-healthy living.   Recognizing and Reducing Stress Clinical staff led group instruction and group discussion with PowerPoint presentation and patient guidebook. To enhance the learning environment the use of posters, models and videos may be added. At the conclusion of this workshop, patients will be able to understand the types of stress reactions, differentiate between acute and chronic stress, and recognize the impact that chronic stress has on their health. They will also be able to apply different coping mechanisms, such as reframing negative self-talk. Patients will have the opportunity to practice a variety of stress management techniques, such as deep abdominal breathing, progressive muscle relaxation, and/or guided imagery.  The purpose of this lesson is to educate patients on the role of stress in their lives and to provide healthy techniques for coping with it.  Learning Barriers/Preferences:  Learning Barriers/Preferences - 05/15/22 1609       Learning Barriers/Preferences   Learning Barriers Sight;Hearing  Wears glasses has bilateral hearing aides   Learning Preferences Skilled Demonstration;Pictoral;Video             Education Topics:  Knowledge Questionnaire Score:  Knowledge Questionnaire Score - 05/15/22 1537       Knowledge Questionnaire Score   Pre Score 23/24             Core Components/Risk Factors/Patient Goals at Admission:  Personal Goals and Risk Factors at Admission - 05/15/22 0938       Core Components/Risk Factors/Patient Goals on Admission   Hypertension Yes    Intervention Provide education on lifestyle modifcations including regular physical activity/exercise, weight management, moderate sodium restriction and increased consumption of fresh fruit, vegetables, and low  fat dairy, alcohol moderation, and smoking cessation.;Monitor prescription use compliance.    Expected Outcomes Short Term: Continued assessment and intervention until BP is < 140/2mm HG in hypertensive participants. < 130/56mm HG in hypertensive participants with diabetes, heart failure or chronic kidney disease.;Long Term: Maintenance of blood pressure at goal levels.    Lipids Yes    Intervention Provide education and support for participant on nutrition & aerobic/resistive exercise along with prescribed medications to achieve LDL '70mg'$ , HDL >$Remo'40mg'ahVQQ$ .    Expected Outcomes Short Term: Participant states understanding of desired cholesterol values and is compliant with medications prescribed. Participant is following exercise prescription and nutrition guidelines.;Long Term: Cholesterol controlled with medications as prescribed, with individualized exercise RX and with personalized nutrition plan. Value goals: LDL < $Rem'70mg'kQeg$ , HDL > 40 mg.    Stress Yes    Intervention Offer individual and/or small group education and counseling on adjustment to heart disease, stress management and health-related lifestyle change. Teach and support self-help strategies.;Refer participants experiencing significant psychosocial distress to appropriate mental health specialists for further evaluation and treatment. When possible, include family members and significant others in education/counseling sessions.    Expected Outcomes Short Term: Participant demonstrates changes in health-related behavior, relaxation and other stress management skills, ability to obtain effective social support, and compliance with psychotropic medications if prescribed.;Long Term: Emotional wellbeing is indicated by absence of clinically significant psychosocial distress or social isolation.             Core Components/Risk Factors/Patient Goals Review:   Goals and Risk Factor Review     Row Name 05/25/22 1205 06/26/22 1520           Core  Components/Risk Factors/Patient Goals Review   Personal Goals Review Hypertension;Lipids;Stress Hypertension;Lipids;Stress      Review Miguel Walker is off to great start in Intensive Cardiac Rehab with the completion of 3 exercise and 3 education sessions.  Pt reports compiance with heart related medicatios and his vital signsremain within norma limits pre/post exercise.  Miguel Walker has met informally with the RD, Sam and he has attended nutritional education sessions regarding lipid management and compliance to statin therapy Miguel Walker has attended 10 exercise and 10 education classes in Intensive Cardiac rehab.  Pt vital signs remail with normal limits.  Pt with brief overnight stay in the hospital for pericarditis and increase fluid.  Miguel Walker is compliant with statin therapy and has made many changes in his diet.      Expected Outcomes Miguel Walker will adopt a heart healthy lifestyle in accordance to the three pillars learned for [facility] Intensive cardiac rehab: exercise, heart healthy nutrition and healthy mindset. Miguel Walker will adopt a heart healthy lifestyle in accordance to the three pillars learned for [facility] Intensive cardiac rehab: exercise, heart healthy nutrition and healthy mindset.  Core Components/Risk Factors/Patient Goals at Discharge (Final Review):   Goals and Risk Factor Review - 06/26/22 1520       Core Components/Risk Factors/Patient Goals Review   Personal Goals Review Hypertension;Lipids;Stress    Review Miguel Walker has attended 10 exercise and 10 education classes in Intensive Cardiac rehab.  Pt vital signs remail with normal limits.  Pt with brief overnight stay in the hospital for pericarditis and increase fluid.  Miguel Walker is compliant with statin therapy and has made many changes in his diet.    Expected Outcomes Miguel Walker will adopt a heart healthy lifestyle in accordance to the three pillars learned for [facility] Intensive cardiac rehab: exercise, heart healthy nutrition and healthy mindset.              ITP Comments:  ITP Comments     Row Name 05/15/22 0903 05/25/22 1159 06/27/22 0836       ITP Comments Medical Director- Dr. Fransico Him, MD. Introduction to [facility] program at cardiac rehab orientation. Reviewed Initial resource packet with the patient. Patient took packet home to review. Miguel Walker has completed 3 exercise and 3 education sessions during this 30 day ITP Review 30 day ITP Review              Comments: Pt is making expected progress toward personal goals after completing 11 sessions. Recommend continued exercise and life style modification education including  stress management and relaxation techniques to decrease cardiac risk profile. Cherre Huger, BSN Cardiac and Training and development officer

## 2022-06-27 NOTE — Patient Instructions (Addendum)
Medication Instructions:  No Changes *If you need a refill on your cardiac medications before your next appointment, please call your pharmacy*   Lab Work: No Labs If you have labs (blood work) drawn today and your tests are completely normal, you will receive your results only by: [place] (if you have MyChart) OR A paper copy in the mail If you have any lab test that is abnormal or we need to change your treatment, we will call you to review the results.   Testing/Procedures: No Testing   Follow-Up: At [facility], you and your health needs are our priority.  As part of our continuing mission to provide you with exceptional heart care, we have created designated Provider Care Teams.  These Care Teams include your primary Cardiologist (physician) and Advanced Practice Providers (APPs -  Physician Assistants and Nurse Practitioners) who all work together to provide you with the care you need, when you need it.  We recommend signing up for the patient portal called "MyChart".  Sign up information is provided on this After Visit Summary.  MyChart is used to connect with patients for Virtual Visits (Telemedicine).  Patients are able to view lab/test results, encounter notes, upcoming appointments, etc.  Non-urgent messages can be sent to your provider as well.   To learn more about what you can do with MyChart, go to [email or web address].    Your next appointment:   Keep Scheduled Appointment  The format for your next appointment:   In Person  Provider:   Minus Breeding, MD     Other Instructions Continue colchicine  for 3 months. If Blood Pressure less than 95 systolic consistently reduec Spirolactone to Half Tablet and Call office.   Important Information About Sugar

## 2022-06-29 ENCOUNTER — Encounter (HOSPITAL_COMMUNITY)
Admission: RE | Admit: 2022-06-29 | Discharge: 2022-06-29 | Disposition: A | Discharge status: Home / Self Care | Payer: Medicare HMO | Source: Ambulatory Visit | Attending: Cardiology | Admitting: Cardiology

## 2022-06-29 DIAGNOSIS — Z955 Presence of coronary angioplasty implant and graft: Secondary | ICD-10-CM | POA: Diagnosis not present

## 2022-06-29 DIAGNOSIS — I213 ST elevation (STEMI) myocardial infarction of unspecified site: Secondary | ICD-10-CM

## 2022-07-02 ENCOUNTER — Encounter (HOSPITAL_COMMUNITY)
Admission: RE | Admit: 2022-07-02 | Discharge: 2022-07-02 | Disposition: A | Discharge status: Home / Self Care | Payer: Medicare HMO | Source: Ambulatory Visit | Attending: Cardiology | Admitting: Cardiology

## 2022-07-02 DIAGNOSIS — I213 ST elevation (STEMI) myocardial infarction of unspecified site: Secondary | ICD-10-CM

## 2022-07-02 DIAGNOSIS — Z955 Presence of coronary angioplasty implant and graft: Secondary | ICD-10-CM | POA: Diagnosis not present

## 2022-07-04 ENCOUNTER — Encounter (HOSPITAL_COMMUNITY)
Admission: RE | Admit: 2022-07-04 | Discharge: 2022-07-04 | Disposition: A | Discharge status: Home / Self Care | Payer: Medicare HMO | Source: Ambulatory Visit | Attending: Cardiology | Admitting: Cardiology

## 2022-07-04 DIAGNOSIS — Z955 Presence of coronary angioplasty implant and graft: Secondary | ICD-10-CM | POA: Diagnosis not present

## 2022-07-04 DIAGNOSIS — I213 ST elevation (STEMI) myocardial infarction of unspecified site: Secondary | ICD-10-CM | POA: Insufficient documentation

## 2022-07-05 ENCOUNTER — Telehealth: Payer: Self-pay | Admitting: Cardiology

## 2022-07-05 NOTE — Telephone Encounter (Signed)
Spoke with pt he states that he was shaving and it "has been an hour or so" and has bled thru quite a few band-aids. Spoke with pt answered all questions. He will call back after hours if bleeding persists or go to the ER. Verbalized understanding.

## 2022-07-06 ENCOUNTER — Encounter (HOSPITAL_COMMUNITY)
Admission: RE | Admit: 2022-07-06 | Discharge: 2022-07-06 | Disposition: A | Discharge status: Home / Self Care | Payer: Medicare HMO | Source: Ambulatory Visit | Attending: Cardiology | Admitting: Cardiology

## 2022-07-06 DIAGNOSIS — Z955 Presence of coronary angioplasty implant and graft: Secondary | ICD-10-CM

## 2022-07-06 DIAGNOSIS — I213 ST elevation (STEMI) myocardial infarction of unspecified site: Secondary | ICD-10-CM | POA: Diagnosis not present

## 2022-07-09 ENCOUNTER — Encounter (HOSPITAL_COMMUNITY)
Admission: RE | Admit: 2022-07-09 | Discharge: 2022-07-09 | Disposition: A | Discharge status: Home / Self Care | Payer: Medicare HMO | Source: Ambulatory Visit | Attending: Cardiology | Admitting: Cardiology

## 2022-07-09 DIAGNOSIS — I213 ST elevation (STEMI) myocardial infarction of unspecified site: Secondary | ICD-10-CM | POA: Diagnosis not present

## 2022-07-09 DIAGNOSIS — Z955 Presence of coronary angioplasty implant and graft: Secondary | ICD-10-CM

## 2022-07-11 ENCOUNTER — Encounter (HOSPITAL_COMMUNITY)
Admission: RE | Admit: 2022-07-11 | Discharge: 2022-07-11 | Disposition: A | Discharge status: Home / Self Care | Payer: Medicare HMO | Source: Ambulatory Visit | Attending: Cardiology | Admitting: Cardiology

## 2022-07-11 DIAGNOSIS — Z955 Presence of coronary angioplasty implant and graft: Secondary | ICD-10-CM

## 2022-07-11 DIAGNOSIS — I213 ST elevation (STEMI) myocardial infarction of unspecified site: Secondary | ICD-10-CM

## 2022-07-12 ENCOUNTER — Ambulatory Visit: Payer: Medicare HMO | Admitting: Physician Assistant

## 2022-07-13 ENCOUNTER — Encounter (HOSPITAL_COMMUNITY)
Admission: RE | Admit: 2022-07-13 | Discharge: 2022-07-13 | Disposition: A | Discharge status: Home / Self Care | Payer: Medicare HMO | Source: Ambulatory Visit | Attending: Cardiology | Admitting: Cardiology

## 2022-07-13 DIAGNOSIS — I213 ST elevation (STEMI) myocardial infarction of unspecified site: Secondary | ICD-10-CM

## 2022-07-13 DIAGNOSIS — Z955 Presence of coronary angioplasty implant and graft: Secondary | ICD-10-CM

## 2022-07-16 ENCOUNTER — Encounter (HOSPITAL_COMMUNITY)
Admission: RE | Admit: 2022-07-16 | Discharge: 2022-07-16 | Disposition: A | Discharge status: Home / Self Care | Payer: Medicare HMO | Source: Ambulatory Visit | Attending: Cardiology | Admitting: Cardiology

## 2022-07-16 DIAGNOSIS — I213 ST elevation (STEMI) myocardial infarction of unspecified site: Secondary | ICD-10-CM

## 2022-07-16 DIAGNOSIS — Z955 Presence of coronary angioplasty implant and graft: Secondary | ICD-10-CM

## 2022-07-18 ENCOUNTER — Encounter (HOSPITAL_COMMUNITY)
Admission: RE | Admit: 2022-07-18 | Discharge: 2022-07-18 | Disposition: A | Discharge status: Home / Self Care | Payer: Medicare HMO | Source: Ambulatory Visit | Attending: Cardiology | Admitting: Cardiology

## 2022-07-18 DIAGNOSIS — Z955 Presence of coronary angioplasty implant and graft: Secondary | ICD-10-CM

## 2022-07-18 DIAGNOSIS — I213 ST elevation (STEMI) myocardial infarction of unspecified site: Secondary | ICD-10-CM | POA: Diagnosis not present

## 2022-07-20 ENCOUNTER — Other Ambulatory Visit: Payer: Self-pay | Admitting: Cardiology

## 2022-07-20 ENCOUNTER — Encounter (HOSPITAL_COMMUNITY): Payer: Medicare HMO

## 2022-07-20 DIAGNOSIS — R6889 Other general symptoms and signs: Secondary | ICD-10-CM

## 2022-07-23 ENCOUNTER — Encounter (HOSPITAL_COMMUNITY)
Admission: RE | Admit: 2022-07-23 | Discharge: 2022-07-23 | Disposition: A | Discharge status: Home / Self Care | Payer: Medicare HMO | Source: Ambulatory Visit | Attending: Cardiology | Admitting: Cardiology

## 2022-07-23 DIAGNOSIS — I213 ST elevation (STEMI) myocardial infarction of unspecified site: Secondary | ICD-10-CM

## 2022-07-23 DIAGNOSIS — Z955 Presence of coronary angioplasty implant and graft: Secondary | ICD-10-CM

## 2022-07-24 NOTE — Progress Notes (Signed)
Cardiac Individual Treatment Plan  Patient Details  Name: Miguel Walker MRN: [id number] Date of Birth: 1941/05/26 Referring Provider:   Flowsheet Row INTENSIVE CARDIAC REHAB ORIENT from 05/15/2022 in [place]  Referring Provider Minus Breeding, MD       Initial Encounter Date:  [place] from 05/15/2022 in [facility]  Date 05/15/22       Visit Diagnosis: 02/05/22 STEMI  02/05/22 DES x3  Patient's Home Medications on Admission:  Current Outpatient Medications:    ascorbic acid (VITAMIN C) 500 MG tablet, Take 500 mg by mouth in the morning., Disp: , Rfl:    aspirin EC 81 MG tablet, Take 81 mg by mouth in the morning., Disp: , Rfl:    BRILINTA 90 MG TABS tablet, TAKE 1 TABLET BY MOUTH TWICE  DAILY (Patient taking differently: Take 90 mg by mouth 2 (two) times daily.), Disp: 180 tablet, Rfl: 3   colchicine 0.6 MG tablet, Take 1 tablet (0.6 mg total) by mouth 2 (two) times daily., Disp: 60 tablet, Rfl: 2   FARXIGA 10 MG TABS tablet, TAKE 1 TABLET BY MOUTH DAILY (Patient taking differently: Take 10 mg by mouth daily.), Disp: 90 tablet, Rfl: 3   hydrocortisone 2.5 % cream, Apply 1 application  topically 2 (two) times daily as needed (irritation.)., Disp: , Rfl:    metroNIDAZOLE (METROCREAM) 0.75 % cream, Apply 1 application  topically 2 (two) times daily as needed (skin rash)., Disp: , Rfl:    Multiple Vitamin (MULTI-VITAMIN) tablet, Take 1 tablet by mouth in the morning. Centrum Silver, Disp: , Rfl:    MYRBETRIQ 25 MG TB24 tablet, Take 25 mg by mouth in the morning., Disp: , Rfl:    nitroGLYCERIN (NITROSTAT) 0.4 MG SL tablet, Place 1 tablet (0.4 mg total) under the tongue every 5 (five) minutes as needed for chest pain., Disp: 25 tablet, Rfl: 3   Omega-3 1000 MG CAPS, Take 1,000 mg by mouth in the morning., Disp: , Rfl:    rosuvastatin (CRESTOR) 40 MG tablet, Take 1 tablet (40 mg total) by  mouth daily., Disp: 90 tablet, Rfl: 3   spironolactone (ALDACTONE) 25 MG tablet, Take 1 tablet (25 mg total) by mouth daily., Disp: 30 tablet, Rfl: 3   tolterodine (DETROL LA) 4 MG 24 hr capsule, Take 4 mg by mouth in the morning., Disp: , Rfl:    triamcinolone (KENALOG) 0.025 % cream, Apply 1 application  topically 2 (two) times daily as needed (skin irritation)., Disp: , Rfl:   Past Medical History: Past Medical History:  Diagnosis Date   Aneurysm, ascending aorta ([place])    Cancer ([place]) 1997   Prostate cancer   Coronary artery disease    Dyslipidemia    Family history of premature CAD    NO ISCHEMIC SYMPTOMS   Hyperlipidemia    Hypertension    Thyroid nodule     Tobacco Use: Social History   Tobacco Use  Smoking Status Never  Smokeless Tobacco Never    Labs: Review Flowsheet  More data may exist      Latest Ref Rng & Units 01/15/2020 08/22/2020 09/06/2021 02/05/2022 05/11/2022  Labs for ITP Cardiac and Pulmonary Rehab  Cholestrol 100 - 199 mg/dL 148  148  165  91  133   LDL (calc) 0 - 99 mg/dL 76  73  90  42  60   HDL-C >39 mg/dL 46  56  49  38  50   Trlycerides 0 - 149 mg/dL 152  104  151  55  134   Hemoglobin A1c 4.8 - 5.6 % - 5.6  5.7  5.4  -  TCO2 22 - 32 mmol/L - - - 25  -    Capillary Blood Glucose: Lab Results  Component Value Date   GLUCAP 117 (H) 02/05/2022     Exercise Target Goals: Exercise Program Goal: Individual exercise prescription set using results from initial 6 min walk test and THRR while considering  patient's activity barriers and safety.   Exercise Prescription Goal: Initial exercise prescription builds to 30-45 minutes a day of aerobic activity, 2-3 days per week.  Home exercise guidelines will be given to patient during program as part of exercise prescription that the participant will acknowledge.  Activity Barriers & Risk Stratification:  Activity Barriers & Cardiac Risk Stratification - 05/15/22 1012       Activity Barriers & Cardiac  Risk Stratification   Activity Barriers None    Cardiac Risk Stratification High             6 Minute Walk:  6 Minute Walk     Row Name 05/15/22 0958         6 Minute Walk   Phase Initial     Distance 1813 feet     Walk Time 6 minutes     # of Rest Breaks 0     MPH 3.43     METS 3.63     RPE 11     Perceived Dyspnea  0     VO2 Peak 12.7     Symptoms No     Resting HR 76 bpm     Resting BP 118/72     Resting Oxygen Saturation  97 %     Exercise Oxygen Saturation  during 6 min walk 99 %     Max Ex. HR 110 bpm     Max Ex. BP 148/72     2 Minute Post BP 118/72              Oxygen Initial Assessment:   Oxygen Re-Evaluation:   Oxygen Discharge (Final Oxygen Re-Evaluation):   Initial Exercise Prescription:  Initial Exercise Prescription - 05/15/22 1000       Date of Initial Exercise RX and Referring Provider   Date 05/15/22    Referring Provider Minus Breeding, MD    Expected Discharge Date 07/13/22      Treadmill   MPH 2.5    Grade 0    Minutes 15    METs 2.91      NuStep   Level 3    SPM 85    Minutes 15    METs 2.8      Prescription Details   Frequency (times per week) 3    Duration Progress to 30 minutes of continuous aerobic without signs/symptoms of physical distress      Intensity   THRR 40-80% of Max Heartrate 56-113    Ratings of Perceived Exertion 11-13    Perceived Dyspnea 0-4      Progression   Progression Continue to progress workloads to maintain intensity without signs/symptoms of physical distress.      Resistance Training   Training Prescription Yes    Weight 5 lbs    Reps 10-15             Perform Capillary Blood Glucose checks as needed.  Exercise Prescription Changes:   Exercise Prescription Changes  [place] Name 05/21/22 0851 06/11/22 0830 06/25/22 1155 07/02/22 0844 07/16/22 0852     Response to Exercise   Blood Pressure (Admit) 102/60 104/56 98/60 104/58 106/60   Blood Pressure (Exercise) 120/70  102/72 126/70 128/80 140/64   Blood Pressure (Exit) 110/60 1_0 98/60   Heart Rate (Admit) 83 bpm 81 bpm 93 bpm 95 bpm 87 bpm   Heart Rate (Exercise) 98 bpm 100 bpm 97 bpm 110 bpm 111 bpm   Heart Rate (Exit) 86 bpm 89 bpm 89 bpm 95 bpm 90 bpm   Rating of Perceived Exertion (Exercise) _1 12.5   Perceived Dyspnea (Exercise) 0 0 0 0 0   Symptoms 0 0 0 0 0   Comments Pt first day of exercise Reviewed MET's, goals and home ExRx Reviewed MET's Reviewed MET's and goals Reviewed MET's   Duration Progress to 30 minutes of  aerobic without signs/symptoms of physical distress Progress to 30 minutes of  aerobic without signs/symptoms of physical distress Progress to 30 minutes of  aerobic without signs/symptoms of physical distress Progress to 30 minutes of  aerobic without signs/symptoms of physical distress Progress to 30 minutes of  aerobic without signs/symptoms of physical distress   Intensity _2      Progression   Progression Continue to progress workloads to maintain intensity without signs/symptoms of physical distress. Continue to progress workloads to maintain intensity without signs/symptoms of physical distress. Continue to progress workloads to maintain intensity without signs/symptoms of physical distress. Continue to progress workloads to maintain intensity without signs/symptoms of physical distress. Continue to progress workloads to maintain intensity without signs/symptoms of physical distress.   Average METs 2.66 3 3.32 3.76 4.12     Resistance Training   Training Prescription _3    Weight 5 lbs 5 lbs 5 lbs 6 lbs wts 6 lbs wts   Reps 10-15 10-15 10-15 10-15 10-15   Time 10 Minutes 10 Minutes 10 Minutes 10 Minutes 10 Minutes     Treadmill   MPH 2.5 2.7 2.7 3.2 3.2   Grade 0 0 1 1.5 2   Minutes _4 METs 2.91 2.99 3.44 4.11 4.33     NuStep   Level _5 mins Level 6   SPM 85 95 95 95 95   Minutes _6 METs 2.4 3 3.2 3.4 3.9     Home Exercise Plan   Plans to continue exercise at -- Home (comment) Home (comment) Home (comment) Home (comment)   Frequency -- Add 3 additional days to program exercise sessions. Add 3 additional days to program exercise sessions. Add 3 additional days to program exercise sessions. Add 3 additional days to program exercise sessions.   Initial Home Exercises Provided -- 06/11/22 06/11/22 06/11/22 06/11/22            Exercise Comments:   Exercise Comments     Row Name 05/21/22 0856 06/11/22 0830 06/25/22 1158 07/02/22 0851 07/16/22 0854   Exercise Comments Pt first day in the CRP2 program. Pt tolerated exercise well with an average MET level of 2.66. Pt is learning his THRR, RPE and ExRx Reviewed MET's, goals and home Ex Rx. Pt tolerated exercise well with an average MET level of 3.0. Pt will continue to exercise on his own 3 days a week by golfing or walking 30+ minutes.  Pt is very happy about walking an average of 7500 steps per day for exercise. Pt feels he is making gradual progression for leg, and arm strength as well. Reviewed MET's. Pt tolerated exercise well with an average MET level of 3.32. Pt is feeling good about his exercise plan so far and had a smooth travel week last week to visit family. Reviewed MET's and goals. Pt tolerated exercise well with an average MET level of 3.76. Pt is feeling good about his goals and is getting 10,000 steps on 3+ days a week and is enjoying golf. Pt feels an increase in strength and is going to increase his wts for home use. Pt has a referral in for [facility] and would like to continue exercise there upon graduation. Reviewed MET's today with pt. Pt tolerated exercise well with an average MET level of 4.12. Pt is feeling good about his exercise so far and is continuing to progress workloads as tolerated.            Exercise Goals and Review:   Exercise  Goals     Row Name 05/15/22 0940             Exercise Goals   Increase Physical Activity Yes       Intervention Provide advice, education, support and counseling about physical activity/exercise needs.;Develop an individualized exercise prescription for aerobic and resistive training based on initial evaluation findings, risk stratification, comorbidities and participant's personal goals.       Expected Outcomes Short Term: Attend rehab on a regular basis to increase amount of physical activity.;Long Term: Exercising regularly at least 3-5 days a week.       Increase Strength and Stamina Yes       Intervention Provide advice, education, support and counseling about physical activity/exercise needs.;Develop an individualized exercise prescription for aerobic and resistive training based on initial evaluation findings, risk stratification, comorbidities and participant's personal goals.       Expected Outcomes Short Term: Increase workloads from initial exercise prescription for resistance, speed, and METs.;Short Term: Perform resistance training exercises routinely during rehab and add in resistance training at home;Long Term: Improve cardiorespiratory fitness, muscular endurance and strength as measured by increased METs and functional capacity (6MWT)       Able to understand and use rate of perceived exertion (RPE) scale Yes       Intervention Provide education and explanation on how to use RPE scale       Expected Outcomes Short Term: Able to use RPE daily in rehab to express subjective intensity level;Long Term:  Able to use RPE to guide intensity level when exercising independently       Knowledge and understanding of Target Heart Rate Range (THRR) Yes       Intervention Provide education and explanation of THRR including how the numbers were predicted and where they are located for reference       Expected Outcomes Short Term: Able to state/look up THRR;Long Term: Able to use THRR to govern  intensity when exercising independently;Short Term: Able to use daily as guideline for intensity in rehab       Able to check pulse independently Yes       Intervention Provide education and demonstration on how to check pulse in carotid and radial arteries.;Review the importance of being able to check your own pulse for safety during independent exercise       Expected Outcomes Short Term: Able to explain why pulse checking is important during  independent exercise;Long Term: Able to check pulse independently and accurately       Understanding of Exercise Prescription Yes       Intervention Provide education, explanation, and written materials on patient's individual exercise prescription       Expected Outcomes Short Term: Able to explain program exercise prescription;Long Term: Able to explain home exercise prescription to exercise independently                Exercise Goals Re-Evaluation :  Exercise Goals Re-Evaluation     Row Name 05/21/22 0854 06/11/22 0830 06/25/22 1157 07/02/22 0848       Exercise Goal Re-Evaluation   Exercise Goals Review Increase Physical Activity;Increase Strength and Stamina;Able to understand and use rate of perceived exertion (RPE) scale;Knowledge and understanding of Target Heart Rate Range (THRR);Understanding of Exercise Prescription Increase Physical Activity;Increase Strength and Stamina;Able to understand and use rate of perceived exertion (RPE) scale;Knowledge and understanding of Target Heart Rate Range (THRR);Understanding of Exercise Prescription Increase Physical Activity;Increase Strength and Stamina;Able to understand and use rate of perceived exertion (RPE) scale;Knowledge and understanding of Target Heart Rate Range (THRR);Understanding of Exercise Prescription Increase Physical Activity;Increase Strength and Stamina;Able to understand and use rate of perceived exertion (RPE) scale;Knowledge and understanding of Target Heart Rate Range  (THRR);Understanding of Exercise Prescription    Comments Pt first day in the CRP2 program. Pt tolerated exercise well with an average MET level of 2.66. Pt is learning his THRR, RPE and ExRx Reviewed MET's, goals and home Ex Rx. Pt tolerated exercise well with an average MET level of 3.0. Pt will continue to exercise on his own 3 days a week by golfing or walking 30+ minutes. Pt is very happy about walking an average of 7500 steps per day for exercise. Pt feels he is making gradual progression for leg, and arm strength as well. Reviewed MET's. Pt tolerated exercise well with an average MET level of 3.32. Pt is feeling good about his exercise plan so far and had a smooth travel week last week to visit family. Reviewed MET's and goals. Pt tolerated exercise well with an average MET level of 3.76. Pt is feeling good about his goals and is getting 10,000 steps on 3+ days a week and is enjoying golf. Pt feels an increase in strength and is going to increase his wts for home use. Pt has a referral in for [place] and would like to continue exercise there upon graduation.    Expected Outcomes Will continue to monitor pt and progress workloads as tolerated without sign or symptom Pt will continue to exercise on his own 3 days a week for 30+ mins per session. Will continue to monitor pt and progress workloads as tolerated without sign or symptom Will continue to monitor pt and progress workloads as tolerated without sign or symptom Will continue to monitor pt and progress workloads as tolerated without sign or symptom             Discharge Exercise Prescription (Final Exercise Prescription Changes):  Exercise Prescription Changes - 07/16/22 0852       Response to Exercise   Blood Pressure (Admit) 106/60    Blood Pressure (Exercise) 140/64    Blood Pressure (Exit) 98/60    Heart Rate (Admit) 87 bpm    Heart Rate (Exercise) 111 bpm    Heart Rate (Exit) 90 bpm    Rating of Perceived Exertion (Exercise)  12.5    Perceived Dyspnea (Exercise) 0    Symptoms  0    Comments Reviewed MET's    Duration Progress to 30 minutes of  aerobic without signs/symptoms of physical distress    Intensity THRR unchanged      Progression   Progression Continue to progress workloads to maintain intensity without signs/symptoms of physical distress.    Average METs 4.12      Resistance Training   Training Prescription Yes    Weight 6 lbs wts    Reps 10-15    Time 10 Minutes      Treadmill   MPH 3.2    Grade 2    Minutes 15    METs 4.33      NuStep   Level 5   10 mins Level 6   SPM 95    Minutes 15    METs 3.9      Home Exercise Plan   Plans to continue exercise at Home (comment)    Frequency Add 3 additional days to program exercise sessions.    Initial Home Exercises Provided 06/11/22             Nutrition:  Target Goals: Understanding of nutrition guidelines, daily intake of sodium <1561m, cholesterol <2012m calories 30% from fat and 7% or less from saturated fats, daily to have 5 or more servings of fruits and vegetables.  Biometrics:  Pre Biometrics - 05/15/22 0903       Pre Biometrics   Waist Circumference 36.5 inches    Hip Circumference 42 inches    Waist to Hip Ratio 0.87 %    Triceps Skinfold 16 mm    % Body Fat 26 %    Grip Strength 26 kg    Flexibility 9.5 in    Single Leg Stand 10.5 seconds              Nutrition Therapy Plan and Nutrition Goals:  Nutrition Therapy & Goals - 07/18/22 1021       Nutrition Therapy   Diet Heart Healthy Diet    Drug/Food Interactions Statins/Certain Fruits      Personal Nutrition Goals   Nutrition Goal Patient to choose a variety of fruits, vegetables, whole grains, lean protein/plant protein, nonfat dairy daily as part of a heart healthy diet.    Personal Goal #2 Patient to limit daily sodium intake to <150054m   Personal Goal #3 Patient to identify and limit daily food sources of saturated fat, trans fat, sodium, and  refined carbohydrates    Comments Goals in progress. Per diet recall, PatFraser Dinntinues reading food labels for sodium, increased non-starchy vegetables and mindfulness of portion sizes.      Intervention Plan   Intervention Prescribe, educate and counsel regarding individualized specific dietary modifications aiming towards targeted core components such as weight, hypertension, lipid management, diabetes, heart failure and other comorbidities.;Nutrition handout(s) given to patient.    Expected Outcomes Short Term Goal: Understand basic principles of dietary content, such as calories, fat, sodium, cholesterol and nutrients.;Short Term Goal: A plan has been developed with personal nutrition goals set during dietitian appointment.;Long Term Goal: Adherence to prescribed nutrition plan.             Nutrition Assessments:  Nutrition Assessments - 05/21/22 1103       Rate Your Plate Scores   Pre Score 80            MEDIFICTS Score Key: ?70 Need to make dietary changes  40-70 Heart Healthy Diet ? 40 Therapeutic Level Cholesterol Diet   Flowsheet  Row INTENSIVE CARDIAC REHAB from 05/21/2022 in [place]  Picture Your Plate Total Score on Admission 80      Picture Your Plate Scores: <62 Unhealthy dietary pattern with much room for improvement. 41-50 Dietary pattern unlikely to meet recommendations for good health and room for improvement. 51-60 More healthful dietary pattern, with some room for improvement.  >60 Healthy dietary pattern, although there may be some specific behaviors that could be improved.    Nutrition Goals Re-Evaluation:  Nutrition Goals Re-Evaluation     [place] Name 06/15/22 1036 07/18/22 1021           Goals   Current Weight 182 lb 5.1 oz (82.7 kg)  weight from 06/13/2022 184 lb 1.4 oz (83.5 kg)      Comment -- No new labs at this time. Weight stable.      Expected Outcome Goals in progress. Patient's recent attendance has been  somewhat variable; however, he remains engaged in the [facility] education classes and reports following a heart healthy diet. Dietary changes consistent with Lipid panel and A1c WNL plus Weight loss of ~24# since L Heart Cath 02/05/22. He plans to maintain weight at this point. Goals in progress. Per diet recall, Fraser Din continues reading food labels for sodium, increased non-starchy vegetables and mindfulness of portion sizes. He continues to follow strategies from the [place] diet and pritikin eating plan.               Nutrition Goals Re-Evaluation:  Nutrition Goals Re-Evaluation     [place] Name 06/15/22 1036 07/18/22 1021           Goals   Current Weight 182 lb 5.1 oz (82.7 kg)  weight from 06/13/2022 184 lb 1.4 oz (83.5 kg)      Comment -- No new labs at this time. Weight stable.      Expected Outcome Goals in progress. Patient's recent attendance has been somewhat variable; however, he remains engaged in the [employer] education classes and reports following a heart healthy diet. Dietary changes consistent with Lipid panel and A1c WNL plus Weight loss of ~24# since L Heart Cath 02/05/22. He plans to maintain weight at this point. Goals in progress. Per diet recall, Fraser Din continues reading food labels for sodium, increased non-starchy vegetables and mindfulness of portion sizes. He continues to follow strategies from the [place] diet and pritikin eating plan.               Nutrition Goals Discharge (Final Nutrition Goals Re-Evaluation):  Nutrition Goals Re-Evaluation - 07/18/22 1021       Goals   Current Weight 184 lb 1.4 oz (83.5 kg)    Comment No new labs at this time. Weight stable.    Expected Outcome Goals in progress. Per diet recall, Fraser Din continues reading food labels for sodium, increased non-starchy vegetables and mindfulness of portion sizes. He continues to follow strategies from the [place] diet and pritikin eating plan.             Psychosocial: Target  Goals: Acknowledge presence or absence of significant depression and/or stress, maximize coping skills, provide positive support system. Participant is able to verbalize types and ability to use techniques and skills needed for reducing stress and depression.  Initial Review & Psychosocial Screening:  Initial Psych Review & Screening - 05/15/22 1606       Initial Review   Current issues with Current Stress Concerns    Source of Stress Concerns Family  Comments Fraser Din has a disabled son who lives in [place].      Family Dynamics   Good Support System? Yes   Fraser Din has his wife and sons for support            Quality of Life Scores:  Quality of Life - 05/15/22 1529       Quality of Life   Select Quality of Life      Quality of Life Scores   Health/Function Pre 24.47 %    Socioeconomic Pre 27.21 %    Psych/Spiritual Pre 26.57 %    Family Pre 23.8 %    GLOBAL Pre 25.37 %            Scores of 19 and below usually indicate a poorer quality of life in these areas.  A difference of  2-3 points is a clinically meaningful difference.  A difference of 2-3 points in the total score of the Quality of Life Index has been associated with significant improvement in overall quality of life, self-image, physical symptoms, and general health in studies assessing change in quality of life.  PHQ-9: Review Flowsheet       05/15/2022  Depression screen PHQ 2/9  Decreased Interest 0  Down, Depressed, Hopeless 0  PHQ - 2 Score 0   Interpretation of Total Score  Total Score Depression Severity:  1-4 = Minimal depression, 5-9 = Mild depression, 10-14 = Moderate depression, 15-19 = Moderately severe depression, 20-27 = Severe depression   Psychosocial Evaluation and Intervention:  Psychosocial Evaluation - 05/21/22 0741       Psychosocial Evaluation & Interventions   Interventions Encouraged to exercise with the program and follow exercise prescription;Stress management education     Comments Fraser Din denies any psychosocial barriers to participating in Cardiac Rehab.  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 Fraser Din does rate his stress level as medium due to adult aged son who has a disability.    Expected Outcomes Pt will contine to deny any barriers to adopting heart healthy lifestyle. Fraser Din will utilize stress management tools he will learn to cope positive and healthy.    Continue Psychosocial Services  Follow up required by staff             Psychosocial Re-Evaluation:  Psychosocial Re-Evaluation     [place] Name 05/25/22 1200 06/26/22 1516 07/25/22 1253         Psychosocial Re-Evaluation   Current issues with Current Stress Concerns Current Stress Concerns Current Stress Concerns     Comments Fraser Din is off to a wonderful start with positive interaction with staff and fellow participants.  Fraser Din has completed 1 healthy mind set workshop and felt he learned techniques he can use during stressful events with attending to his adult son who has a disability Pat returned from family vacation in [place].  Fraser Din had a wonderful time and felt this was what he needed and this has helped with his stress level.  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  Pat visits his son often at [facility] and feels he is thriving well.     Expected Outcomes Fraser Din will display positive and healthy stress management skill  by incorporating them in every day life Fraser Din will display positive and healthy stress management skill by incorporating them in every day life Fraser Din will display positive and healthy stress management skill by incorporating them in every day life     Interventions Stress management education;Encouraged to attend Cardiac Rehabilitation for the exercise  Stress management education;Encouraged to attend Cardiac Rehabilitation for the exercise Stress management education;Relaxation education;Encouraged to attend Cardiac Rehabilitation for the exercise     Continue Psychosocial Services  No Follow up required No Follow up required No Follow up required              Psychosocial Discharge (Final Psychosocial Re-Evaluation):  Psychosocial Re-Evaluation - 07/25/22 1253       Psychosocial Re-Evaluation   Current issues with Current Stress Concerns    Comments Pa who has completed 22 exercise sessions t retuning from a family trip to the beach and he had a lovely time and noted that he was able to ambulate with no issues.  Pat visits his son often at [facility] and feels he is thriving well.    Expected Outcomes Fraser Din will display positive and healthy stress management skill by incorporating them in every day life    Interventions Stress management education;Relaxation education;Encouraged to attend Cardiac Rehabilitation for the exercise    Continue Psychosocial Services  No Follow up required             Vocational Rehabilitation: Provide vocational rehab assistance to qualifying candidates.   Vocational Rehab Evaluation & Intervention:  Vocational Rehab - 05/15/22 1611       Initial Vocational Rehab Evaluation & Intervention   Assessment shows need for Vocational Rehabilitation No   Fraser Din is retired and does not need vocational rehab at this time            Education: Education Goals: Education classes will be provided on a weekly basis, covering required topics. Participant will state understanding/return demonstration of topics presented.    Education     Row Name 05/21/22 0900     Education   Cardiac Education Topics Pritikin   Select Workshops     Workshops   Educator Exercise Physiologist   Select Psychosocial   Psychosocial Workshop Recognizing and Reducing Stress   Instruction Review Code 1- Verbalizes  Understanding   Class Start Time [phone]   Class Stop Time 0858   Class Time Calculation (min) 46 min    [place] Name 05/23/22 0800     Education   Cardiac Education Topics [place]   Educator Dietitian   Weekly Topic Fast and Healthy Breakfasts   Instruction Review Code 1- Verbalizes Understanding   Class Start Time [phone]   Class Stop Time 0846   Class Time Calculation (min) 38 min    [place] Name 05/25/22 0900     Education   Cardiac Education Topics Pritikin   Select Core Videos     Core Videos   Educator Nurse   Select Nutrition   Nutrition Overview of the Pritikin Eating Plan   Instruction Review Code 1- Verbalizes Understanding   Class Start Time 0810   Class Stop Time 0850   Class Time Calculation (min) 40 min    [place] Name 05/28/22 0900     Education   Cardiac Education Topics Pritikin   Environmental consultant Exercise   Exercise Workshop Balance  Training and Fall Prevention   Instruction Review Code 1- Verbalizes Understanding   Class Start Time [phone]   Class Stop Time 0847   Class Time Calculation (min) 40 min    [place] Name 05/30/22 0900     Education   Cardiac Education Topics [employer]   Educator Dietitian   Weekly Topic Satisfying Salads and Dressings   Instruction Review Code 1- Verbalizes Understanding   Class Start Time 0813   Class Stop Time 0900   Class Time Calculation (min) 47 min    [place] Name 06/08/22 1000     Education   Cardiac Education Topics Pritikin   Select Core Videos     Core Videos   Educator Dietitian   Select Nutrition   Nutrition Other   Instruction Review Code 1- Verbalizes Understanding   Class Start Time [phone]   Class Stop Time 0852   Class Time Calculation (min) 41 min    [place] Name 06/11/22 0900     Education   Cardiac Education Topics Pritikin   Select Workshops     Workshops    Educator Exercise Physiologist   Select Psychosocial   Psychosocial Workshop Managing Moods and Relationships   Instruction Review Code 1- Verbalizes Understanding   Class Start Time [phone]   Class Stop Time 0855   Class Time Calculation (min) 43 min    [place] Name 06/13/22 1000     Education   Cardiac Education Topics [employer]   Educator Dietitian   Weekly Topic Delicious Desserts   Instruction Review Code 1- Verbalizes Understanding   Class Start Time 0815   Class Stop Time 0900   Class Time Calculation (min) 45 min    [place] Name 06/25/22 0900     Education   Cardiac Education Topics Pritikin   Select Core Videos     Core Videos   Educator Exercise Physiologist   Select Nutrition   Nutrition Nutrition Action Plan   Instruction Review Code 1- Verbalizes Understanding   Class Start Time [phone]   Class Stop Time 0855   Class Time Calculation (min) 43 min    [place] Name 06/29/22 0900     Education   Cardiac Education Topics Pritikin   Select Core Videos     Core Videos   Educator Nurse   Select General Education   General Education Hypertension and Heart Disease   Instruction Review Code 1- Verbalizes Understanding   Class Start Time [phone]   Class Stop Time 0900   Class Time Calculation (min) 46 min    [place] Name 07/02/22 0800     Education   Cardiac Education Topics Pritikin   Select Workshops     Core Videos   Educator --   Select --   Psychosocial --   Instruction Review Code --   Class Start Time --   Class Stop Time --   Class Time Calculation (min) --     Workshops   Dentist Psychosocial   Psychosocial Workshop New Thoughts, New Behaviors   Instruction Review Code 1- Verbalizes Understanding   Class Start Time [phone]   Class Stop Time 0848   Class Time Calculation (min) 36 min    [place] Name 07/04/22 1100     Education   Cardiac Education Topics [employer]   Agricultural consultant   Weekly Topic One-Pot Wonders   Instruction Review Code 1- Verbalizes Understanding   Class Start Time 0815   Class Stop Time 0900   Class Time Calculation (min) 45 min    [place] Name 07/06/22 1000     Education   Cardiac Education Topics Pritikin   Select Core Videos     Core Videos   Educator Dietitian   Select Nutrition   Nutrition Dining Out - Part 1   Instruction Review Code 1- Verbalizes Understanding   Class Start Time [phone]   Class Stop Time 0857   Class Time Calculation (min) 43 min    [place] Name 07/09/22 0900     Education   Cardiac Education Topics Pritikin   Select Core Videos     Core Videos   Educator Exercise Physiologist   Select Exercise Education   Exercise Education Biomechanial Limitations   Instruction Review Code 1- Verbalizes Understanding   Class Start Time 0810   Class Stop Time [phone]   Class Time Calculation (min) 45 min    [place] Name 07/11/22 1100     Education   Cardiac Education Topics [employer]   Educator Dietitian   Weekly Topic Fast Evening Meals   Instruction Review Code 1- Verbalizes Understanding   Class Start Time 0815   Class Stop Time 0904   Class Time Calculation (min) 49 min    [place] Name 07/13/22 0900     Education   Cardiac Education Topics Pritikin   Select Core Videos     Core Videos   Educator Nurse   Select Psychosocial   Psychosocial How Our Thoughts Can Heal Our Hearts   Instruction Review Code 1- Verbalizes Understanding   Class Start Time 0815   Class Stop Time [phone]   Class Time Calculation (min) 40 min    [place] Name 07/16/22 0900     Education   Cardiac Education Topics Pritikin   IT sales professional Nutrition   Nutrition Workshop Fueling a Designer, multimedia   Instruction Review Code 1- Verbalizes Understanding   Class Start Time 0815   Class Stop Time 0904   Class Time Calculation  (min) 49 min    [place] Name 07/18/22 0900     Education   Cardiac Education Topics [employer]   Educator Dietitian   Weekly Topic Adding Flavor - Sodium-Free   Instruction Review Code 1- Verbalizes Understanding   Class Start Time 0815   Class Stop Time 1517   Class Time Calculation (min) 42 min    [place] Name 07/23/22 0900     Education   Cardiac Education Topics Pritikin   Select Workshops     Workshops   Educator Exercise Physiologist   Select Psychosocial   Psychosocial Workshop Recognizing and Reducing Stress   Instruction Review Code 1- Verbalizes Understanding   Class Start Time 0810   Class Stop Time 0904   Class Time Calculation (min) 54 min    [place] Name 07/25/22 0900     Education   Cardiac Education Topics Pritikin   Financial trader   Weekly Topic Fast and Healthy Breakfasts   Instruction Review Code 1- Verbalizes Understanding   Class Start Time 0815   Class Stop  Time 0905   Class Time Calculation (min) 50 min            Core Videos: Exercise    Move It!  Clinical staff conducted group or individual video education with verbal and written material and guidebook.  Patient learns the recommended Pritikin exercise program. Exercise with the goal of living a long, healthy life. Some of the health benefits of exercise include controlled diabetes, healthier blood pressure levels, improved cholesterol levels, improved heart and lung capacity, improved sleep, and better body composition. Everyone should speak with their doctor before starting or changing an exercise routine.  Biomechanical Limitations Clinical staff conducted group or individual video education with verbal and written material and guidebook.  Patient learns how biomechanical limitations can impact exercise and how we can mitigate and possibly overcome limitations to have an impactful and balanced exercise  routine.  Body Composition Clinical staff conducted group or individual video education with verbal and written material and guidebook.  Patient learns that body composition (ratio of muscle mass to fat mass) is a key component to assessing overall fitness, rather than body weight alone. Increased fat mass, especially visceral belly fat, can put [place] at increased risk for metabolic syndrome, type 2 diabetes, heart disease, and even death. It is recommended to combine diet and exercise (cardiovascular and resistance training) to improve your body composition. Seek guidance from your physician and exercise physiologist before implementing an exercise routine.  Exercise Action Plan Clinical staff conducted group or individual video education with verbal and written material and guidebook.  Patient learns the recommended strategies to achieve and enjoy long-term exercise adherence, including variety, self-motivation, self-efficacy, and positive decision making. Benefits of exercise include fitness, good health, weight management, more energy, better sleep, less stress, and overall well-being.  Medical   Heart Disease Risk Reduction Clinical staff conducted group or individual video education with verbal and written material and guidebook.  Patient learns our heart is our most vital organ as it circulates oxygen, nutrients, white blood cells, and hormones throughout the entire body, and carries waste away. Data supports a plant-based eating plan like the Pritikin Program for its effectiveness in slowing progression of and reversing heart disease. The video provides a number of recommendations to address heart disease.   Metabolic Syndrome and Belly Fat  Clinical staff conducted group or individual video education with verbal and written material and guidebook.  Patient learns what metabolic syndrome is, how it leads to heart disease, and how one can reverse it and keep it from coming back. You have  metabolic syndrome if you have 3 of the following 5 criteria: abdominal obesity, high blood pressure, high triglycerides, low HDL cholesterol, and high blood sugar.  Hypertension and Heart Disease Clinical staff conducted group or individual video education with verbal and written material and guidebook.  Patient learns that high blood pressure, or hypertension, is very common in the [place]. Hypertension is largely due to excessive salt intake, but other important risk factors include being overweight, physical inactivity, drinking too much alcohol, smoking, and not eating enough potassium from fruits and vegetables. High blood pressure is a leading risk factor for heart attack, stroke, congestive heart failure, dementia, kidney failure, and premature death. Long-term effects of excessive salt intake include stiffening of the arteries and thickening of heart muscle and organ damage. Recommendations include ways to reduce hypertension and the risk of heart disease.  Diseases of Our Time - Focusing on Diabetes Clinical staff conducted group or individual video  education with verbal and written material and guidebook.  Patient learns why the best way to stop diseases of our time is prevention, through food and other lifestyle changes. Medicine (such as prescription pills and surgeries) is often only a Band-Aid on the problem, not a long-term solution. Most common diseases of our time include obesity, type 2 diabetes, hypertension, heart disease, and cancer. The Pritikin Program is recommended and has been proven to help reduce, reverse, and/or prevent the damaging effects of metabolic syndrome.  Nutrition   Overview of the Pritikin Eating Plan  Clinical staff conducted group or individual video education with verbal and written material and guidebook.  Patient learns about the [place] for disease risk reduction. The [place] emphasizes a wide variety of unrefined,  minimally-processed carbohydrates, like fruits, vegetables, whole grains, and legumes. Go, Caution, and Stop food choices are explained. Plant-based and lean animal proteins are emphasized. Rationale provided for low sodium intake for blood pressure control, low added sugars for blood sugar stabilization, and low added fats and oils for coronary artery disease risk reduction and weight management.  Calorie Density  Clinical staff conducted group or individual video education with verbal and written material and guidebook.  Patient learns about calorie density and how it impacts the Pritikin Eating Plan. Knowing the characteristics of the food you choose will help you decide whether those foods will lead to weight gain or weight loss, and whether you want to consume more or less of them. Weight loss is usually a side effect of the Pritikin Eating Plan because of its focus on low calorie-dense foods.  Label Reading  Clinical staff conducted group or individual video education with verbal and written material and guidebook.  Patient learns about the Pritikin recommended label reading guidelines and corresponding recommendations regarding calorie density, added sugars, sodium content, and whole grains.  Dining Out - Part 1  Clinical staff conducted group or individual video education with verbal and written material and guidebook.  Patient learns that restaurant meals can be sabotaging because they can be so high in calories, fat, sodium, and/or sugar. Patient learns recommended strategies on how to positively address this and avoid unhealthy pitfalls.  Facts on Fats  Clinical staff conducted group or individual video education with verbal and written material and guidebook.  Patient learns that lifestyle modifications can be just as effective, if not more so, as many medications for lowering your risk of heart disease. A Pritikin lifestyle can help to reduce your risk of inflammation and atherosclerosis  (cholesterol build-up, or plaque, in the artery walls). Lifestyle interventions such as dietary choices and physical activity address the cause of atherosclerosis. A review of the types of fats and their impact on blood cholesterol levels, along with dietary recommendations to reduce fat intake is also included.  Nutrition Action Plan  Clinical staff conducted group or individual video education with verbal and written material and guidebook.  Patient learns how to incorporate Pritikin recommendations into their lifestyle. Recommendations include planning and keeping personal health goals in mind as an important part of their success.  Healthy Mind-Set    Healthy Minds, Bodies, Hearts  Clinical staff conducted group or individual video education with verbal and written material and guidebook.  Patient learns how to identify when they are stressed. Video will discuss the impact of that stress, as well as the many benefits of stress management. Patient will also be introduced to stress management techniques. The way we think, act, and feel has  an impact on our hearts.  How Our Thoughts Can Heal Our Hearts  Clinical staff conducted group or individual video education with verbal and written material and guidebook.  Patient learns that negative thoughts can cause depression and anxiety. This can result in negative lifestyle behavior and serious health problems. Cognitive behavioral therapy is an effective method to help control our thoughts in order to change and improve our emotional outlook.  Additional Videos:  Exercise    Improving Performance  Clinical staff conducted group or individual video education with verbal and written material and guidebook.  Patient learns to use a non-linear approach by alternating intensity levels and lengths of time spent exercising to help burn more calories and lose more body fat. Cardiovascular exercise helps improve heart health, metabolism, hormonal balance,  blood sugar control, and recovery from fatigue. Resistance training improves strength, endurance, balance, coordination, reaction time, metabolism, and muscle mass. Flexibility exercise improves circulation, posture, and balance. Seek guidance from your physician and exercise physiologist before implementing an exercise routine and learn your capabilities and proper form for all exercise.  Introduction to Yoga  Clinical staff conducted group or individual video education with verbal and written material and guidebook.  Patient learns about yoga, a discipline of the coming together of mind, breath, and body. The benefits of yoga include improved flexibility, improved range of motion, better posture and core strength, increased lung function, weight loss, and positive self-image. Yoga's heart health benefits include lowered blood pressure, healthier heart rate, decreased cholesterol and triglyceride levels, improved immune function, and reduced stress. Seek guidance from your physician and exercise physiologist before implementing an exercise routine and learn your capabilities and proper form for all exercise.  Medical   Aging: Enhancing Your Quality of Life  Clinical staff conducted group or individual video education with verbal and written material and guidebook.  Patient learns key strategies and recommendations to stay in good physical health and enhance quality of life, such as prevention strategies, having an advocate, securing a [place], and keeping a list of medications and system for tracking them. It also discusses how to avoid risk for bone loss.  Biology of Weight Control  Clinical staff conducted group or individual video education with verbal and written material and guidebook.  Patient learns that weight gain occurs because we consume more calories than we burn (eating more, moving less). Even if your body weight is normal, you may have higher ratios of fat  compared to muscle mass. Too much body fat puts you at increased risk for cardiovascular disease, heart attack, stroke, type 2 diabetes, and obesity-related cancers. In addition to exercise, following the [place] can help reduce your risk.  Decoding Lab Results  Clinical staff conducted group or individual video education with verbal and written material and guidebook.  Patient learns that lab test reflects one measurement whose values change over time and are influenced by many factors, including medication, stress, sleep, exercise, food, hydration, pre-existing medical conditions, and more. It is recommended to use the knowledge from this video to become more involved with your lab results and evaluate your numbers to speak with your doctor.   Diseases of Our Time - Overview  Clinical staff conducted group or individual video education with verbal and written material and guidebook.  Patient learns that according to the CDC, 50% to 70% of chronic diseases (such as obesity, type 2 diabetes, elevated lipids, hypertension, and heart disease) are avoidable through lifestyle improvements  including healthier food choices, listening to satiety cues, and increased physical activity.  Sleep Disorders Clinical staff conducted group or individual video education with verbal and written material and guidebook.  Patient learns how good quality and duration of sleep are important to overall health and well-being. Patient also learns about sleep disorders and how they impact health along with recommendations to address them, including discussing with a physician.  Nutrition  Dining Out - Part 2 Clinical staff conducted group or individual video education with verbal and written material and guidebook.  Patient learns how to plan ahead and communicate in order to maximize their dining experience in a healthy and nutritious manner. Included are recommended food choices based on the type of restaurant  the patient is visiting.   Fueling a Best boy conducted group or individual video education with verbal and written material and guidebook.  There is a strong connection between our food choices and our health. Diseases like obesity and type 2 diabetes are very prevalent and are in large-part due to lifestyle choices. The Pritikin Eating Plan provides plenty of food and hunger-curbing satisfaction. It is easy to follow, affordable, and helps reduce health risks.  Menu Workshop  Clinical staff conducted group or individual video education with verbal and written material and guidebook.  Patient learns that restaurant meals can sabotage health goals because they are often packed with calories, fat, sodium, and sugar. Recommendations include strategies to plan ahead and to communicate with the manager, chef, or server to help order a healthier meal.  Planning Your Eating Strategy  Clinical staff conducted group or individual video education with verbal and written material and guidebook.  Patient learns about the [place] and its benefit of reducing the risk of disease. The [place] does not focus on calories. Instead, it emphasizes high-quality, nutrient-rich foods. By knowing the characteristics of the foods, we choose, we can determine their calorie density and make informed decisions.  Targeting Your Nutrition Priorities  Clinical staff conducted group or individual video education with verbal and written material and guidebook.  Patient learns that lifestyle habits have a tremendous impact on disease risk and progression. This video provides eating and physical activity recommendations based on your personal health goals, such as reducing LDL cholesterol, losing weight, preventing or controlling type 2 diabetes, and reducing high blood pressure.  Vitamins and Minerals  Clinical staff conducted group or individual video education with verbal and written  material and guidebook.  Patient learns different ways to obtain key vitamins and minerals, including through a recommended healthy diet. It is important to discuss all supplements you take with your doctor.   Healthy Mind-Set    Smoking Cessation  Clinical staff conducted group or individual video education with verbal and written material and guidebook.  Patient learns that cigarette smoking and tobacco addiction pose a serious health risk which affects millions of people. Stopping smoking will significantly reduce the risk of heart disease, lung disease, and many forms of cancer. Recommended strategies for quitting are covered, including working with your doctor to develop a successful plan.  Culinary   Becoming a Financial trader conducted group or individual video education with verbal and written material and guidebook.  Patient learns that cooking at home can be healthy, cost-effective, quick, and puts them in control. Keys to cooking healthy recipes will include looking at your recipe, assessing your equipment needs, planning ahead, making it simple, choosing cost-effective seasonal ingredients, and limiting  the use of added fats, salts, and sugars.  Cooking - Breakfast and Snacks  Clinical staff conducted group or individual video education with verbal and written material and guidebook.  Patient learns how important breakfast is to satiety and nutrition through the entire day. Recommendations include key foods to eat during breakfast to help stabilize blood sugar levels and to prevent overeating at meals later in the day. Planning ahead is also a key component.  Cooking - Human resources officer conducted group or individual video education with verbal and written material and guidebook.  Patient learns eating strategies to improve overall health, including an approach to cook more at home. Recommendations include thinking of animal protein as a side on your plate  rather than center stage and focusing instead on lower calorie dense options like vegetables, fruits, whole grains, and plant-based proteins, such as beans. Making sauces in large quantities to freeze for later and leaving the skin on your vegetables are also recommended to maximize your experience.  Cooking - Healthy Salads and Dressing Clinical staff conducted group or individual video education with verbal and written material and guidebook.  Patient learns that vegetables, fruits, whole grains, and legumes are the foundations of the [place]. Recommendations include how to incorporate each of these in flavorful and healthy salads, and how to create homemade salad dressings. Proper handling of ingredients is also covered. Cooking - Soups and [employer] - Soups and Desserts Clinical staff conducted group or individual video education with verbal and written material and guidebook.  Patient learns that Pritikin soups and desserts make for easy, nutritious, and delicious snacks and meal components that are low in sodium, fat, sugar, and calorie density, while high in vitamins, minerals, and filling fiber. Recommendations include simple and healthy ideas for soups and desserts.   Overview     The Pritikin Solution Program Overview Clinical staff conducted group or individual video education with verbal and written material and guidebook.  Patient learns that the results of the [place] Program have been documented in more than 100 articles published in peer-reviewed journals, and the benefits include reducing risk factors for (and, in some cases, even reversing) high cholesterol, high blood pressure, type 2 diabetes, obesity, and more! An overview of the three key pillars of the Pritikin Program will be covered: eating well, doing regular exercise, and having a healthy mind-set.  WORKSHOPS  Exercise: Exercise Basics: Building Your Action Plan Clinical staff led group instruction  and group discussion with PowerPoint presentation and patient guidebook. To enhance the learning environment the use of posters, models and videos may be added. At the conclusion of this workshop, patients will comprehend the difference between physical activity and exercise, as well as the benefits of incorporating both, into their routine. Patients will understand the FITT (Frequency, Intensity, Time, and Type) principle and how to use it to build an exercise action plan. In addition, safety concerns and other considerations for exercise and cardiac rehab will be addressed by the presenter. The purpose of this lesson is to promote a comprehensive and effective weekly exercise routine in order to improve patients' overall level of fitness.   Managing Heart Disease: Your Path to a Healthier Heart Clinical staff led group instruction and group discussion with PowerPoint presentation and patient guidebook. To enhance the learning environment the use of posters, models and videos may be added.At the conclusion of this workshop, patients will understand the anatomy and physiology of the heart. Additionally, they will  understand how Pritikin's three pillars impact the risk factors, the progression, and the management of heart disease.  The purpose of this lesson is to provide a high-level overview of the heart, heart disease, and how the Pritikin lifestyle positively impacts risk factors.  Exercise Biomechanics Clinical staff led group instruction and group discussion with PowerPoint presentation and patient guidebook. To enhance the learning environment the use of posters, models and videos may be added. Patients will learn how the structural parts of their bodies function and how these functions impact their daily activities, movement, and exercise. Patients will learn how to promote a neutral spine, learn how to manage pain, and identify ways to improve their physical movement in order to  promote healthy living. The purpose of this lesson is to expose patients to common physical limitations that impact physical activity. Participants will learn practical ways to adapt and manage aches and pains, and to minimize their effect on regular exercise. Patients will learn how to maintain good posture while sitting, walking, and lifting.  Balance Training and Fall Prevention  Clinical staff led group instruction and group discussion with PowerPoint presentation and patient guidebook. To enhance the learning environment the use of posters, models and videos may be added. At the conclusion of this workshop, patients will understand the importance of their sensorimotor skills (vision, proprioception, and the vestibular system) in maintaining their ability to balance as they age. Patients will apply a variety of balancing exercises that are appropriate for their current level of function. Patients will understand the common causes for poor balance, possible solutions to these problems, and ways to modify their physical environment in order to minimize their fall risk. The purpose of this lesson is to teach patients about the importance of maintaining balance as they age and ways to minimize their risk of falling.  WORKSHOPS   Nutrition:  Fueling a Scientist, research (physical sciences) led group instruction and group discussion with PowerPoint presentation and patient guidebook. To enhance the learning environment the use of posters, models and videos may be added. Patients will review the foundational principles of the [place] and understand what constitutes a serving size in each of the food groups. Patients will also learn Pritikin-friendly foods that are better choices when away from home and review make-ahead meal and snack options. Calorie density will be reviewed and applied to three nutrition priorities: weight maintenance, weight loss, and weight gain. The purpose of this lesson is to  reinforce (in a group setting) the key concepts around what patients are recommended to eat and how to apply these guidelines when away from home by planning and selecting Pritikin-friendly options. Patients will understand how calorie density may be adjusted for different weight management goals.  Mindful Eating  Clinical staff led group instruction and group discussion with PowerPoint presentation and patient guidebook. To enhance the learning environment the use of posters, models and videos may be added. Patients will briefly review the concepts of the [place] and the importance of low-calorie dense foods. The concept of mindful eating will be introduced as well as the importance of paying attention to internal hunger signals. Triggers for non-hunger eating and techniques for dealing with triggers will be explored. The purpose of this lesson is to provide patients with the opportunity to review the basic principles of the [place], discuss the value of eating mindfully and how to measure internal cues of hunger and fullness using the Hunger Scale. Patients will also discuss reasons for  non-hunger eating and learn strategies to use for controlling emotional eating.  Targeting Your Nutrition Priorities Clinical staff led group instruction and group discussion with PowerPoint presentation and patient guidebook. To enhance the learning environment the use of posters, models and videos may be added. Patients will learn how to determine their genetic susceptibility to disease by reviewing their family history. Patients will gain insight into the importance of diet as part of an overall healthy lifestyle in mitigating the impact of genetics and other environmental insults. The purpose of this lesson is to provide patients with the opportunity to assess their personal nutrition priorities by looking at their family history, their own health history and current risk factors. Patients will  also be able to discuss ways of prioritizing and modifying the [place] for their highest risk areas  Menu  Clinical staff led group instruction and group discussion with PowerPoint presentation and patient guidebook. To enhance the learning environment the use of posters, models and videos may be added. Using menus brought in from [employer], or printed from [employer], patients will apply the [place] dining out guidelines that were presented in the [employer] video. Patients will also be able to practice these guidelines in a variety of provided scenarios. The purpose of this lesson is to provide patients with the opportunity to practice hands-on learning of the [place] with actual menus and practice scenarios.  Label Reading Clinical staff led group instruction and group discussion with PowerPoint presentation and patient guidebook. To enhance the learning environment the use of posters, models and videos may be added. Patients will review and discuss the Pritikin label reading guidelines presented in Pritikin's Label Reading Educational series video. Using fool labels brought in from local grocery stores and markets, patients will apply the label reading guidelines and determine if the packaged food meet the Pritikin guidelines. The purpose of this lesson is to provide patients with the opportunity to review, discuss, and practice hands-on learning of the Pritikin Label Reading guidelines with actual packaged food labels. [place] Workshops are designed to teach patients ways to prepare quick, simple, and affordable recipes at home. The importance of nutrition's role in chronic disease risk reduction is reflected in its emphasis in the overall Pritikin program. By learning how to prepare essential core Pritikin Eating Plan recipes, patients will increase control over what they eat; be able to customize the  flavor of foods without the use of added salt, sugar, or fat; and improve the quality of the food they consume. By learning a set of core recipes which are easily assembled, quickly prepared, and affordable, patients are more likely to prepare more healthy foods at home. These workshops focus on convenient breakfasts, simple entres, side dishes, and desserts which can be prepared with minimal effort and are consistent with nutrition recommendations for cardiovascular risk reduction. Cooking [employer] are taught by a Engineer, materials (RD) who has been trained by the [employer]. The chef or RD has a clear understanding of the importance of minimizing - if not completely eliminating - added fat, sugar, and sodium in recipes. Throughout the series of [place] Workshop sessions, patients will learn about healthy ingredients and efficient methods of cooking to build confidence in their capability to prepare    Cooking School weekly topics:  Adding Flavor- Sodium-Free  Fast and Healthy Breakfasts  Powerhouse Plant-Based Proteins  Satisfying Salads and Dressings  Simple Sides  and Sauces  International Cuisine-Spotlight on the [place] Zones  Delicious Desserts  Savory Soups  [employer] - Meals in a Snap  Tasty Appetizers and Snacks  Comforting Weekend Breakfasts  One-Pot Wonders   Fast Evening Meals  Contractor Your Pritikin Plate  WORKSHOPS   Healthy Mindset (Psychosocial): New Thoughts, New Behaviors Clinical staff led group instruction and group discussion with PowerPoint presentation and patient guidebook. To enhance the learning environment the use of posters, models and videos may be added. Patients will learn and practice techniques for developing effective health and lifestyle goals. Patients will be able to effectively apply the goal setting process learned to develop at least one new personal goal.  The purpose of this  lesson is to expose patients to a new skill set of behavior modification techniques such as techniques setting SMART goals, overcoming barriers, and achieving new thoughts and new behaviors.  Managing Moods and Relationships Clinical staff led group instruction and group discussion with PowerPoint presentation and patient guidebook. To enhance the learning environment the use of posters, models and videos may be added. Patients will learn how emotional and chronic stress factors can impact their health and relationships. They will learn healthy ways to manage their moods and utilize positive coping mechanisms. In addition, ICR patients will learn ways to improve communication skills. The purpose of this lesson is to expose patients to ways of understanding how one's mood and health are intimately connected. Developing a healthy outlook can help build positive relationships and connections with others. Patients will understand the importance of utilizing effective communication skills that include actively listening and being heard. They will learn and understand the importance of the "4 Cs" and especially Connections in fostering of a Healthy Mind-Set.  Healthy Sleep for a Healthy Heart Clinical staff led group instruction and group discussion with PowerPoint presentation and patient guidebook. To enhance the learning environment the use of posters, models and videos may be added. At the conclusion of this workshop, patients will be able to demonstrate knowledge of the importance of sleep to overall health, well-being, and quality of life. They will understand the symptoms of, and treatments for, common sleep disorders. Patients will also be able to identify daytime and nighttime behaviors which impact sleep, and they will be able to apply these tools to help manage sleep-related challenges. The purpose of this lesson is to provide patients with a general overview of sleep and outline the importance of quality  sleep. Patients will learn about a few of the most common sleep disorders. Patients will also be introduced to the concept of "sleep hygiene," and discover ways to self-manage certain sleeping problems through simple daily behavior changes. Finally, the workshop will motivate patients by clarifying the links between quality sleep and their goals of heart-healthy living.   Recognizing and Reducing Stress Clinical staff led group instruction and group discussion with PowerPoint presentation and patient guidebook. To enhance the learning environment the use of posters, models and videos may be added. At the conclusion of this workshop, patients will be able to understand the types of stress reactions, differentiate between acute and chronic stress, and recognize the impact that chronic stress has on their health. They will also be able to apply different coping mechanisms, such as reframing negative self-talk. Patients will have the opportunity to practice a variety of stress management techniques, such as deep abdominal breathing, progressive muscle relaxation, and/or guided imagery.  The purpose of this lesson is to educate patients on  the role of stress in their lives and to provide healthy techniques for coping with it.  Learning Barriers/Preferences:  Learning Barriers/Preferences - 05/15/22 1609       Learning Barriers/Preferences   Learning Barriers Sight;Hearing   Wears glasses has bilateral hearing aides   Learning Preferences Skilled Demonstration;Pictoral;Video             Education Topics:  Knowledge Questionnaire Score:  Knowledge Questionnaire Score - 05/15/22 1537       Knowledge Questionnaire Score   Pre Score 23/24             Core Components/Risk Factors/Patient Goals at Admission:  Personal Goals and Risk Factors at Admission - 05/15/22 0938       Core Components/Risk Factors/Patient Goals on Admission   Hypertension Yes    Intervention Provide education on  lifestyle modifcations including regular physical activity/exercise, weight management, moderate sodium restriction and increased consumption of fresh fruit, vegetables, and low fat dairy, alcohol moderation, and smoking cessation.;Monitor prescription use compliance.    Expected Outcomes Short Term: Continued assessment and intervention until BP is < 140/31m HG in hypertensive participants. < 130/838mHG in hypertensive participants with diabetes, heart failure or chronic kidney disease.;Long Term: Maintenance of blood pressure at goal levels.    Lipids Yes    Intervention Provide education and support for participant on nutrition & aerobic/resistive exercise along with prescribed medications to achieve LDL <7041mHDL >72m94m  Expected Outcomes Short Term: Participant states understanding of desired cholesterol values and is compliant with medications prescribed. Participant is following exercise prescription and nutrition guidelines.;Long Term: Cholesterol controlled with medications as prescribed, with individualized exercise RX and with personalized nutrition plan. Value goals: LDL < 70mg27mL > 40 mg.    Stress Yes    Intervention Offer individual and/or small group education and counseling on adjustment to heart disease, stress management and health-related lifestyle change. Teach and support self-help strategies.;Refer participants experiencing significant psychosocial distress to appropriate mental health specialists for further evaluation and treatment. When possible, include family members and significant others in education/counseling sessions.    Expected Outcomes Short Term: Participant demonstrates changes in health-related behavior, relaxation and other stress management skills, ability to obtain effective social support, and compliance with psychotropic medications if prescribed.;Long Term: Emotional wellbeing is indicated by absence of clinically significant psychosocial distress or social  isolation.             Core Components/Risk Factors/Patient Goals Review:   Goals and Risk Factor Review     Row Name 05/25/22 1205 06/26/22 1520 07/25/22 1305         Core Components/Risk Factors/Patient Goals Review   Personal Goals Review Hypertension;Lipids;Stress Hypertension;Lipids;Stress Weight Management/Obesity     Review Pat iFraser Dinff to great start in Intensive Cardiac Rehab with the completion of 3 exercise and 3 education sessions.  Pt reports compiance with heart related medicatios and his vital signsremain within norma limits pre/post exercise.  Pat hFraser Dinmet informally with the RD, Sam and he has attended nutritional education sessions regarding lipid management and compliance to statin therapy Pat hFraser Dinattended 10 exercise and 10 education classes in Intensive Cardiac rehab.  Pt vital signs remail with normal limits.  Pt with brief overnight stay in the hospital for pericarditis and increase fluid.  Pat iFraser Dinompliant with statin therapy and has made many changes in his diet. Pat hFraser Dinattended 22 exercise sessions and some education classes.  Pt vital signs remain withi norma limits.  Pt  with decreased weight of 2.0 kg.  Fraser Din is comliant with statin therapy and has made many changes in his diet.     Expected Outcomes Fraser Din will adopt a heart healthy lifestyle in accordance to the three pillars learned for Pritikin Intensive cardiac rehab: exercise, heart healthy nutrition and healthy mindset. Fraser Din will adopt a heart healthy lifestyle in accordance to the three pillars learned for Pritikin Intensive cardiac rehab: exercise, heart healthy nutrition and healthy mindset. Fraser Din will adopt a heart healthy lifestyle in accordance to the three pillars learned for Pritikin Intensive cardiac rehab: exercise, heart healthy nutrition and healthy mindset.              Core Components/Risk Factors/Patient Goals at Discharge (Final Review):   Goals and Risk Factor Review - 07/25/22 1305       Core  Components/Risk Factors/Patient Goals Review   Personal Goals Review Weight Management/Obesity    Review Fraser Din has attended 22 exercise sessions and some education classes.  Pt vital signs remain withi norma limits.  Pt with decreased weight of 2.0 kg.  Fraser Din is comliant with statin therapy and has made many changes in his diet.    Expected Outcomes Fraser Din will adopt a heart healthy lifestyle in accordance to the three pillars learned for Pritikin Intensive cardiac rehab: exercise, heart healthy nutrition and healthy mindset.             ITP Comments:  ITP Comments     Row Name 05/15/22 0903 05/25/22 1159 06/27/22 0836 07/25/22 1253     ITP Comments Medical Director- Dr. Fransico Him, MD. Introduction to Pritikin program at cardiac rehab orientation. Reviewed Initial resource packet with the patient. Patient took packet home to review. Fraser Din has completed 3 exercise and 3 education sessions during this 30 day ITP Review 30 day ITP Review 30 Day ITP Review, pt is doing well and looking forward to graduation soon             Comments: Pt is making expected progress toward personal goals after completing 22 sessions. Recommend continued exercise and life style modification education including  stress management and relaxation techniques to decrease cardiac risk profile.  Cherre Huger, BSN Cardiac and Training and development officer

## 2022-07-25 ENCOUNTER — Encounter (HOSPITAL_COMMUNITY)
Admission: RE | Admit: 2022-07-25 | Discharge: 2022-07-25 | Disposition: A | Discharge status: Home / Self Care | Payer: Medicare HMO | Source: Ambulatory Visit | Attending: Cardiology | Admitting: Cardiology

## 2022-07-25 DIAGNOSIS — Z955 Presence of coronary angioplasty implant and graft: Secondary | ICD-10-CM

## 2022-07-25 DIAGNOSIS — I213 ST elevation (STEMI) myocardial infarction of unspecified site: Secondary | ICD-10-CM | POA: Diagnosis not present

## 2022-07-27 ENCOUNTER — Encounter (HOSPITAL_COMMUNITY)
Admission: RE | Admit: 2022-07-27 | Discharge: 2022-07-27 | Disposition: A | Discharge status: Home / Self Care | Payer: Medicare HMO | Source: Ambulatory Visit | Attending: Cardiology | Admitting: Cardiology

## 2022-07-27 DIAGNOSIS — Z955 Presence of coronary angioplasty implant and graft: Secondary | ICD-10-CM

## 2022-07-27 DIAGNOSIS — I213 ST elevation (STEMI) myocardial infarction of unspecified site: Secondary | ICD-10-CM

## 2022-07-30 ENCOUNTER — Encounter (HOSPITAL_COMMUNITY)
Admission: RE | Admit: 2022-07-30 | Discharge: 2022-07-30 | Disposition: A | Discharge status: Home / Self Care | Payer: Medicare HMO | Source: Ambulatory Visit | Attending: Cardiology | Admitting: Cardiology

## 2022-07-30 VITALS — Ht 70.0 in | Wt 182.1 lb

## 2022-07-30 DIAGNOSIS — I213 ST elevation (STEMI) myocardial infarction of unspecified site: Secondary | ICD-10-CM | POA: Diagnosis not present

## 2022-07-30 DIAGNOSIS — Z955 Presence of coronary angioplasty implant and graft: Secondary | ICD-10-CM

## 2022-08-01 ENCOUNTER — Encounter (HOSPITAL_COMMUNITY)
Admission: RE | Admit: 2022-08-01 | Discharge: 2022-08-01 | Disposition: A | Discharge status: Home / Self Care | Payer: Medicare HMO | Source: Ambulatory Visit | Attending: Cardiology | Admitting: Cardiology

## 2022-08-01 DIAGNOSIS — I213 ST elevation (STEMI) myocardial infarction of unspecified site: Secondary | ICD-10-CM | POA: Diagnosis not present

## 2022-08-01 DIAGNOSIS — Z955 Presence of coronary angioplasty implant and graft: Secondary | ICD-10-CM

## 2022-08-03 ENCOUNTER — Encounter (HOSPITAL_COMMUNITY)
Admission: RE | Admit: 2022-08-03 | Discharge: 2022-08-03 | Disposition: A | Discharge status: Home / Self Care | Payer: Medicare HMO | Source: Ambulatory Visit | Attending: Cardiology | Admitting: Cardiology

## 2022-08-03 DIAGNOSIS — I213 ST elevation (STEMI) myocardial infarction of unspecified site: Secondary | ICD-10-CM | POA: Insufficient documentation

## 2022-08-03 DIAGNOSIS — Z955 Presence of coronary angioplasty implant and graft: Secondary | ICD-10-CM | POA: Insufficient documentation

## 2022-08-04 ENCOUNTER — Other Ambulatory Visit: Payer: Self-pay | Admitting: Nurse Practitioner

## 2022-08-07 ENCOUNTER — Other Ambulatory Visit (HOSPITAL_COMMUNITY): Payer: Medicare HMO

## 2022-08-07 ENCOUNTER — Ambulatory Visit (HOSPITAL_COMMUNITY)
Admission: RE | Admit: 2022-08-07 | Discharge: 2022-08-07 | Disposition: A | Discharge status: Home / Self Care | Payer: Medicare HMO | Source: Ambulatory Visit | Attending: Cardiology | Admitting: Cardiology

## 2022-08-07 DIAGNOSIS — R6889 Other general symptoms and signs: Secondary | ICD-10-CM | POA: Insufficient documentation

## 2022-08-07 DIAGNOSIS — I255 Ischemic cardiomyopathy: Secondary | ICD-10-CM | POA: Diagnosis not present

## 2022-08-07 DIAGNOSIS — I25110 Atherosclerotic heart disease of native coronary artery with unstable angina pectoris: Secondary | ICD-10-CM | POA: Diagnosis not present

## 2022-08-07 NOTE — Progress Notes (Signed)
Discharge Progress Report  Patient Details  Name: Miguel Walker MRN: [id number] Date of Birth: 15-Nov-1941 Referring Provider:   Flowsheet Row INTENSIVE CARDIAC REHAB ORIENT from 05/15/2022 in [place]  Referring Provider Miguel Breeding, MD        Number of Visits: 26  Reason for Discharge:  Patient reached a stable level of exercise. Patient independent in their exercise. Patient has met program and personal goals.  Smoking History:  Social History   Tobacco Use  Smoking Status Never  Smokeless Tobacco Never    Diagnosis:  02/05/22 STEMI  02/05/22 DES x3  ADL [facility]SD:   Initial Exercise Prescription:  Initial Exercise Prescription - 05/15/22 1000       Date of Initial Exercise RX and Referring Provider   Date 05/15/22    Referring Provider Miguel Breeding, MD    Expected Discharge Date 07/13/22      Treadmill   MPH 2.5    Grade 0    Minutes 15    METs 2.91      NuStep   Level 3    SPM 85    Minutes 15    METs 2.8      Prescription Details   Frequency (times per week) 3    Duration Progress to 30 minutes of continuous aerobic without signs/symptoms of physical distress      Intensity   THRR 40-80% of Max Heartrate 56-113    Ratings of Perceived Exertion 11-13    Perceived Dyspnea 0-4      Progression   Progression Continue to progress workloads to maintain intensity without signs/symptoms of physical distress.      Resistance Training   Training Prescription Yes    Weight 5 lbs    Reps 10-15             Discharge Exercise Prescription (Final Exercise Prescription Changes):  Exercise Prescription Changes - 08/03/22 0830       Response to Exercise   Blood Pressure (Admit) 106/62    Blood Pressure (Exercise) 128/70    Blood Pressure (Exit) 102/60    Heart Rate (Admit) 72 bpm    Heart Rate (Exercise) 120 bpm    Heart Rate (Exit) 78 bpm    Rating of Perceived Exertion (Exercise) 13    Perceived Dyspnea  (Exercise) 0    Symptoms 0    Comments Pt graduated the CRP2 program    Duration Progress to 30 minutes of  aerobic without signs/symptoms of physical distress    Intensity THRR unchanged      Progression   Progression Continue to progress workloads to maintain intensity without signs/symptoms of physical distress.    Average METs 4.12      Resistance Training   Training Prescription Yes    Weight 6 lbs wts    Reps 10-15    Time 10 Minutes      Treadmill   MPH 3.3    Grade 2    Minutes 15    METs 4.43      NuStep   Level 6    SPM 95    Minutes 15    METs 3.8      Home Exercise Plan   Plans to continue exercise at Home (comment)    Frequency Add 3 additional days to program exercise sessions.    Initial Home Exercises Provided 06/11/22             Functional Capacity:  6 Minute Walk  [name] Name 05/15/22 0958 07/30/22 0927       6 Minute Walk   Phase Initial Discharge    Distance 1813 feet 1950 feet    Distance % Change -- 7.56 %    Distance Feet Change -- 138 ft    Walk Time 6 minutes 6 minutes    # of Rest Breaks 0 0    MPH 3.43 3.69    METS 3.63 4.13    RPE 11 11    Perceived Dyspnea  0 0    VO2 Peak 12.7 14.47    Symptoms No No    Resting HR 76 bpm 86 bpm    Resting BP 118/72 122/62    Resting Oxygen Saturation  97 % 98 %    Exercise Oxygen Saturation  during 6 min walk 99 % 99 %    Max Ex. HR 110 bpm 127 bpm    Max Ex. BP 148/72 162/62    2 Minute Post BP 118/72 128/62             Psychological, QOL, Others - Outcomes: PHQ 2/9:    05/15/2022    4:12 PM  Depression screen PHQ 2/9  Decreased Interest 0  Down, Depressed, Hopeless 0  PHQ - 2 Score 0    Quality of Life:  Quality of Life - 05/15/22 1529       Quality of Life   Select Quality of Life      Quality of Life Scores   Health/Function Pre 24.47 %    Socioeconomic Pre 27.21 %    Psych/Spiritual Pre 26.57 %    Family Pre 23.8 %    GLOBAL Pre 25.37 %              Personal Goals: Goals established at orientation with interventions provided to work toward goal.  Personal Goals and Risk Factors at Admission - 05/15/22 0938       Core Components/Risk Factors/Patient Goals on Admission   Hypertension Yes    Intervention Provide education on lifestyle modifcations including regular physical activity/exercise, weight management, moderate sodium restriction and increased consumption of fresh fruit, vegetables, and low fat dairy, alcohol moderation, and smoking cessation.;Monitor prescription use compliance.    Expected Outcomes Short Term: Continued assessment and intervention until BP is < 140/24m HG in hypertensive participants. < 130/867mHG in hypertensive participants with diabetes, heart failure or chronic kidney disease.;Long Term: Maintenance of blood pressure at goal levels.    Lipids Yes    Intervention Provide education and support for participant on nutrition & aerobic/resistive exercise along with prescribed medications to achieve LDL <7055mHDL >36m86m  Expected Outcomes Short Term: Participant states understanding of desired cholesterol values and is compliant with medications prescribed. Participant is following exercise prescription and nutrition guidelines.;Long Term: Cholesterol controlled with medications as prescribed, with individualized exercise RX and with personalized nutrition plan. Value goals: LDL < 70mg106mL > 40 mg.    Stress Yes    Intervention Offer individual and/or small group education and counseling on adjustment to heart disease, stress management and health-related lifestyle change. Teach and support self-help strategies.;Refer participants experiencing significant psychosocial distress to appropriate mental health specialists for further evaluation and treatment. When possible, include family members and significant others in education/counseling sessions.    Expected Outcomes Short Term: Participant demonstrates changes  in health-related behavior, relaxation and other stress management skills, ability to obtain effective social support, and compliance with psychotropic medications if prescribed.;Long Term: Emotional  wellbeing is indicated by absence of clinically significant psychosocial distress or social isolation.              Personal Goals Discharge:  Goals and Risk Factor Review     Row Name 05/25/22 1205 06/26/22 1520 07/25/22 1305 08/07/22 1515       Core Components/Risk Factors/Patient Goals Review   Personal Goals Review Hypertension;Lipids;Stress Hypertension;Lipids;Stress Weight Management/Obesity Hypertension;Lipids;Stress    Review Miguel Walker is off to great start in Intensive Cardiac Rehab with the completion of 3 exercise and 3 education sessions.  Pt reports compiance with heart related medicatios and his vital signsremain within norma limits pre/post exercise.  Miguel Walker has met informally with the RD, Sam and he has attended nutritional education sessions regarding lipid management and compliance to statin therapy Miguel Walker has attended 10 exercise and 10 education classes in Intensive Cardiac rehab.  Pt vital signs remail with normal limits.  Pt with brief overnight stay in the hospital for pericarditis and increase fluid.  Miguel Walker is compliant with statin therapy and has made many changes in his diet. Miguel Walker has attended 22 exercise sessions and some education classes.  Pt vital signs remain withi norma limits.  Pt with decreased weight of 2.0 kg.  Miguel Walker is comliant with statin therapy and has made many changes in his diet. Patient has continued to progress towards goals; patient will maintain a healthy lifestyle as he continues to [place] the knowledge and tools provided to him in Cardiac rehab.    Expected Outcomes Miguel Walker will adopt a heart healthy lifestyle in accordance to the three pillars learned for [facility] Intensive cardiac rehab: exercise, heart healthy nutrition and healthy mindset. Miguel Walker will adopt a heart healthy  lifestyle in accordance to the three pillars learned for [facility] Intensive cardiac rehab: exercise, heart healthy nutrition and healthy mindset. Miguel Walker will adopt a heart healthy lifestyle in accordance to the three pillars learned for [facility] Intensive cardiac rehab: exercise, heart healthy nutrition and healthy mindset. --             Exercise Goals and Review:  Exercise Goals     Row Name 05/15/22 0940             Exercise Goals   Increase Physical Activity Yes       Intervention Provide advice, education, support and counseling about physical activity/exercise needs.;Develop an individualized exercise prescription for aerobic and resistive training based on initial evaluation findings, risk stratification, comorbidities and participant's personal goals.       Expected Outcomes Short Term: Attend rehab on a regular basis to increase amount of physical activity.;Long Term: Exercising regularly at least 3-5 days a week.       Increase Strength and Stamina Yes       Intervention Provide advice, education, support and counseling about physical activity/exercise needs.;Develop an individualized exercise prescription for aerobic and resistive training based on initial evaluation findings, risk stratification, comorbidities and participant's personal goals.       Expected Outcomes Short Term: Increase workloads from initial exercise prescription for resistance, speed, and METs.;Short Term: Perform resistance training exercises routinely during rehab and add in resistance training at home;Long Term: Improve cardiorespiratory fitness, muscular endurance and strength as measured by increased METs and functional capacity (6MWT)       Able to understand and use rate of perceived exertion (RPE) scale Yes       Intervention Provide education and explanation on how to use RPE scale       Expected Outcomes  Short Term: Able to use RPE daily in rehab to express subjective intensity level;Long Term:  Able  to use RPE to guide intensity level when exercising independently       Knowledge and understanding of Target Heart Rate Range (THRR) Yes       Intervention Provide education and explanation of THRR including how the numbers were predicted and where they are located for reference       Expected Outcomes Short Term: Able to state/look up THRR;Long Term: Able to use THRR to govern intensity when exercising independently;Short Term: Able to use daily as guideline for intensity in rehab       Able to check pulse independently Yes       Intervention Provide education and demonstration on how to check pulse in carotid and radial arteries.;Review the importance of being able to check your own pulse for safety during independent exercise       Expected Outcomes Short Term: Able to explain why pulse checking is important during independent exercise;Long Term: Able to check pulse independently and accurately       Understanding of Exercise Prescription Yes       Intervention Provide education, explanation, and written materials on patient's individual exercise prescription       Expected Outcomes Short Term: Able to explain program exercise prescription;Long Term: Able to explain home exercise prescription to exercise independently                Exercise Goals Re-Evaluation:  Exercise Goals Re-Evaluation     Row Name 05/21/22 0854 06/11/22 0830 06/25/22 1157 07/02/22 0848 08/03/22 0830     Exercise Goal Re-Evaluation   Exercise Goals Review Increase Physical Activity;Increase Strength and Stamina;Able to understand and use rate of perceived exertion (RPE) scale;Knowledge and understanding of Target Heart Rate Range (THRR);Understanding of Exercise Prescription Increase Physical Activity;Increase Strength and Stamina;Able to understand and use rate of perceived exertion (RPE) scale;Knowledge and understanding of Target Heart Rate Range (THRR);Understanding of Exercise Prescription Increase Physical  Activity;Increase Strength and Stamina;Able to understand and use rate of perceived exertion (RPE) scale;Knowledge and understanding of Target Heart Rate Range (THRR);Understanding of Exercise Prescription Increase Physical Activity;Increase Strength and Stamina;Able to understand and use rate of perceived exertion (RPE) scale;Knowledge and understanding of Target Heart Rate Range (THRR);Understanding of Exercise Prescription Increase Physical Activity;Increase Strength and Stamina;Able to understand and use rate of perceived exertion (RPE) scale;Knowledge and understanding of Target Heart Rate Range (THRR);Understanding of Exercise Prescription   Comments Pt first day in the CRP2 program. Pt tolerated exercise well with an average MET level of 2.66. Pt is learning his THRR, RPE and ExRx Reviewed MET's, goals and home Ex Rx. Pt tolerated exercise well with an average MET level of 3.0. Pt will continue to exercise on his own 3 days a week by golfing or walking 30+ minutes. Pt is very happy about walking an average of 7500 steps per day for exercise. Pt feels he is making gradual progression for leg, and arm strength as well. Reviewed MET's. Pt tolerated exercise well with an average MET level of 3.32. Pt is feeling good about his exercise plan so far and had a smooth travel week last week to visit family. Reviewed MET's and goals. Pt tolerated exercise well with an average MET level of 3.76. Pt is feeling good about his goals and is getting 10,000 steps on 3+ days a week and is enjoying golf. Pt feels an increase in strength and is going to increase  his wts for home use. Pt has a referral in for [facility] and would like to continue exercise there upon graduation. Pt graduated the CRP2 program. Pt tolerated exercise well with an average MET level of 4.12. Pt will continue to exercise by joining [place] and golfing 5-7 days for 60 mins per session. Pt is excited about trying different exercise classes at [facility].    Expected Outcomes Will continue to monitor pt and progress workloads as tolerated without sign or symptom Pt will continue to exercise on his own 3 days a week for 30+ mins per session. Will continue to monitor pt and progress workloads as tolerated without sign or symptom Will continue to monitor pt and progress workloads as tolerated without sign or symptom Will continue to monitor pt and progress workloads as tolerated without sign or symptom Pt will continue to exercise on his own and gain strength.            Nutrition & Weight - Outcomes:  Pre Biometrics - 05/15/22 0903       Pre Biometrics   Waist Circumference 36.5 inches    Hip Circumference 42 inches    Waist to Hip Ratio 0.87 %    Triceps Skinfold 16 mm    % Body Fat 26 %    Grip Strength 26 kg    Flexibility 9.5 in    Single Leg Stand 10.5 seconds             Post Biometrics - 07/30/22 0930        Post  Biometrics   Height _0  (1.778 m)    Weight 82.6 kg    Waist Circumference 38.25 inches    Hip Circumference 41 inches    Waist to Hip Ratio 0.93 %    BMI (Calculated) 26.13    Triceps Skinfold 15 mm    % Body Fat 26.7 %    Grip Strength 36 kg    Flexibility 12 in    Single Leg Stand 30 seconds             Nutrition:  Nutrition Therapy & Goals - 07/18/22 1021       Nutrition Therapy   Diet Heart Healthy Diet    Drug/Food Interactions Statins/Certain Fruits      Personal Nutrition Goals   Nutrition Goal Patient to choose a variety of fruits, vegetables, whole grains, lean protein/plant protein, nonfat dairy daily as part of a heart healthy diet.    Personal Goal #2 Patient to limit daily sodium intake to <1561m.    Personal Goal #3 Patient to identify and limit daily food sources of saturated fat, trans fat, sodium, and refined carbohydrates    Comments Goals in progress. Per diet recall, PFraser Dincontinues reading food labels for sodium, increased non-starchy vegetables and mindfulness of portion  sizes.      Intervention Plan   Intervention Prescribe, educate and counsel regarding individualized specific dietary modifications aiming towards targeted core components such as weight, hypertension, lipid management, diabetes, heart failure and other comorbidities.;Nutrition handout(s) given to patient.    Expected Outcomes Short Term Goal: Understand basic principles of dietary content, such as calories, fat, sodium, cholesterol and nutrients.;Short Term Goal: A plan has been developed with personal nutrition goals set during dietitian appointment.;Long Term Goal: Adherence to prescribed nutrition plan.             Nutrition Discharge:  Nutrition Assessments - 07/27/22 1156       Rate Your Plate Scores  Post Score 88             Education Questionnaire Score:  Knowledge Questionnaire Score - 05/15/22 1537       Knowledge Questionnaire Score   Pre Score 23/24             Goals reviewed with patient; copy given to patient.  Pt graduates from Intensive cardiac rehab program today with completion of 26 exercise and education sessions. Pt maintained good attendance and progressed nicely during their participation in rehab as evidenced by increased MET level, decreased weight, and increased endurance. Medication list reconciled. Repeat  PHQ-9 score-0. Miguel Walker has made significant lifestyle changes and should be commended for his success. Pat achieved his goals during cardiac rehab; we are so proud of him and will miss him so much! Miguel Walker has an energetic and positive attitude that is infectious to everyone around him.Pt plans to continue exercise at [facility] 3 times weekly using the equipment as well as the aquatic exercises.   Albertine Grates RN 08/03/2022 [phone]

## 2022-08-12 DIAGNOSIS — I319 Disease of pericardium, unspecified: Secondary | ICD-10-CM | POA: Insufficient documentation

## 2022-08-12 NOTE — Progress Notes (Unsigned)
Cardiology Office Note   Date:  08/14/2022   ID:  Miguel Walker, DOB March 03, 1941, MRN [id number]  PCP:  London Pepper, MD  Cardiologist:   Minus Breeding, MD Referring:  London Pepper, MD    Chief Complaint  Patient presents with   Coronary Artery Disease      History of Present Illness: Miguel Walker is a 81 y.o. male who is self referred for evaluation of an abnormal echo.   In 2020 an echo demonstrated very slight aortic valve gradient and an aortic root enlargement.  I followed up with a CT which demonstrated 38 - 39 mm enlargement.   He had an ST elevation MI.  He had anatomy as described below.  He had a mildly reduced EF.   He had 3rd degree AV block and had follow up with a Zio patch.   Follow up monitor did not demonstrate high grade stenosis.  At a previous visit I titrated spironolactone.  He does give me some results of the peripheral vascular screening that suggests right foot 0.77 ABI and left 0.57.  However, he has excellent pulses.  He has no resting pain.  He has no nonhealing ulcers.  He was admitted to the hospital June 29 through June 30 with atypical chest pain.  Troponin mildly elevated with flat trend.  Elevated CRP.  CTA chest with no PE though evidence of pericardial thickening versus pericardial effusion.  Echo EF 50 to 55%.  Started on colchicine for possible pericarditis.  He has joined [employer].  The patient denies any new symptoms such as chest discomfort, neck or arm discomfort. There has been no new shortness of breath, PND or orthopnea. There have been no reported palpitations, presyncope or syncope.   He is not having any of the discomfort that was thought to be his pericarditis.   Past Medical History:  Diagnosis Date   Aneurysm, ascending aorta ([place])    Cancer ([place]) 1997   Prostate cancer   Coronary artery disease    Dyslipidemia    Family history of premature CAD    NO ISCHEMIC SYMPTOMS   Hyperlipidemia    Hypertension    Thyroid nodule      Past Surgical History:  Procedure Laterality Date   CARDIAC CATHETERIZATION     CARPAL TUNNEL RELEASE     CORONARY/GRAFT ACUTE MI REVASCULARIZATION N/A 02/05/2022   Procedure: Coronary/Graft Acute MI Revascularization;  Surgeon: Leonie Man, MD;  Location: [facility];  Service: Cardiovascular;  Laterality: N/A;   HAND SURGERY     LEFT HEART CATH AND CORONARY ANGIOGRAPHY N/A 02/05/2022   Procedure: LEFT HEART CATH AND CORONARY ANGIOGRAPHY;  Surgeon: Leonie Man, MD;  Location: [facility];  Service: Cardiovascular;  Laterality: N/A;   ROTATOR CUFF REPAIR     URINARY SPHINCTER IMPLANT       Current Outpatient Medications  Medication Sig Dispense Refill   ascorbic acid (VITAMIN C) 500 MG tablet Take 500 mg by mouth in the morning.     aspirin EC 81 MG tablet Take 81 mg by mouth in the morning.     BRILINTA 90 MG TABS tablet TAKE 1 TABLET BY MOUTH TWICE  DAILY (Patient taking differently: Take 90 mg by mouth 2 (two) times daily.) 180 tablet 3   colchicine 0.6 MG tablet Take 1 tablet (0.6 mg total) by mouth 2 (two) times daily. 60 tablet 2   FARXIGA 10 MG TABS tablet TAKE 1 TABLET BY MOUTH  DAILY (Patient taking differently: Take 10 mg by mouth daily.) 90 tablet 3   fluorouracil (EFUDEX) 5 % cream Apply topically as needed.     hydrocortisone 2.5 % cream Apply 1 application  topically 2 (two) times daily as needed (irritation.).     metroNIDAZOLE (METROCREAM) 0.75 % cream Apply 1 application  topically 2 (two) times daily as needed (skin rash).     Multiple Vitamin (MULTI-VITAMIN) tablet Take 1 tablet by mouth in the morning. Centrum Silver     MYRBETRIQ 25 MG TB24 tablet Take 25 mg by mouth in the morning.     nitroGLYCERIN (NITROSTAT) 0.4 MG SL tablet Place 1 tablet (0.4 mg total) under the tongue every 5 (five) minutes as needed for chest pain. 25 tablet 3   Omega-3 1000 MG CAPS Take 1,000 mg by mouth in the morning.     spironolactone (ALDACTONE) 25 MG tablet  TAKE ONE-HALF TABLET BY MOUTH  DAILY (Patient taking differently: Take 25 mg by mouth daily.) 45 tablet 3   tolterodine (DETROL LA) 4 MG 24 hr capsule Take 4 mg by mouth in the morning.     triamcinolone (KENALOG) 0.025 % cream Apply 1 application  topically 2 (two) times daily as needed (skin irritation).     rosuvastatin (CRESTOR) 40 MG tablet Take 1 tablet (40 mg total) by mouth daily. 90 tablet 3   No current facility-administered medications for this visit.    Allergies:   Patient has no known allergies.   ROS:  Please see the history of present illness.   Otherwise, review of systems are positive for none.   All other systems are reviewed and negative.    PHYSICAL EXAM: VS:  BP 108/60   Pulse 83   Ht '5\' 11"'$  (1.803 m)   Wt 181 lb (82.1 kg)   SpO2 99%   BMI 25.24 kg/m  , BMI Body mass index is 25.24 kg/m. GENERAL:  Well appearing NECK:  No jugular venous distention, waveform within normal limits, carotid upstroke brisk and symmetric, no bruits, no thyromegaly LUNGS:  Clear to auscultation bilaterally CHEST:  Unremarkable HEART:  PMI not displaced or sustained,S1 and S2 within normal limits, no S3, no S4, no clicks, no rubs, no murmurs ABD:  Flat, positive bowel sounds normal in frequency in pitch, no bruits, no rebound, no guarding, no midline pulsatile mass, no hepatomegaly, no splenomegaly EXT:  2 plus pulses throughout, no edema, no cyanosis no clubbing   EKG:  EKG is  not ordered today.    Recent Labs: 05/31/2022: B Natriuretic Peptide 110.1 06/01/2022: ALT 18; BUN 12; Creatinine, Ser 0.89; Hemoglobin 13.6; Magnesium 2.4; Platelets 226; Potassium 3.8; Sodium 138    Lipid Panel    Component Value Date/Time   CHOL 133 05/11/2022 0842   TRIG 134 05/11/2022 0842   HDL 50 05/11/2022 0842   CHOLHDL 2.7 05/11/2022 0842   CHOLHDL 2.4 02/05/2022 1817   VLDL 11 02/05/2022 1817   LDLCALC 60 05/11/2022 0842      Wt Readings from Last 3 Encounters:  08/14/22 181 lb  (82.1 kg)  07/30/22 182 lb 1.6 oz (82.6 kg)  06/27/22 183 lb 12.8 oz (83.4 kg)    Cardiac cath  Diagnostic Dominance: Right Intervention   Other studies Reviewed: Additional studies/ records that were reviewed today include: Labs. Review of the above records demonstrates:  Please see elsewhere in the note.     ASSESSMENT AND PLAN:  CAD:   The patient has no new  sypmtoms.  No further cardiovascular testing is indicated.  We will continue with aggressive risk reduction and meds as listed.  Pericarditis: He is going to stop his colchicine after a total of 3 months.   ICM:   He is doing well with med as listed.  Blood pressures are somewhat labile so no further titration.   Complete heart block:    This was post his acute event.  He has had no recurrence of hypotension or bradycardia.  No change in therapy.   Hypertension:   This is being managed in the context of treating his CH  Hyperlipidemia:   LDL done recently was at target 60.  No change in therapy.  HDL was 58.   Dilation of ascending aorta:    I will follow this up again next year.   Current medicines are reviewed at length with the patient today.  The patient does not have concerns regarding medicines.  The following changes have been made: None.  Labs/ tests ordered today include:   None  No orders of the defined types were placed in this encounter.     Disposition:   FU with me in March.    Signed, Minus Breeding, MD  08/14/2022 2:42 PM    [facility]

## 2022-08-14 ENCOUNTER — Encounter: Payer: Self-pay | Admitting: Cardiology

## 2022-08-14 ENCOUNTER — Ambulatory Visit: Payer: Medicare HMO | Attending: Cardiology | Admitting: Cardiology

## 2022-08-14 VITALS — BP 108/60 | HR 83 | Ht 71.0 in | Wt 181.0 lb

## 2022-08-14 DIAGNOSIS — E785 Hyperlipidemia, unspecified: Secondary | ICD-10-CM | POA: Diagnosis not present

## 2022-08-14 DIAGNOSIS — I255 Ischemic cardiomyopathy: Secondary | ICD-10-CM

## 2022-08-14 DIAGNOSIS — I25110 Atherosclerotic heart disease of native coronary artery with unstable angina pectoris: Secondary | ICD-10-CM

## 2022-08-14 DIAGNOSIS — I319 Disease of pericardium, unspecified: Secondary | ICD-10-CM | POA: Diagnosis not present

## 2022-08-14 DIAGNOSIS — I442 Atrioventricular block, complete: Secondary | ICD-10-CM

## 2022-08-14 NOTE — Patient Instructions (Signed)
  Follow-Up: At [facility], you and your health needs are our priority.  As part of our continuing mission to provide you with exceptional heart care, we have created designated Provider Care Teams.  These Care Teams include your primary Cardiologist (physician) and Advanced Practice Providers (APPs -  Physician Assistants and Nurse Practitioners) who all work together to provide you with the care you need, when you need it.  We recommend signing up for the patient portal called "MyChart".  Sign up information is provided on this After Visit Summary.  MyChart is used to connect with patients for Virtual Visits (Telemedicine).  Patients are able to view lab/test results, encounter notes, upcoming appointments, etc.  Non-urgent messages can be sent to your provider as well.   To learn more about what you can do with MyChart, go to [email or web address].    Your next appointment:   6 month(s)  The format for your next appointment:   In Person  Provider:   James Hochrein, MD           

## 2022-09-08 ENCOUNTER — Other Ambulatory Visit: Payer: Self-pay | Admitting: Cardiology

## 2022-09-11 ENCOUNTER — Encounter: Payer: Self-pay | Admitting: Surgery

## 2022-12-26 MED ORDER — SPIRONOLACTONE 25 MG PO TABS: 25.0000 mg | ORAL_TABLET | Freq: Every day | ORAL | 2 refills | Status: DC

## 2023-01-04 ENCOUNTER — Other Ambulatory Visit: Payer: Self-pay | Admitting: Nurse Practitioner

## 2023-01-24 ENCOUNTER — Other Ambulatory Visit: Payer: Self-pay | Admitting: Cardiology

## 2023-01-25 ENCOUNTER — Telehealth: Payer: Self-pay

## 2023-01-25 DIAGNOSIS — E785 Hyperlipidemia, unspecified: Secondary | ICD-10-CM

## 2023-01-28 NOTE — Telephone Encounter (Signed)
Minus Breeding, MD  Orma Render, [place] minutes ago (7:48 AM)    BMET and fasting lipid profile please.      Called and made patient aware blood work has been ordered. Patient aware of lab instructions and verbalized understanding.   Advised patient to call back to office with any issues, questions, or concerns. Patient verbalized understanding.

## 2023-02-06 LAB — BASIC METABOLIC PANEL
BUN/Creatinine Ratio: 22 (ref 10–24)
BUN: 19 mg/dL (ref 8–27)
CO2: 26 mmol/L (ref 20–29)
Calcium: 9.6 mg/dL (ref 8.6–10.2)
Chloride: 101 mmol/L (ref 96–106)
Creatinine, Ser: 0.85 mg/dL (ref 0.76–1.27)
Glucose: 100 mg/dL — ABNORMAL HIGH (ref 70–99)
Potassium: 4.6 mmol/L (ref 3.5–5.2)
Sodium: 139 mmol/L (ref 134–144)
eGFR: 88 mL/min/{1.73_m2} (ref >59)

## 2023-02-06 LAB — LIPID PANEL
Chol/HDL Ratio: 2.6 ratio (ref 0.0–5.0)
Cholesterol, Total: 144 mg/dL (ref 100–199)
HDL: 56 mg/dL (ref >39)
LDL Chol Calc (NIH): 66 mg/dL (ref 0–99)
Triglycerides: 124 mg/dL (ref 0–149)
VLDL Cholesterol Cal: 22 mg/dL (ref 5–40)

## 2023-02-12 ENCOUNTER — Ambulatory Visit: Payer: Medicare HMO | Admitting: Cardiology

## 2023-02-18 NOTE — Progress Notes (Unsigned)
Cardiology Office Note   Date:  02/19/2023   ID:  Miguel Walker, DOB 23-Apr-1941, MRN [id number]  PCP:  London Pepper, MD  Cardiologist:   Minus Breeding, MD Referring:  London Pepper, MD    Chief Complaint  Patient presents with   Coronary Artery Disease      History of Present Illness: Miguel Walker is a 82 y.o. male who is self referred for evaluation of an abnormal echo.   In 2020 an echo demonstrated very slight aortic valve gradient and an aortic root enlargement.  I followed up with a CT which demonstrated 38 - 39 mm enlargement.   He had an ST elevation MI.  He had anatomy as described below.  He had a mildly reduced EF.   He had 3rd degree AV block and had follow up with a Zio patch.   Follow up monitor did not demonstrate high grade stenosis.  At a previous visit I titrated spironolactone.  He does give me some results of the peripheral vascular screening that suggests right foot 0.77 ABI and left 0.57.  However, he has excellent pulses.  He has no resting pain.  He has no nonhealing ulcers.  He was admitted to the hospital June 29 through June 30 with atypical chest pain.  Troponin mildly elevated with flat trend.  Elevated CRP.  CTA chest with no PE though evidence of pericardial thickening versus pericardial effusion.  Echo EF 50 to 55%.  Started on colchicine for possible pericarditis.  Since he was last seen he has done well.  He is golfing.  He works out with [employer].  He has joined [employer]  The patient denies any new symptoms such as chest discomfort, neck or arm discomfort. There has been no new shortness of breath, PND or orthopnea. There have been no reported palpitations, presyncope or syncope.    Past Medical History:  Diagnosis Date   Aneurysm, ascending aorta ([place])    Cancer ([place]) 1997   Prostate cancer   Coronary artery disease    Dyslipidemia    Family history of premature CAD    NO ISCHEMIC SYMPTOMS   Hyperlipidemia    Hypertension     Thyroid nodule     Past Surgical History:  Procedure Laterality Date   CARDIAC CATHETERIZATION     CARPAL TUNNEL RELEASE     CORONARY/GRAFT ACUTE MI REVASCULARIZATION N/A 02/05/2022   Procedure: Coronary/Graft Acute MI Revascularization;  Surgeon: Leonie Man, MD;  Location: [facility];  Service: Cardiovascular;  Laterality: N/A;   HAND SURGERY     INGUINAL HERNIA REPAIR Right 1982   in [place] - open w mesh   LEFT HEART CATH AND CORONARY ANGIOGRAPHY N/A 02/05/2022   Procedure: LEFT HEART CATH AND CORONARY ANGIOGRAPHY;  Surgeon: Leonie Man, MD;  Location: [facility];  Service: Cardiovascular;  Laterality: N/A;   ROTATOR CUFF REPAIR     URINARY SPHINCTER IMPLANT  2019   Dr Rolanda Lundborg, [place]     Current Outpatient Medications  Medication Sig Dispense Refill   ascorbic acid (VITAMIN C) 500 MG tablet Take 500 mg by mouth in the morning.     aspirin EC 81 MG tablet Take 81 mg by mouth in the morning.     BRILINTA 90 MG TABS tablet TAKE 1 TABLET BY MOUTH TWICE  DAILY (Patient taking differently: Take 90 mg by mouth 2 (two) times daily.) 180 tablet 3   dapagliflozin propanediol (FARXIGA)  10 MG TABS tablet TAKE 1 TABLET BY MOUTH DAILY 90 tablet 1   fluorouracil (EFUDEX) 5 % cream Apply topically as needed.     hydrocortisone 2.5 % cream Apply 1 application  topically 2 (two) times daily as needed (irritation.).     metroNIDAZOLE (METROCREAM) 0.75 % cream Apply 1 application  topically 2 (two) times daily as needed (skin rash).     Multiple Vitamin (MULTI-VITAMIN) tablet Take 1 tablet by mouth in the morning. Centrum Silver     MYRBETRIQ 25 MG TB24 tablet Take 50 mg by mouth in the morning.     Omega-3 1000 MG CAPS Take 1,000 mg by mouth in the morning.     rosuvastatin (CRESTOR) 40 MG tablet Take 1 tablet (40 mg total) by mouth daily. 90 tablet 0   spironolactone (ALDACTONE) 25 MG tablet Take 1 tablet (25 mg total) by mouth daily. 90 tablet 2   tolterodine  (DETROL LA) 4 MG 24 hr capsule Take 4 mg by mouth in the morning.     triamcinolone (KENALOG) 0.025 % cream Apply 1 application  topically 2 (two) times daily as needed (skin irritation).     nitroGLYCERIN (NITROSTAT) 0.4 MG SL tablet Place 1 tablet (0.4 mg total) under the tongue every 5 (five) minutes as needed for chest pain. 25 tablet 3   No current facility-administered medications for this visit.    Allergies:   Patient has no known allergies.   ROS:  Please see the history of present illness.   Otherwise, review of systems are positive for none.   All other systems are reviewed and negative.    PHYSICAL EXAM: VS:  BP 112/66 (BP Location: Left Arm, Patient Position: Sitting, Cuff Size: Normal)   Pulse 76   Ht 5\' 11"  (1.803 m)   Wt 185 lb (83.9 kg)   BMI 25.80 kg/m  , BMI Body mass index is 25.8 kg/m. GENERAL:  Well appearing NECK:  No jugular venous distention, waveform within normal limits, carotid upstroke brisk and symmetric, no bruits, no thyromegaly LUNGS:  Clear to auscultation bilaterally CHEST:  Unremarkable HEART:  PMI not displaced or sustained,S1 and S2 within normal limits, no S3, no S4, no clicks, no rubs, no murmurs ABD:  Flat, positive bowel sounds normal in frequency in pitch, no bruits, no rebound, no guarding, no midline pulsatile mass, no hepatomegaly, no splenomegaly EXT:  2 plus pulses throughout, no edema, no cyanosis no clubbing   EKG:  EKG is   ordered today. Sinus rhythm, rate 76, axis within normal limits, intervals within normal limits, no acute ST-T wave changes.   Recent Labs: 05/31/2022: B Natriuretic Peptide 110.1 06/01/2022: ALT 18; Hemoglobin 13.6; Magnesium 2.4; Platelets 226 02/05/2023: BUN 19; Creatinine, Ser 0.85; Potassium 4.6; Sodium 139    Lipid Panel    Component Value Date/Time   CHOL 144 02/05/2023 0900   TRIG 124 02/05/2023 0900   HDL 56 02/05/2023 0900   CHOLHDL 2.6 02/05/2023 0900   CHOLHDL 2.4 02/05/2022 1817   VLDL 11  02/05/2022 1817   LDLCALC 66 02/05/2023 0900      Wt Readings from Last 3 Encounters:  02/19/23 185 lb (83.9 kg)  08/14/22 181 lb (82.1 kg)  07/30/22 182 lb 1.6 oz (82.6 kg)    Cardiac cath  Diagnostic Dominance: Right Intervention   Other studies Reviewed: Additional studies/ records that were reviewed today include: Labs. Review of the above records demonstrates:  Please see elsewhere in the note.  ASSESSMENT AND PLAN:  CAD:   The patient has no new sypmtoms.  No further cardiovascular testing is indicated.  We will continue with aggressive risk reduction and meds as listed.  Given the number of stents and length of the stented area I would like him to remain on dual antiplatelet therapy.  (Of note he could hold his Brilinta for colonoscopy.)  Pericarditis:     The plan was to stop his colchicine but he has been continuing this.  He can discontinue the colchicine.  ICM:   EF is low normal 50 to 55% with some mild inferior wall hypokinesis.  He has not tolerated med titration of labile blood pressures.  No change in therapy.  Complete heart block:    This was post his acute event and has had no problems since then.  No change in therapy.  Hypertension:    This is being managed in the context of treating his CHF  Hyperlipidemia:   LDL was 66 with an HDL of 56.  No change in therapy.  Dilation of ascending aorta:    This was a mention of 1 CT a few years ago but not on follow-up CTs.  No further imaging is indicated.  Glucose insufficiency: He is managing his diabetes followed by his primary provider.  Current medicines are reviewed at length with the patient today.  The patient does not have concerns regarding medicines.  The following changes have been made: None.  Labs/ tests ordered today include:   None  Orders Placed This Encounter  Procedures   EKG 12-Lead      Disposition:   FU with me in 12 months.     Signed, Minus Breeding, MD  02/19/2023 6:55 PM     [place]

## 2023-02-19 ENCOUNTER — Encounter: Payer: Self-pay | Admitting: Cardiology

## 2023-02-19 ENCOUNTER — Ambulatory Visit: Payer: Medicare HMO | Attending: Cardiology | Admitting: Cardiology

## 2023-02-19 VITALS — BP 112/66 | HR 76 | Ht 71.0 in | Wt 185.0 lb

## 2023-02-19 DIAGNOSIS — I2510 Atherosclerotic heart disease of native coronary artery without angina pectoris: Secondary | ICD-10-CM | POA: Diagnosis not present

## 2023-02-19 DIAGNOSIS — I319 Disease of pericardium, unspecified: Secondary | ICD-10-CM

## 2023-02-19 MED ORDER — NITROGLYCERIN 0.4 MG SL SUBL: 0.4000 mg | SUBLINGUAL_TABLET | SUBLINGUAL | 3 refills | Status: AC | PRN

## 2023-02-19 NOTE — Patient Instructions (Signed)
Medication Instructions:  STOP colchicine   *If you need a refill on your cardiac medications before your next appointment, please call your pharmacy*  Follow-Up: At [facility], you and your health needs are our priority.  As part of our continuing mission to provide you with exceptional heart care, we have created designated Provider Care Teams.  These Care Teams include your primary Cardiologist (physician) and Advanced Practice Providers (APPs -  Physician Assistants and Nurse Practitioners) who all work together to provide you with the care you need, when you need it.  We recommend signing up for the patient portal called "MyChart".  Sign up information is provided on this After Visit Summary.  MyChart is used to connect with patients for Virtual Visits (Telemedicine).  Patients are able to view lab/test results, encounter notes, upcoming appointments, etc.  Non-urgent messages can be sent to your provider as well.   To learn more about what you can do with MyChart, go to [email or web address].    Your next appointment:   12 month(s)  Provider:   Minus Breeding, MD

## 2023-02-20 ENCOUNTER — Other Ambulatory Visit: Payer: Self-pay | Admitting: Gastroenterology

## 2023-02-20 DIAGNOSIS — Z1211 Encounter for screening for malignant neoplasm of colon: Secondary | ICD-10-CM

## 2023-03-05 ENCOUNTER — Other Ambulatory Visit: Payer: Self-pay | Admitting: Cardiology

## 2023-03-22 ENCOUNTER — Other Ambulatory Visit: Payer: Self-pay | Admitting: Cardiology

## 2023-04-09 ENCOUNTER — Ambulatory Visit
Admission: RE | Admit: 2023-04-09 | Discharge: 2023-04-09 | Disposition: A | Discharge status: Home / Self Care | Payer: Medicare HMO | Source: Ambulatory Visit | Attending: Gastroenterology | Admitting: Gastroenterology

## 2023-04-09 DIAGNOSIS — Z1211 Encounter for screening for malignant neoplasm of colon: Secondary | ICD-10-CM

## 2023-05-08 IMAGING — CT CT ANGIO CHEST
3 of 8 series · 19 of 46 positions shown · IV contrast (OMNIPAQUE 350)
Comparison: 09/24/2019

CLINICAL DATA: Aortic root enlargement

EXAM:
CT ANGIOGRAPHY CHEST WITH CONTRAST
TECHNIQUE: Multidetector CT imaging of the chest was performed using the
standard protocol during bolus administration of intravenous
contrast. Multiplanar CT image reconstructions and MIPs were
obtained to evaluate the vascular anatomy.
CONTRAST:  100mL OMNIPAQUE IOHEXOL 350 MG/ML SOLN

[Series 4: aorta 3.0 bf37 2 · axial · 0.75mm/px · z∈[-344,-80]mm · 13 of 104 slices shown]
[im 8/104  lung]
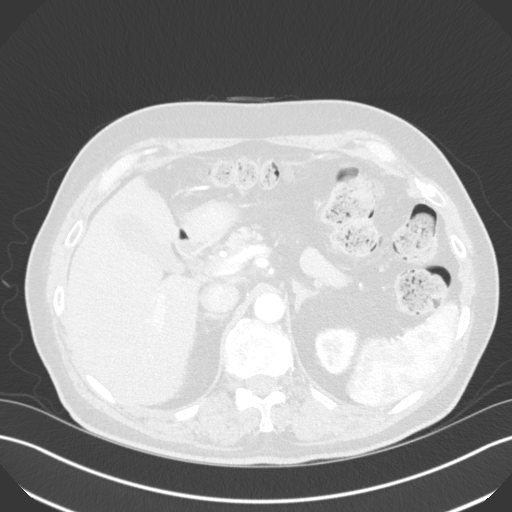
[im 15/104  soft-tissue]
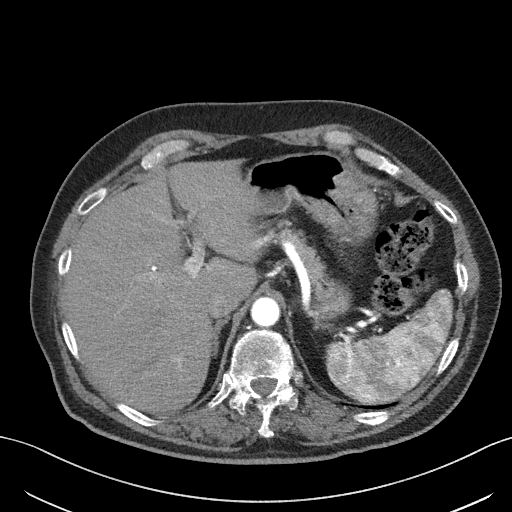
[im 23/104  lung]
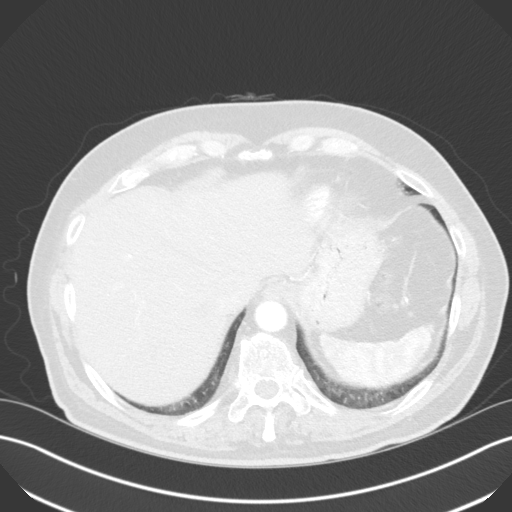
[im 30/104  soft-tissue]
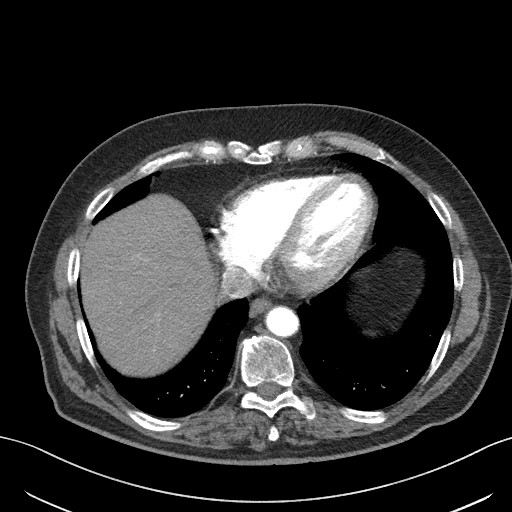
[im 37/104  lung]
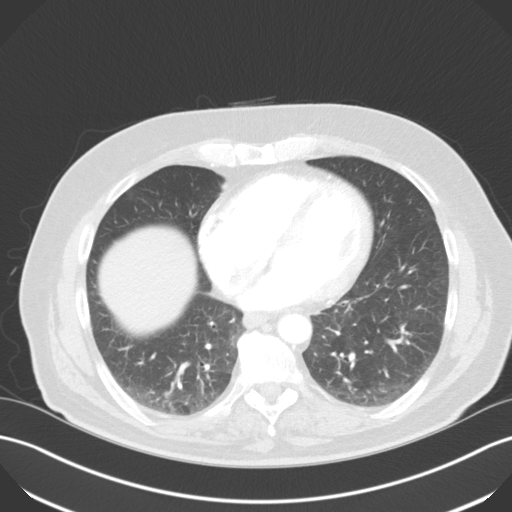
[im 45/104  soft-tissue]
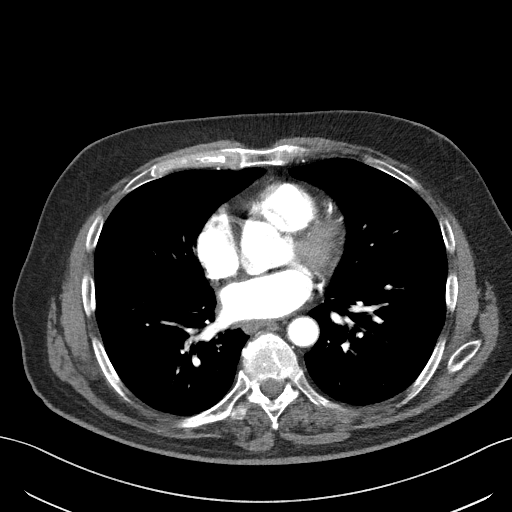
[im 52/104  lung]
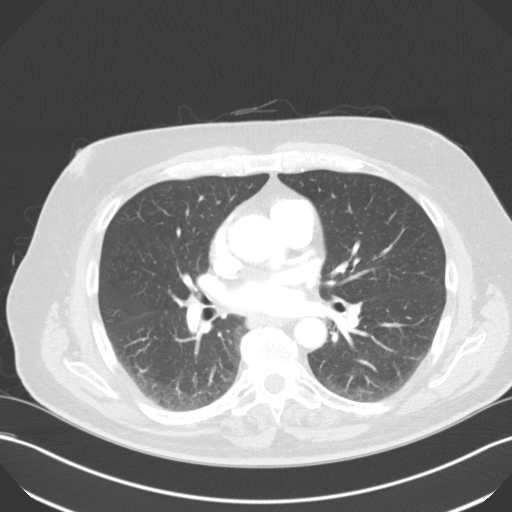
[im 59/104  soft-tissue]
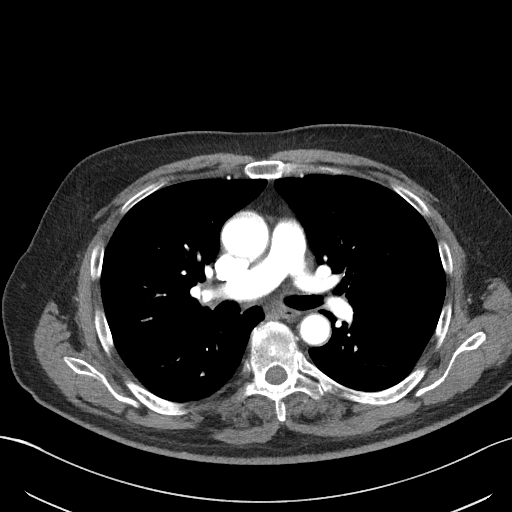
[im 67/104  lung]
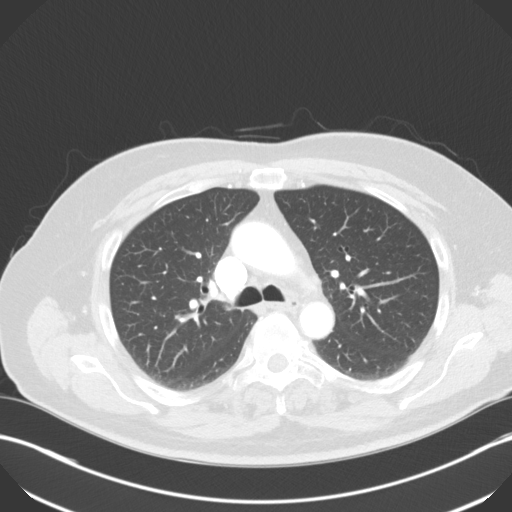
[im 74/104  soft-tissue]
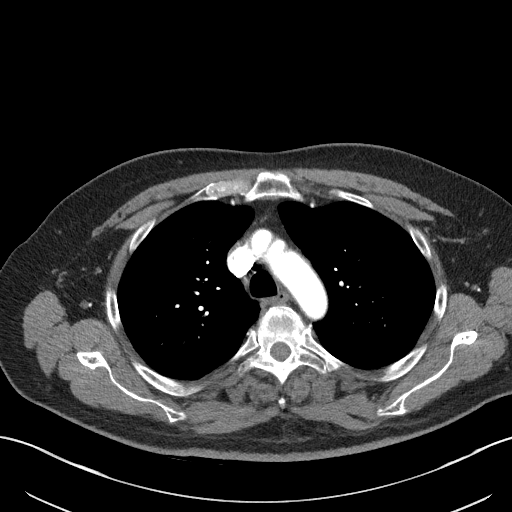
[im 81/104  lung]
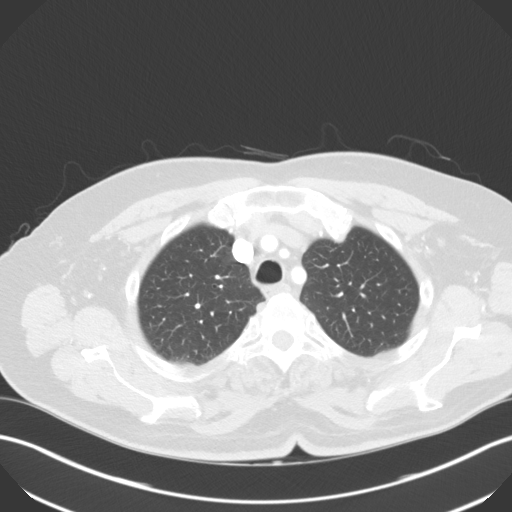
[im 89/104  soft-tissue]
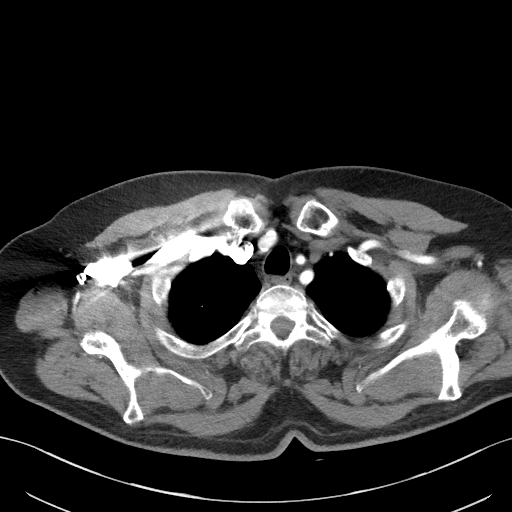
[im 96/104  lung]
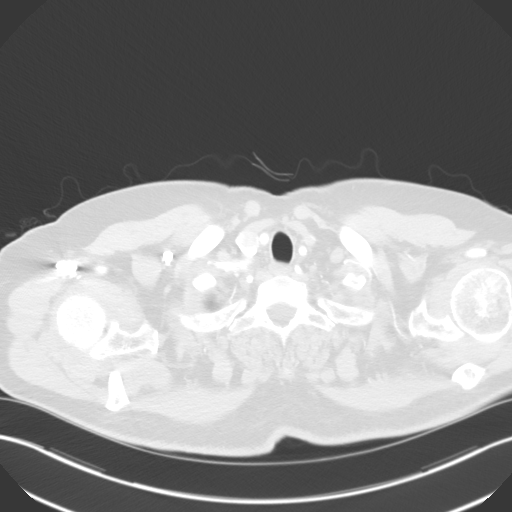

[Series 5: lung · axial · 0.75mm/px · z∈[-344,-258]mm · 3 of 104 slices shown]
[im 8/104  soft-tissue]
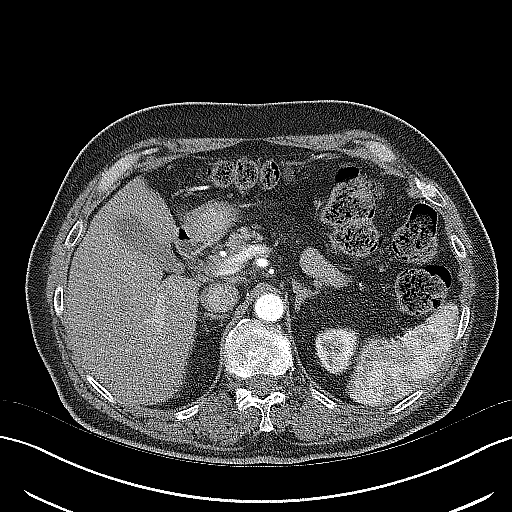
[im 23/104  soft-tissue]
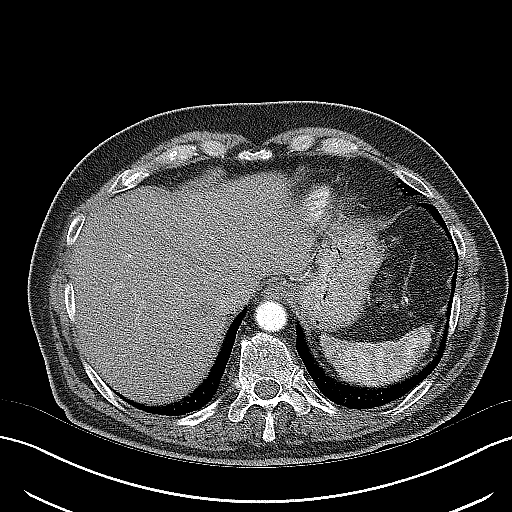
[im 37/104  soft-tissue]
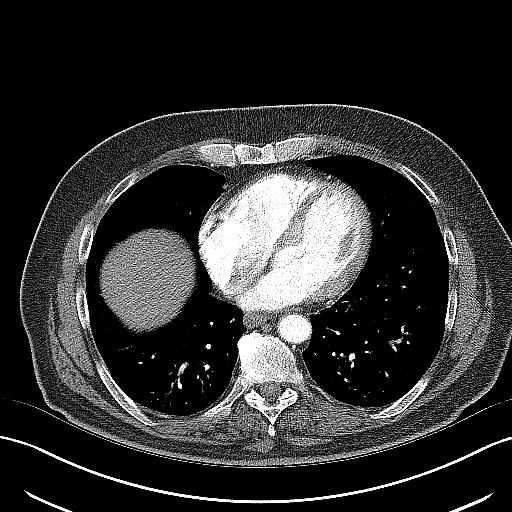

[Series 7: coronals · coronal · 0.64mm/px · 3 of 122 slices shown]
[im 31/122  soft-tissue]
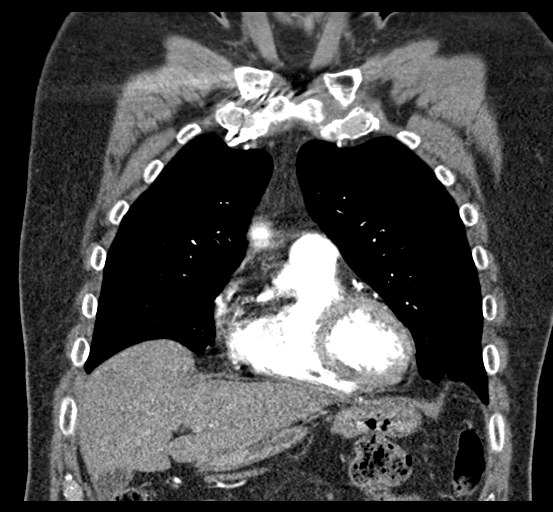
[im 61/122  soft-tissue]
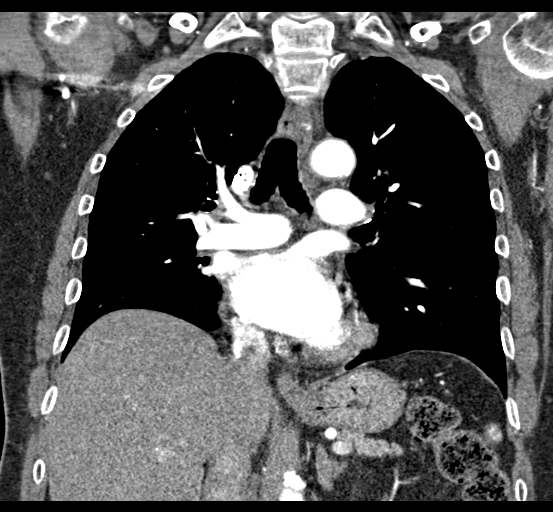
[im 91/122  soft-tissue]
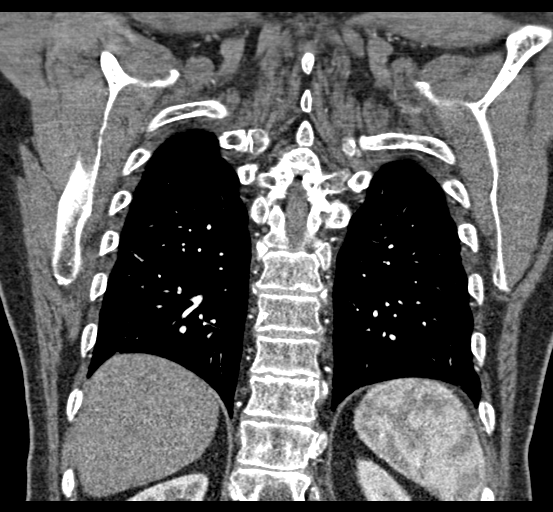

[19 of 46 positions shown; findings below may reference images not displayed]

FINDINGS: Cardiovascular: Stable aortic root diameter of 3.9 cm at the sinus
of Valsalva. Ascending aorta measures 3.4 cm. No significant
aneurysm. Thoracic aorta is normal in caliber. Minor atherosclerotic
change. Negative for acute dissection. Patent 3 vessel arch anatomy.

Central pulmonary arteries are patent and normal in caliber.
Negative for acute pulmonary embolus by CTA.

Normal heart size. Native coronary atherosclerosis noted. No
pericardial effusion.

Central venous structures are patent.  No Blankiss process.

Mediastinum/Nodes: No enlarged mediastinal, hilar, or axillary lymph
nodes. Thyroid gland, trachea, and esophagus demonstrate no
significant findings.

Lungs/Pleura: Lungs are clear. No pleural effusion or pneumothorax.

Upper Abdomen: No acute abnormality.

Musculoskeletal: Degenerative changes noted of the spine. No acute
osseous finding. Sternum intact.

Review of the MIP images confirms the above findings.
IMPRESSION: No significant thoracic aortic aneurysmal disease. Stable
measurements as above.

No other acute intrathoracic vascular or nonvascular finding.

Native coronary atherosclerosis

Aortic Atherosclerosis ([id number]-506.6).

## 2023-06-17 ENCOUNTER — Telehealth: Payer: Self-pay | Admitting: Cardiology

## 2023-06-17 NOTE — Telephone Encounter (Signed)
Miguel Bible would like to know if taking Voltaren gel would be ok to take with his current medications.

## 2023-06-17 NOTE — Telephone Encounter (Signed)
Pt c/o medication issue:  1. Name of Medication:   Voltaren  2. How are you currently taking this medication (dosage and times per day)?  Not started yet  3. Are you having a reaction (difficulty breathing--STAT)?   4. What is your medication issue?   Patient stated he was prescribed this medication for arthritis pain.  Patient wants to confirm he can use this medication.

## 2023-06-18 NOTE — Telephone Encounter (Signed)
Recommend Tylenol in general for pain management. Should avoid NSAIDs given history of CAD and HTN, also on Brilinta and aspirin. Would clarify if he was prescribed Voltaren gel or tablets - topical gel is ok to use as needed due to lower systemic absorption, would recommend avoiding oral Voltaren tablets.

## 2023-06-18 NOTE — Telephone Encounter (Signed)
Patient states it is 1% gel. He states understanding of information

## 2023-08-05 ENCOUNTER — Other Ambulatory Visit: Payer: Self-pay | Admitting: Cardiology

## 2023-08-26 ENCOUNTER — Other Ambulatory Visit: Payer: Self-pay | Admitting: Cardiology

## 2023-10-30 ENCOUNTER — Other Ambulatory Visit: Payer: Self-pay | Admitting: Cardiology

## 2023-11-22 ENCOUNTER — Telehealth: Payer: Self-pay | Admitting: Cardiology

## 2023-11-22 DIAGNOSIS — E785 Hyperlipidemia, unspecified: Secondary | ICD-10-CM

## 2023-11-22 DIAGNOSIS — I10 Essential (primary) hypertension: Secondary | ICD-10-CM

## 2023-11-22 NOTE — Telephone Encounter (Signed)
Patient wants to get orders for lab work to be done prior to his annual visit on 3/20.  Patient stated can call or leave message in MyChart.

## 2023-11-28 NOTE — Telephone Encounter (Signed)
Lab orders place for CBC, Lipid panel and BMET. Called patient with no answer. Left message on voicemail that labs were ordered. Will mail the orders to patient.

## 2024-02-12 ENCOUNTER — Other Ambulatory Visit: Payer: Self-pay | Admitting: Cardiology

## 2024-02-13 LAB — CBC WITH DIFFERENTIAL/PLATELET

## 2024-02-14 LAB — BASIC METABOLIC PANEL
BUN/Creatinine Ratio: 20 (ref 10–24)
BUN: 16 mg/dL (ref 8–27)
CO2: 25 mmol/L (ref 20–29)
Calcium: 9.5 mg/dL (ref 8.6–10.2)
Chloride: 100 mmol/L (ref 96–106)
Creatinine, Ser: 0.81 mg/dL (ref 0.76–1.27)
Glucose: 99 mg/dL (ref 70–99)
Potassium: 4.6 mmol/L (ref 3.5–5.2)
Sodium: 138 mmol/L (ref 134–144)
eGFR: 89 mL/min/{1.73_m2} (ref >59)

## 2024-02-14 LAB — CBC WITH DIFFERENTIAL/PLATELET
Basos: 1 %
EOS (ABSOLUTE): 0.1 10*3/uL (ref 0.0–0.2)
Eos: 2 %
Hematocrit: 45.2 % (ref 37.5–51.0)
Hemoglobin: 14.3 g/dL (ref 13.0–17.7)
Immature Granulocytes: 0 %
Immature Granulocytes: 0 10*3/uL (ref 0.0–0.1)
Lymphs: 36 %
MCH: 28.1 pg (ref 26.6–33.0)
MCHC: 31.6 g/dL (ref 31.5–35.7)
MCV: 89 fL (ref 79–97)
Monocytes Absolute: 0.1 10*3/uL (ref 0.0–0.4)
Monocytes Absolute: 0.4 10*3/uL (ref 0.1–0.9)
Monocytes: 8 %
Neutrophils Absolute: 1.9 10*3/uL (ref 0.7–3.1)
Neutrophils Absolute: 2.8 10*3/uL (ref 1.4–7.0)
Neutrophils: 53 %
Platelets: 215 10*3/uL (ref 150–450)
RBC: 5.09 x10E6/uL (ref 4.14–5.80)
RDW: 13.1 % (ref 11.6–15.4)
WBC: 5.2 10*3/uL (ref 3.4–10.8)

## 2024-02-14 LAB — LIPID PANEL
Chol/HDL Ratio: 2.6 ratio (ref 0.0–5.0)
Cholesterol, Total: 156 mg/dL (ref 100–199)
HDL: 61 mg/dL (ref >39)
LDL Chol Calc (NIH): 71 mg/dL (ref 0–99)
Triglycerides: 139 mg/dL (ref 0–149)
VLDL Cholesterol Cal: 24 mg/dL (ref 5–40)

## 2024-02-17 ENCOUNTER — Encounter: Payer: Self-pay | Admitting: *Deleted

## 2024-02-18 NOTE — Progress Notes (Unsigned)
 Cardiology Office Note:   Date:  02/20/2024  ID:  Miguel Walker, DOB Dec 07, 1940, MRN [id number] PCP: Farris Has, MD  [facility] Providers Cardiologist:  Rollene Rotunda, MD {  History of Present Illness:   Miguel Walker is a 83 y.o. male who is self referred for evaluation of an abnormal echo.   In 2020 an echo demonstrated very slight aortic valve gradient and an aortic root enlargement.  I followed up with a CT which demonstrated 38 - 39 mm enlargement.   He had an ST elevation MI.  He had anatomy as described below.  He had a mildly reduced EF.   He had 3rd degree AV block and had follow up with a Zio patch.   Follow up monitor did not demonstrate high grade stenosis.  At a previous visit I titrated spironolactone.  He does give me some results of the peripheral vascular screening that suggests right foot 0.77 ABI and left 0.57.  However, he has excellent pulses.  He has no resting pain.  He has no nonhealing ulcers.  He was admitted to the hospital June 29 through June 30 with atypical chest pain.  Troponin mildly elevated with flat trend.  Elevated CRP.  CTA chest with no PE though evidence of pericardial thickening versus pericardial effusion.  Echo EF 50 to 55%.  Started on colchicine for possible pericarditis.   Since he was last seen he denies any new symptoms. The patient denies any new symptoms such as chest discomfort, neck or arm discomfort. There has been no new shortness of breath, PND or orthopnea. There have been no reported palpitations, presyncope or syncope.  He is walking golfing and going to the gym.    Of note he did have 1 yeast infection on the [place] since I saw him.    ROS: As stated in the HPI and negative for all other systems.  Studies Reviewed:    EKG:   EKG Interpretation Date/Time:  Thursday February 20 2024 09:30:49 EDT Ventricular Rate:  72 PR Interval:  296 QRS Duration:  86 QT Interval:  386 QTC Calculation: 422 R Axis:   40  Text  Interpretation: Sinus rhythm with 1st degree A-V block When compared with ECG of 31-May-2022 11:59, T wave inversion less evident in Inferior leads Confirmed by Rollene Rotunda ([id number]) on 02/20/2024 9:47:52 AM    Risk Assessment/Calculations:              Physical Exam:   VS:  BP 100/60 (BP Location: Left Arm, Patient Position: Sitting, Cuff Size: Normal)   Pulse 74   Ht 5\' 11"  (1.803 m)   Wt 186 lb (84.4 kg)   SpO2 97%   BMI 25.94 kg/m    Wt Readings from Last 3 Encounters:  02/20/24 186 lb (84.4 kg)  02/19/23 185 lb (83.9 kg)  08/14/22 181 lb (82.1 kg)     GEN: Well nourished, well developed in no acute distress NECK: No JVD; No carotid bruits CARDIAC: RRR, which is what I always getno had so it is always worked murmurs, rubs, gallops RESPIRATORY:  Clear to auscultation without rales, wheezing or rhonchi  ABDOMEN: Soft, non-tender, non-distended EXTREMITIES:  No edema; No deformity   ASSESSMENT AND PLAN:   CAD:  The patient has no new sypmtoms.  No further cardiovascular testing is indicated.  We will continue with aggressive risk reduction and meds as listed.  ICM:   EF is low normal 50 to 55% with some mild inferior wall  hypokinesis.  This was checked in 2023.  No change in therapy.   Hypertension:    This is being managed in the context of treating his previous cardiomyopathy.   Hyperlipidemia:   LDL was 71.  HDL was excellent.  No change in therapy.    Glucose insufficiency: His A1c was up to 6.1.  I will defer to Farris Has, MD    Follow up with me in one year.   Signed, Rollene Rotunda, MD

## 2024-02-20 ENCOUNTER — Encounter: Payer: Self-pay | Admitting: Cardiology

## 2024-02-20 ENCOUNTER — Ambulatory Visit: Payer: Medicare HMO | Attending: Cardiology | Admitting: Cardiology

## 2024-02-20 VITALS — BP 100/60 | HR 74 | Ht 71.0 in | Wt 186.0 lb

## 2024-02-20 DIAGNOSIS — I25110 Atherosclerotic heart disease of native coronary artery with unstable angina pectoris: Secondary | ICD-10-CM | POA: Diagnosis not present

## 2024-02-20 DIAGNOSIS — E785 Hyperlipidemia, unspecified: Secondary | ICD-10-CM

## 2024-02-20 DIAGNOSIS — I319 Disease of pericardium, unspecified: Secondary | ICD-10-CM | POA: Diagnosis not present

## 2024-02-20 DIAGNOSIS — I255 Ischemic cardiomyopathy: Secondary | ICD-10-CM | POA: Diagnosis not present

## 2024-02-20 DIAGNOSIS — I7121 Aneurysm of the ascending aorta, without rupture: Secondary | ICD-10-CM

## 2024-02-20 NOTE — Patient Instructions (Signed)

## 2024-03-25 ENCOUNTER — Other Ambulatory Visit: Payer: Self-pay | Admitting: Medical Genetics

## 2024-03-31 ENCOUNTER — Other Ambulatory Visit: Payer: Self-pay

## 2024-03-31 DIAGNOSIS — Z006 Encounter for examination for normal comparison and control in clinical research program: Secondary | ICD-10-CM

## 2024-04-07 LAB — GENECONNECT MOLECULAR SCREEN: Genetic Analysis Overall Interpretation: NEGATIVE

## 2024-04-20 ENCOUNTER — Other Ambulatory Visit: Payer: Self-pay | Admitting: Cardiology

## 2024-06-03 ENCOUNTER — Other Ambulatory Visit: Payer: Self-pay | Admitting: Cardiology

## 2024-06-08 ENCOUNTER — Telehealth: Payer: Self-pay | Admitting: Cardiology

## 2024-06-08 NOTE — Telephone Encounter (Signed)
 Spoke with Pt. Pt states he started having hard stool in the past year along with a yeast infection that has since been treated and cleared.He has had back pain off and on the past 2 years. Pt states he had constipation for over 8 hours over the weekend, hard stool since and back pain since starting medication. Pt is requesting a change in medication as he feels this is side effects from the farxiga . Told pt I would send to Dr Lavona and PharmD for review. Pt stated understanding.   *He is going on vacation next Tuesday, send new medication to [facility] - [address].

## 2024-06-08 NOTE — Telephone Encounter (Signed)
 Pt c/o medication issue:  1. Name of Medication:   dapagliflozin  propanediol (FARXIGA ) 10 MG TABS tablet    2. How are you currently taking this medication (dosage and times per day)? As written  3. Are you having a reaction (difficulty breathing--STAT)? no  4. What is your medication issue? Diarrhea, yeast infection, back pain. Pt wants to know if he can stop this medication and be put on something else.

## 2024-06-10 NOTE — Telephone Encounter (Signed)
 Will forward again to PharmD and Dr Lavona for review and further instructions.

## 2024-06-10 NOTE — Telephone Encounter (Signed)
 Pt is calling checking the status of this message

## 2024-06-11 ENCOUNTER — Encounter: Payer: Self-pay | Admitting: Cardiology

## 2024-06-11 NOTE — Telephone Encounter (Signed)
 Spoke with patient and shared message from pharmacist: Constipation unlikely to be caused by Farxiga  although theoretically the medication could make issue worse if he is not drinking enough water. Other alternative would be Jardiance 10mg  however this medication has the same mechanism of action and side effect profile   Patient states he will try to take miralax and continue taking Farxiga  to see if this helps relieve/prevent constipation. He will call with update if no improvement. Patient expressed appreciation for follow-up.

## 2024-06-11 NOTE — Telephone Encounter (Signed)
 Patient is following up requesting feedback.

## 2024-06-11 NOTE — Telephone Encounter (Signed)
 Addressed, please see phone note from 06/08/24 for details.

## 2024-07-14 ENCOUNTER — Other Ambulatory Visit: Payer: Self-pay | Admitting: Cardiology

## 2024-10-07 ENCOUNTER — Other Ambulatory Visit: Payer: Self-pay | Admitting: Cardiology

## 2024-11-17 ENCOUNTER — Telehealth: Payer: Self-pay | Admitting: Cardiology

## 2024-11-17 NOTE — Telephone Encounter (Signed)
 Spoke with patient about Farxiga  side effects. C/o redness, fungus, decreased urination and slight pain on penis. Pt stopped Farxiga  and uses Clotrimazole and goes away. Then can restart medication, but symptoms return. Pt states this is the 4th time this year. Inquiring about stopping Farxiga  and switching to another medication. Will send to Dr Lavona and pharmacy to see if pt can switch medications. Pt verbalized understanding of plan.  Follow-up appointment made also.

## 2024-11-17 NOTE — Telephone Encounter (Signed)
 Pt c/o medication issue:  1. Name of Medication:   dapagliflozin  propanediol (FARXIGA ) 10 MG TABS tablet   2. How are you currently taking this medication (dosage and times per day)?   As prescribed  3. Are you having a reaction (difficulty breathing--STAT)?   Patient stated he had swelling of penis, redness, light bleeding of skin, fungus and pain  4. What is your medication issue?   Patient stated he had a bad  reaction to this medication and had to start using Clotrimazole 1% cream to stop the reaction.  Patient stated this is the 4th time this happened this year.  Patient stated he has stopped taking the medication and the symptoms went away.  Patient wants alternate medication.

## 2024-11-19 NOTE — Telephone Encounter (Signed)
 Spoke with pt and let him know that Dr. Lavona said he should stay off of the medication and that any drug in that class would have the same effect. Pt verbalized understanding. All questions if any were answered.

## 2024-12-08 ENCOUNTER — Ambulatory Visit: Attending: Family Medicine | Admitting: Physical Therapy

## 2024-12-08 ENCOUNTER — Other Ambulatory Visit: Payer: Self-pay

## 2024-12-08 ENCOUNTER — Encounter: Payer: Self-pay | Admitting: Physical Therapy

## 2024-12-08 DIAGNOSIS — R262 Difficulty in walking, not elsewhere classified: Secondary | ICD-10-CM | POA: Insufficient documentation

## 2024-12-08 DIAGNOSIS — M25651 Stiffness of right hip, not elsewhere classified: Secondary | ICD-10-CM | POA: Insufficient documentation

## 2024-12-08 DIAGNOSIS — M79659 Pain in unspecified thigh: Secondary | ICD-10-CM | POA: Diagnosis present

## 2024-12-08 DIAGNOSIS — M6281 Muscle weakness (generalized): Secondary | ICD-10-CM | POA: Insufficient documentation

## 2024-12-08 DIAGNOSIS — M79651 Pain in right thigh: Secondary | ICD-10-CM | POA: Insufficient documentation

## 2024-12-08 NOTE — Therapy (Signed)
 " OUTPATIENT PHYSICAL THERAPY LOWER EXTREMITY EVALUATION   Patient Name: Miguel Walker MRN: [id number] DOB:1941-03-27, 84 y.o., male Today's Date: 12/08/2024  END OF SESSION:  PT End of Session - 12/08/24 1015     Visit Number 1    Date for Recertification  02/02/25    Authorization Type [employer] no auth needed    Progress Note Due on Visit 10    PT Start Time 1016    PT Stop Time 1100    PT Time Calculation (min) 44 min    Activity Tolerance Patient tolerated treatment well          Past Medical History:  Diagnosis Date   Aneurysm, ascending aorta    Cancer (HCC) 1997   Prostate cancer   Coronary artery disease    Dyslipidemia    Family history of premature CAD    NO ISCHEMIC SYMPTOMS   Hyperlipidemia    Hypertension    Thyroid  nodule    Past Surgical History:  Procedure Laterality Date   CARDIAC CATHETERIZATION     CARPAL TUNNEL RELEASE     CORONARY/GRAFT ACUTE MI REVASCULARIZATION N/A 02/05/2022   Procedure: Coronary/Graft Acute MI Revascularization;  Surgeon: Anner Alm ORN, MD;  Location: [facility] INVASIVE CV LAB;  Service: Cardiovascular;  Laterality: N/A;   HAND SURGERY     INGUINAL HERNIA REPAIR Right 1982   in [place] - open w mesh   LEFT HEART CATH AND CORONARY ANGIOGRAPHY N/A 02/05/2022   Procedure: LEFT HEART CATH AND CORONARY ANGIOGRAPHY;  Surgeon: Anner Alm ORN, MD;  Location: [facility] INVASIVE CV LAB;  Service: Cardiovascular;  Laterality: N/A;   ROTATOR CUFF REPAIR     URINARY SPHINCTER IMPLANT  2019   Dr Prentice Endo, Madie Mouton   Patient Active Problem List   Diagnosis Date Noted   Pericarditis 08/12/2022   Atypical chest pain 05/31/2022   History of CAD (coronary artery disease) 05/31/2022   Chronic combined systolic and diastolic heart failure (HCC) 05/31/2022   Bradycardia 04/08/2022   Aortic root dilatation 04/08/2022   Essential hypertension 04/08/2022   Ischemic cardiomyopathy 02/08/2022   Coronary artery thrombosis with myocardial  infarction [facility]) 02/06/2022   Coronary artery disease involving native coronary artery of native heart with unstable angina pectoris (HCC) 02/06/2022   Heart block AV complete (HCC) 02/06/2022   Presence of drug coated stent in right coronary artery 02/06/2022   Acute ST elevation myocardial infarction (STEMI) of inferolateral wall (HCC) 02/05/2022   Nausea in adult 02/05/2022   Educated about COVID-19 virus infection 09/06/2020   Hyperlipidemia LDL goal <70 09/06/2020   Elevated coronary artery calcium  score 09/06/2020   Aortic root enlargement 09/06/2020   Nonspecific abnormal electrocardiogram (ECG) (EKG) 08/28/2019   Thoracic aortic aneurysm without rupture 08/28/2019    PCP: Kip Righter MD  REFERRING PROVIDER: Kip Righter MD  REFERRING DIAG: [phone] thigh pain  THERAPY DIAG:  Pain in right thigh  Muscle weakness (generalized)  Rationale for Evaluation and Treatment: Rehabilitation  ONSET DATE: 6 months ago  SUBJECTIVE:   SUBJECTIVE STATEMENT: 5-6 months started for no known injury; increased in pain,  present in the AM and with walks with my neighbor; onset with 1/2 mile walking;  sit 10 minutes and then numb pain goes away can do longer walking after sitting 3 to 3 1/2 miles;  when I play golf feel it but then recovers with sitting in the cart.  Happening more frequently; I drive to [place] to see family and when I  came home I feel stiffness in my back but otherwise no back pain;  Urologist checked for hernia and implant.   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 Yes NPRS scale: 0/10, AM the pain 4/10, 7-8/10 with walking Pain location: right anterior thigh Pain orientation: right numb pain  PAIN TYPE: aching Pain description: intermittent  Aggravating factors: walking; first thing in the AM; golf, leading with steps feels sluggish;church steps feels sluggish too Relieving factors: sitting   PRECAUTIONS: None   WEIGHT BEARING RESTRICTIONS: No  FALLS:  Has patient fallen in last 6 months? No  LIVING ENVIRONMENT:  Lives in: House/apartment Stairs: avoiding walking upstairs (bedroom downstairs)    PLOF: Independent  PATIENT GOALS: minimize this pain; build up this muscle over time  OBJECTIVE:  Note: Objective measures were completed at Evaluation unless otherwise noted.  DIAGNOSTIC FINDINGS: none  PATIENT SURVEYS:  LEFS  Extreme difficulty/unable (0), Quite a bit of difficulty (1), Moderate difficulty (2), Little difficulty (3), No difficulty (4) Survey date:   1/6  Any of your usual work, housework or school activities 4  2. Usual hobbies, recreational or sporting activities 2  3. Getting into/out of the bath 4  4. Walking between rooms 4  5. Putting on socks/shoes 4  6. Squatting  3  7. Lifting an object, like a bag of groceries from the floor 4  8. Performing light activities around your home 4  9. Performing heavy activities around your home 3  10. Getting into/out of a car 3  11. Walking 2 blocks 4  12. Walking 1 mile 3  13. Going up/down 10 stairs (1 flight) 3  14. Standing for 1 hour 3  15.  sitting for 1 hour 4  16. Running on even ground 2  17. Running on uneven ground 2  18. Making sharp turns while running fast 2  19. Hopping  2  20. Rolling over in bed 4  Score total:  64/80      COGNITION: Overall cognitive status: Within functional limits for tasks assessed     MUSCLE LENGTH: shortened hip flexor lengths bil right > left; shortened right distal  quads  POSTURE:  increased forward trunk lean  PALPATION: Tender points in proximal hip flexors  LOWER EXTREMITY ROM: decreased bil hip internal rotation right 0 degrees, left 5 degrees; decreased right external rotation 20 degrees  SPINAL ROM; Flexion WFLs, extension 75% limited, side bending 25% limited  LOWER EXTREMITY MMT:  MMT Right eval Left eval  Hip flexion 4 4  Hip extension 4 4  Hip abduction 4- 4  Hip adduction    Hip internal rotation    Hip external rotation 4- 4  Knee flexion    Knee extension 4 4  Ankle dorsiflexion    Ankle plantarflexion    Ankle inversion    Ankle eversion     (Blank rows = not tested)  LOWER EXTREMITY SPECIAL TESTS:   SLS LEFT 8 sec, RIGHT 15 sec  FUNCTIONAL TESTS:  Able  to rise sit to stand without UE assist Able to squat to pick up small object from the floor  GAIT:  Comments: decreased hip extension, forward trunk lean                                                                                                                                TREATMENT DATE: 12/08/24 evaluation  Initial HEP (hip flexor and quad lengthening) Discussed DN (pt had a good response in the past)   PATIENT EDUCATION:  Education details: Educated patient on anatomy and physiology of current symptoms, prognosis, plan of care as well as initial self care strategies to promote recovery Person educated: Patient Education method: Explanation Education comprehension: verbalized understanding  HOME EXERCISE PROGRAM: Access Code: [email or web address] URL: [email or web address] Date: 12/08/2024 Prepared by: Glade Pesa  Exercises - Hip Flexor Stretch at Edge of Bed  - 1 x daily - 7 x weekly - 1 sets - 2-3 reps - 30 hold - Prone Quadriceps Stretch with Strap  - 1 x daily - 7 x weekly - 1 sets - 3 reps - 30 hold - Half Kneeling Hip Flexor Stretch  - 1 x daily - 7 x weekly - 1 sets - 3 reps - 30 hold  ASSESSMENT:  CLINICAL IMPRESSION: Patient  is an 84 y.o. male who was seen today for physical therapy evaluation and treatment for right thigh pain for the past 5-6 months. Symptoms are worsened in the mornings, with walking 1/2 mile and feels weaker with ascending steps at home and church (tends to avoid them). His symptoms are relieved with sitting 10 minutes.  Decreased hip flexor and distal quad muscle lengths noted and decreased hip internal rotation ROM.  Weakness with hip abduction and external rotation strength.  He would benefit from skilled PT to address these deficits and limitations.   OBJECTIVE IMPAIRMENTS: decreased activity tolerance, decreased mobility, difficulty walking, decreased ROM, decreased strength, increased fascial restrictions, impaired perceived functional ability, and pain.   ACTIVITY LIMITATIONS: stairs and locomotion level  PARTICIPATION LIMITATIONS: driving, shopping, community activity, and golf  PERSONAL FACTORS: Time since onset of injury/illness/exacerbation are also affecting patient's functional outcome.   REHAB POTENTIAL: Good  CLINICAL DECISION MAKING: Stable/uncomplicated  EVALUATION COMPLEXITY: Low   GOALS: Goals reviewed with patient? Yes  SHORT TERM GOALS: Target date: 01/05/2025   The patient will demonstrate knowledge of basic self care strategies and exercises to promote healing  Baseline: Goal status: INITIAL  2.  The patient will report a 30% improvement in pain levels with functional activities which are currently difficult including mornings and with walking 1/2 mile Baseline:  Goal status: INITIAL  3.  Improve hip extension to 8 degrees needed for longer stride lengths with walking community distances Baseline:  Goal status: INITIAL  4.  The patient will have improved hip strength to at least 4/5 needed for standing, walking longer distances and ascending/descending stairs at home and in the community  Baseline:  Goal status: INITIAL   LONG TERM GOALS: Target date:  02/02/2025    The patient will be independent in a safe self progression of a home exercise program to promote further recovery of function  Baseline:  Goal status: INITIAL  2.  The patient will report a 60% improvement in pain levels with functional activities which are currently difficult including mornings and with walking 1/2 mile Baseline:  Goal status: INITIAL  3.   Improve hip extension to 10 degrees needed for longer stride lengths with walking community distances Baseline:  Goal status: INITIAL  4.  The patient will have improved hip strength to at least 4+/5 needed for standing, walking longer distances and ascending/descending stairs at home and in the community  Baseline:  Goal status: INITIAL  5.  LEFS improved to 69/70 indicating improved function with less pain Baseline:  Goal status: INITIAL    PLAN:  PT FREQUENCY: 2x/week  PT DURATION: 8 weeks  PLANNED INTERVENTIONS: [id number]- PT Re-evaluation, 97750- Physical Performance Testing, 97110-Therapeutic exercises, 97530- Therapeutic activity, 97112- Neuromuscular re-education, 97535- Self Care, [id number]- Manual therapy, [phone]- Aquatic Therapy, H9716- Electrical stimulation (unattended), [phone]- Electrical stimulation (manual), [id number]- Ultrasound, [id number]- Traction (mechanical), [id number]- Ionotophoresis 4mg /ml Dexamethasone , 79439 (1-2 muscles), 20561 (3+ muscles)- Dry Needling, Patient/Family education, Stair training, Taping, Joint mobilization, Spinal mobilization, Cryotherapy, and Moist heat  PLAN FOR NEXT SESSION: review hip flexor and quad lengthening; start glute medius strengthening; DN hip flexors  Glade Pesa, PT 12/08/2024 6:56 PM Phone: [phone] Fax: [phone]    "

## 2024-12-23 ENCOUNTER — Encounter: Payer: Self-pay | Admitting: Physical Therapy

## 2024-12-23 ENCOUNTER — Ambulatory Visit: Admitting: Physical Therapy

## 2024-12-23 DIAGNOSIS — M79651 Pain in right thigh: Secondary | ICD-10-CM

## 2024-12-23 DIAGNOSIS — R262 Difficulty in walking, not elsewhere classified: Secondary | ICD-10-CM

## 2024-12-23 DIAGNOSIS — M6281 Muscle weakness (generalized): Secondary | ICD-10-CM

## 2024-12-23 NOTE — Therapy (Signed)
 " OUTPATIENT PHYSICAL THERAPY LOWER EXTREMITY PROGRESS NOTE   Patient Name: Miguel Walker MRN: [id number] DOB:10-10-1941, 84 y.o., male Today's Date: 12/23/2024  END OF SESSION:  PT End of Session - 12/23/24 0930     Visit Number 2    Date for Recertification  02/02/25    Authorization Type [employer] no auth needed    Progress Note Due on Visit 10    PT Start Time 0930    PT Stop Time 1013    PT Time Calculation (min) 43 min    Activity Tolerance Patient tolerated treatment well          Past Medical History:  Diagnosis Date   Aneurysm, ascending aorta    Cancer (HCC) 1997   Prostate cancer   Coronary artery disease    Dyslipidemia    Family history of premature CAD    NO ISCHEMIC SYMPTOMS   Hyperlipidemia    Hypertension    Thyroid  nodule    Past Surgical History:  Procedure Laterality Date   CARDIAC CATHETERIZATION     CARPAL TUNNEL RELEASE     CORONARY/GRAFT ACUTE MI REVASCULARIZATION N/A 02/05/2022   Procedure: Coronary/Graft Acute MI Revascularization;  Surgeon: Anner Alm ORN, MD;  Location: [facility] INVASIVE CV LAB;  Service: Cardiovascular;  Laterality: N/A;   HAND SURGERY     INGUINAL HERNIA REPAIR Right 1982   in [place] - open w mesh   LEFT HEART CATH AND CORONARY ANGIOGRAPHY N/A 02/05/2022   Procedure: LEFT HEART CATH AND CORONARY ANGIOGRAPHY;  Surgeon: Anner Alm ORN, MD;  Location: [facility] INVASIVE CV LAB;  Service: Cardiovascular;  Laterality: N/A;   ROTATOR CUFF REPAIR     URINARY SPHINCTER IMPLANT  2019   Dr Prentice Endo, Madie Mouton   Patient Active Problem List   Diagnosis Date Noted   Pericarditis 08/12/2022   Atypical chest pain 05/31/2022   History of CAD (coronary artery disease) 05/31/2022   Chronic combined systolic and diastolic heart failure (HCC) 05/31/2022   Bradycardia 04/08/2022   Aortic root dilatation 04/08/2022   Essential hypertension 04/08/2022   Ischemic cardiomyopathy 02/08/2022   Coronary artery thrombosis with myocardial  infarction [facility]) 02/06/2022   Coronary artery disease involving native coronary artery of native heart with unstable angina pectoris (HCC) 02/06/2022   Heart block AV complete (HCC) 02/06/2022   Presence of drug coated stent in right coronary artery 02/06/2022   Acute ST elevation myocardial infarction (STEMI) of inferolateral wall (HCC) 02/05/2022   Nausea in adult 02/05/2022   Educated about COVID-19 virus infection 09/06/2020   Hyperlipidemia LDL goal <70 09/06/2020   Elevated coronary artery calcium  score 09/06/2020   Aortic root enlargement 09/06/2020   Nonspecific abnormal electrocardiogram (ECG) (EKG) 08/28/2019   Thoracic aortic aneurysm without rupture 08/28/2019    PCP: Kip Righter MD  REFERRING PROVIDER: Kip Righter MD  REFERRING DIAG: [phone] thigh pain  THERAPY DIAG:  Pain in right thigh  Muscle weakness (generalized)  Difficulty in walking, not elsewhere classified  Rationale for Evaluation and Treatment: Rehabilitation  ONSET DATE: 6 months ago  SUBJECTIVE:   SUBJECTIVE STATEMENT: I hung in on the golf.  My other leg (left) the thigh tightened up at night time.  It was ok while playing golf.  My legs feel heavy with stairs.  I'm playing golf on Thursday. I feel it when I wake up in the morning and my muscles loosen as the day goes on.    EVAL: 5-6 months started for no known injury;  increased in pain,  present in the AM and with walks with my neighbor; onset with 1/2 mile walking;  sit 10 minutes and then numb pain goes away can do longer walking after sitting 3 to 3 1/2 miles;  when I play golf feel it but then recovers with sitting in the cart.  Happening more frequently; I drive to [place] to see family and when I came home I feel stiffness in my back but otherwise no back pain;  Urologist checked for hernia and implant.   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 Yes NPRS scale: 0/10, AM the pain 4/10, 7-8/10 with walking Pain location: right anterior thigh Pain orientation: right numb pain  PAIN TYPE: aching Pain description: intermittent  Aggravating factors: walking; first thing in the AM; golf, leading with steps feels sluggish;church steps feels sluggish too Relieving factors: sitting   PRECAUTIONS: None   WEIGHT BEARING RESTRICTIONS: No  FALLS:  Has patient fallen in last 6 months? No  LIVING ENVIRONMENT:  Lives in: House/apartment Stairs: avoiding walking upstairs (bedroom downstairs)    PLOF: Independent  PATIENT GOALS: minimize this pain; build up this muscle over time  OBJECTIVE:  Note: Objective measures were completed at Evaluation unless otherwise noted.  DIAGNOSTIC FINDINGS: none  PATIENT SURVEYS:  LEFS  Extreme difficulty/unable (0), Quite a bit of difficulty (1), Moderate difficulty (2), Little difficulty (3), No difficulty (4) Survey date:   1/6  Any of your usual work, housework or school activities 4  2. Usual hobbies, recreational or sporting activities 2  3. Getting into/out of the bath 4  4. Walking between rooms 4  5. Putting on socks/shoes 4  6. Squatting  3  7. Lifting an object, like a bag of groceries from the floor 4  8. Performing light activities around your home 4  9. Performing heavy activities around your home 3  10. Getting into/out of a car 3  11. Walking 2 blocks 4  12. Walking 1 mile 3  13. Going up/down 10 stairs (1 flight) 3  14. Standing for 1 hour 3  15.  sitting for 1 hour 4  16. Running on even ground 2  17. Running on  uneven ground 2  18. Making sharp turns while running fast 2  19. Hopping  2  20. Rolling over in bed 4  Score total:  64/80      COGNITION: Overall cognitive status: Within functional limits for tasks assessed     MUSCLE LENGTH: shortened hip flexor lengths bil right > left; shortened right distal quads  POSTURE:  increased forward trunk lean  PALPATION: Tender points in proximal hip flexors  LOWER EXTREMITY ROM: decreased bil hip internal rotation right 0 degrees, left 5 degrees; decreased right external rotation 20 degrees  SPINAL ROM; Flexion WFLs, extension 75% limited, side bending 25% limited  LOWER EXTREMITY MMT:  MMT Right eval Left eval  Hip flexion 4 4  Hip extension 4 4  Hip abduction 4- 4  Hip adduction  Hip internal rotation    Hip external rotation 4- 4  Knee flexion    Knee extension 4 4  Ankle dorsiflexion    Ankle plantarflexion    Ankle inversion    Ankle eversion     (Blank rows = not tested)  LOWER EXTREMITY SPECIAL TESTS:   SLS LEFT 8 sec, RIGHT 15 sec  FUNCTIONAL TESTS:  Able to rise sit to stand without UE assist Able to squat to pick up small object from the floor  GAIT:  Comments: decreased hip extension, forward trunk lean                                                                                                                                TREATMENT DATE:  12/23/24: Review of status and response to HEP Manual right hip joint mobs: supine: grade 3/4 long leg axis distraction, inferior mobs with belt, PA in internal rotation 5x 20-30 sec each; prone: PA mobs, PA in internal and external rotation 5x 20-30 sec each; prone knee bends 20x right Prone quad stretch with strap right/left (left tighter) 10x Supine HS stretch with strap 30 sec holds 3x right/left Quadruped hip mobility exs: rocking with crossed ankles for external rotation and in internal rotation (Added to HEP- see below) Squats in front of the chair 10x (Added  to HEP- see below) Pt also plans to do the steps several times a day for strengthening      12/08/24 evaluation  Initial HEP (hip flexor and quad lengthening) Discussed DN (pt had a good response in the past)   PATIENT EDUCATION:  Education details: Educated patient on anatomy and physiology of current symptoms, prognosis, plan of care as well as initial self care strategies to promote recovery Person educated: Patient Education method: Explanation Education comprehension: verbalized understanding  HOME EXERCISE PROGRAM: Access Code: [email or web address] URL: [email or web address] Date: 12/23/2024 Prepared by: Glade Pesa  Exercises - Hip Flexor Stretch at Edge of Bed  - 1 x daily - 7 x weekly - 1 sets - 2-3 reps - 30 hold - Prone Quadriceps Stretch with Strap  - 1 x daily - 7 x weekly - 1 sets - 3 reps - 30 hold - Half Kneeling Hip Flexor Stretch  - 1 x daily - 7 x weekly - 1 sets - 3 reps - 30 hold - Supine Hamstring Stretch with Strap  - 1 x daily - 7 x weekly - 1 sets - 3 reps - 30 hold - Quadruped Rocking Slow  - 1 x daily - 7 x weekly - 1 sets - 10 reps - Squat with Chair Touch  - 2 x daily - 7 x weekly - 1 sets - 10 reps  ASSESSMENT:  CLINICAL IMPRESSION: Good response to hip mobilizations to address capsular tightness. Improved hip extension and internal rotation ROM noted following manual therapy.  Updated HEP with progressions and encouraged regular performance for best long term outcomes.  Therapist providing verbal cues to optimize technique with exercises in order to achieve the greatest benefit.     Eval Patient is an 84 y.o. male who was seen today for physical therapy evaluation and treatment for right thigh pain for the past 5-6 months. Symptoms are worsened in the mornings, with walking 1/2 mile and feels weaker with ascending steps at home and church (tends to avoid them). His symptoms are relieved with sitting 10 minutes.  Decreased hip flexor and distal  quad muscle lengths noted and decreased hip internal rotation ROM.  Weakness with hip abduction and external rotation strength.  He would benefit from skilled PT to address these deficits and limitations.   OBJECTIVE IMPAIRMENTS: decreased activity tolerance, decreased mobility, difficulty walking, decreased ROM, decreased strength, increased fascial restrictions, impaired perceived functional ability, and pain.   ACTIVITY LIMITATIONS: stairs and locomotion level  PARTICIPATION LIMITATIONS: driving, shopping, community activity, and golf  PERSONAL FACTORS: Time since onset of injury/illness/exacerbation are also affecting patient's functional outcome.   REHAB POTENTIAL: Good  CLINICAL DECISION MAKING: Stable/uncomplicated  EVALUATION COMPLEXITY: Low   GOALS: Goals reviewed with patient? Yes  SHORT TERM GOALS: Target date: 01/05/2025   The patient will demonstrate knowledge of basic self care strategies and exercises to promote healing  Baseline: Goal status: INITIAL  2.  The patient will report a 30% improvement in pain levels with functional activities which are currently difficult including mornings and with walking 1/2 mile Baseline:  Goal status: INITIAL  3.  Improve hip extension to 8 degrees needed for longer stride lengths with walking community distances Baseline:  Goal status: INITIAL  4.  The patient will have improved hip strength to at least 4/5 needed for standing, walking longer distances and ascending/descending stairs at home and in the community Baseline:  Goal status: INITIAL   LONG TERM GOALS: Target date: 02/02/2025    The patient will be independent in a safe self progression of a home exercise program to promote further recovery of function  Baseline:  Goal status: INITIAL  2.  The patient will report a 60% improvement in pain levels with functional activities which are currently difficult including mornings and with walking 1/2 mile Baseline:  Goal  status: INITIAL  3.   Improve hip extension to 10 degrees needed for longer stride lengths with walking community distances Baseline:  Goal status: INITIAL  4.  The patient will have improved hip strength to at least 4+/5 needed for standing, walking longer distances and ascending/descending stairs at home and in the community  Baseline:  Goal status: INITIAL  5.  LEFS improved to 69/70 indicating improved function with less pain Baseline:  Goal status: INITIAL    PLAN:  PT FREQUENCY: 2x/week  PT DURATION: 8 weeks  PLANNED INTERVENTIONS: 97164- PT Re-evaluation, 97750- Physical Performance Testing, 97110-Therapeutic exercises, 97530- Therapeutic activity, 97112- Neuromuscular re-education, 97535- Self Care, [id number]- Manual therapy, [phone]- Aquatic Therapy, H9716- Electrical stimulation (unattended), [phone]- Electrical stimulation (manual), [id number]- Ultrasound, [id number]- Traction (mechanical), [id number]- Ionotophoresis 4mg /ml Dexamethasone , 20560 (1-2 muscles), 20561 (3+ muscles)- Dry Needling, Patient/Family education, Stair training, Taping, Joint mobilization, Spinal mobilization, Cryotherapy, and Moist heat  PLAN FOR NEXT SESSION: hip mobilizations;  hip flexor and quad lengthening;  glute medius strengthening; DN hip flexors  Glade Pesa, PT 12/23/24 6:53 PM Phone: [phone] Fax: [phone]    "

## 2024-12-25 ENCOUNTER — Encounter: Payer: Self-pay | Admitting: Physical Therapy

## 2024-12-25 ENCOUNTER — Ambulatory Visit: Admitting: Physical Therapy

## 2024-12-25 DIAGNOSIS — M79651 Pain in right thigh: Secondary | ICD-10-CM | POA: Diagnosis not present

## 2024-12-25 DIAGNOSIS — M6281 Muscle weakness (generalized): Secondary | ICD-10-CM

## 2024-12-25 DIAGNOSIS — R262 Difficulty in walking, not elsewhere classified: Secondary | ICD-10-CM

## 2024-12-25 NOTE — Therapy (Signed)
 " OUTPATIENT PHYSICAL THERAPY LOWER EXTREMITY PROGRESS NOTE   Patient Name: Miguel Walker MRN: [id number] DOB:Feb 20, 1941, 84 y.o., male Today's Date: 12/25/2024  END OF SESSION:  PT End of Session - 12/25/24 0933     Visit Number 3    Date for Recertification  02/02/25    Authorization Type [employer] no auth needed    Progress Note Due on Visit 10    PT Start Time 0933    PT Stop Time 1014    PT Time Calculation (min) 41 min    Activity Tolerance Patient tolerated treatment well          Past Medical History:  Diagnosis Date   Aneurysm, ascending aorta    Cancer (HCC) 1997   Prostate cancer   Coronary artery disease    Dyslipidemia    Family history of premature CAD    NO ISCHEMIC SYMPTOMS   Hyperlipidemia    Hypertension    Thyroid  nodule    Past Surgical History:  Procedure Laterality Date   CARDIAC CATHETERIZATION     CARPAL TUNNEL RELEASE     CORONARY/GRAFT ACUTE MI REVASCULARIZATION N/A 02/05/2022   Procedure: Coronary/Graft Acute MI Revascularization;  Surgeon: Anner Alm ORN, MD;  Location: [facility];  Service: Cardiovascular;  Laterality: N/A;   HAND SURGERY     INGUINAL HERNIA REPAIR Right 1982   in [place] - open w mesh   LEFT HEART CATH AND CORONARY ANGIOGRAPHY N/A 02/05/2022   Procedure: LEFT HEART CATH AND CORONARY ANGIOGRAPHY;  Surgeon: Anner Alm ORN, MD;  Location: [facility];  Service: Cardiovascular;  Laterality: N/A;   ROTATOR CUFF REPAIR     URINARY SPHINCTER IMPLANT  2019   Dr Prentice Endo, [facility]outon   Patient Active Problem List   Diagnosis Date Noted   Pericarditis 08/12/2022   Atypical chest pain 05/31/2022   History of CAD (coronary artery disease) 05/31/2022   Chronic combined systolic and diastolic heart failure (HCC) 05/31/2022   Bradycardia 04/08/2022   Aortic root dilatation 04/08/2022   Essential hypertension 04/08/2022   Ischemic cardiomyopathy 02/08/2022   Coronary artery thrombosis with myocardial  infarction [facility]) 02/06/2022   Coronary artery disease involving native coronary artery of native heart with unstable angina pectoris (HCC) 02/06/2022   Heart block AV complete (HCC) 02/06/2022   Presence of drug coated stent in right coronary artery 02/06/2022   Acute ST elevation myocardial infarction (STEMI) of inferolateral wall (HCC) 02/05/2022   Nausea in adult 02/05/2022   Educated about COVID-19 virus infection 09/06/2020   Hyperlipidemia LDL goal <70 09/06/2020   Elevated coronary artery calcium  score 09/06/2020   Aortic root enlargement 09/06/2020   Nonspecific abnormal electrocardiogram (ECG) (EKG) 08/28/2019   Thoracic aortic aneurysm without rupture 08/28/2019    PCP: Kip Righter MD  REFERRING PROVIDER: Kip Righter MD  REFERRING DIAG: [phone] thigh pain  THERAPY DIAG:  Pain in right thigh  Muscle weakness (generalized)  Difficulty in walking, not elsewhere classified  Rationale for Evaluation and Treatment: Rehabilitation  ONSET DATE: 6 months ago  SUBJECTIVE:   SUBJECTIVE STATEMENT: I played golf yesterday. My legs didn't feel sluggish.  No pain in either leg.  The hip (mobs) you did last time helped.  Maybe DN next week?   EVAL: 5-6 months started for no known injury; increased in pain,  present in the AM and with walks with my neighbor; onset with 1/2 mile walking;  sit 10 minutes and then numb pain goes away  can do longer walking after sitting 3 to 3 1/2 miles;  when I play golf feel it but then recovers with sitting in the cart.  Happening more frequently; I drive to [place] to see family and when I came home I feel stiffness in my back but otherwise no back pain;  Urologist checked for hernia and implant.   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 Yes NPRS scale: 0/10, AM the pain 4/10, 7-8/10 with walking Pain location: right anterior thigh Pain orientation: right numb pain  PAIN TYPE: aching Pain description: intermittent  Aggravating factors: walking; first thing in the AM; golf, leading with steps feels sluggish;church steps feels sluggish too Relieving factors: sitting   PRECAUTIONS: None   WEIGHT BEARING RESTRICTIONS: No  FALLS:  Has patient fallen in last 6 months? No  LIVING ENVIRONMENT:  Lives in: House/apartment Stairs: avoiding walking upstairs (bedroom downstairs)    PLOF: Independent  PATIENT GOALS: minimize this pain; build up this muscle over time  OBJECTIVE:  Note: Objective measures were completed at Evaluation unless otherwise noted.  DIAGNOSTIC FINDINGS: none  PATIENT SURVEYS:  LEFS  Extreme difficulty/unable (0), Quite a bit of difficulty (1), Moderate difficulty (2), Little difficulty (3), No difficulty (4) Survey date:   1/6  Any of your usual work, housework or school activities 4  2. Usual hobbies, recreational or sporting activities 2  3. Getting into/out of the bath 4  4. Walking between rooms 4  5. Putting on socks/shoes 4  6. Squatting  3  7. Lifting an object, like a bag of groceries from the floor 4  8. Performing light activities around your home 4  9. Performing heavy activities around your home 3  10. Getting into/out of a car 3  11. Walking 2 blocks 4  12. Walking 1 mile 3  13. Going up/down 10 stairs (1 flight) 3  14. Standing for 1 hour 3  15.  sitting for 1 hour 4  16. Running on even ground 2  17. Running on uneven ground 2  18. Making sharp turns while running fast 2  19. Hopping  2  20. Rolling over in bed 4  Score  total:  64/80      COGNITION: Overall cognitive status: Within functional limits for tasks assessed     MUSCLE LENGTH: shortened hip flexor lengths bil right > left; shortened right distal quads  POSTURE:  increased forward trunk lean  PALPATION: Tender points in proximal hip flexors  LOWER EXTREMITY ROM: decreased bil hip internal rotation right 0 degrees, left 5 degrees; decreased right external rotation 20 degrees  SPINAL ROM; Flexion WFLs, extension 75% limited, side bending 25% limited  LOWER EXTREMITY MMT:  MMT Right eval Left eval  Hip flexion 4 4  Hip extension 4 4  Hip abduction 4- 4  Hip adduction    Hip internal rotation    Hip external rotation 4- 4  Knee flexion    Knee extension 4 4  Ankle dorsiflexion  Ankle plantarflexion    Ankle inversion    Ankle eversion     (Blank rows = not tested)  LOWER EXTREMITY SPECIAL TESTS:   SLS LEFT 8 sec, RIGHT 15 sec  FUNCTIONAL TESTS:  Able to rise sit to stand without UE assist Able to squat to pick up small object from the floor  GAIT:  Comments: decreased hip extension, forward trunk lean                                                                                                                                TREATMENT DATE:  12/25/24: Review of status and response to treatment  Manual right and left hip joint mobs: supine: grade 3/4 long leg axis distraction, inferior mobs with belt, PA in internal rotation 5x 20-30 sec each; prone: PA mobs, PA in internal and external rotation 5x 20-30 sec each; prone knee bends 20x right and left Standing hip mobility 1/2 circles over hurdle each way right/left 10x Standing circles around tall cone 10x right/left Pt has upcoming trip to [place]-discussed his stretching routine  Review of HEP   12/23/24: Review of status and response to HEP Manual right hip joint mobs: supine: grade 3/4 long leg axis distraction, inferior mobs with belt, PA in internal rotation 5x  20-30 sec each; prone: PA mobs, PA in internal and external rotation 5x 20-30 sec each; prone knee bends 20x right Prone quad stretch with strap right/left (left tighter) 10x Supine HS stretch with strap 30 sec holds 3x right/left Quadruped hip mobility exs: rocking with crossed ankles for external rotation and in internal rotation (Added to HEP- see below) Squats in front of the chair 10x (Added to HEP- see below) Pt also plans to do the steps several times a day for strengthening      12/08/24 evaluation  Initial HEP (hip flexor and quad lengthening) Discussed DN (pt had a good response in the past)   PATIENT EDUCATION:  Education details: Educated patient on anatomy and physiology of current symptoms, prognosis, plan of care as well as initial self care strategies to promote recovery Person educated: Patient Education method: Explanation Education comprehension: verbalized understanding  HOME EXERCISE PROGRAM: Access Code: [email or web address] URL: [email or web address] Date: 12/23/2024 Prepared by: Glade Pesa  Exercises - Hip Flexor Stretch at [facility] of Bed  - 1 x daily - 7 x weekly - 1 sets - 2-3 reps - 30 hold - Prone Quadriceps Stretch with Strap  - 1 x daily - 7 x weekly - 1 sets - 3 reps - 30 hold - Half Kneeling Hip Flexor Stretch  - 1 x daily - 7 x weekly - 1 sets - 3 reps - 30 hold - Supine Hamstring Stretch with Strap  - 1 x daily - 7 x weekly - 1 sets - 3 reps - 30 hold - Quadruped Rocking Slow  - 1 x daily - 7 x weekly - 1 sets - 10  reps - Squat with Chair Touch  - 2 x daily - 7 x weekly - 1 sets - 10 reps  ASSESSMENT:  CLINICAL IMPRESSION: Much improved right hip mobility noted with left hip mobility more limited than right today.  He responds well to manual techniques and exercises to promote more motion.  Therapist providing verbal cues to optimize technique with exercises and to monitor response.     Eval Patient is an 84 y.o. male who was seen today  for physical therapy evaluation and treatment for right thigh pain for the past 5-6 months. Symptoms are worsened in the mornings, with walking 1/2 mile and feels weaker with ascending steps at home and church (tends to avoid them). His symptoms are relieved with sitting 10 minutes.  Decreased hip flexor and distal quad muscle lengths noted and decreased hip internal rotation ROM.  Weakness with hip abduction and external rotation strength.  He would benefit from skilled PT to address these deficits and limitations.   OBJECTIVE IMPAIRMENTS: decreased activity tolerance, decreased mobility, difficulty walking, decreased ROM, decreased strength, increased fascial restrictions, impaired perceived functional ability, and pain.   ACTIVITY LIMITATIONS: stairs and locomotion level  PARTICIPATION LIMITATIONS: driving, shopping, community activity, and golf  PERSONAL FACTORS: Time since onset of injury/illness/exacerbation are also affecting patient's functional outcome.   REHAB POTENTIAL: Good  CLINICAL DECISION MAKING: Stable/uncomplicated  EVALUATION COMPLEXITY: Low   GOALS: Goals reviewed with patient? Yes  SHORT TERM GOALS: Target date: 01/05/2025   The patient will demonstrate knowledge of basic self care strategies and exercises to promote healing  Baseline: Goal status: INITIAL  2.  The patient will report a 30% improvement in pain levels with functional activities which are currently difficult including mornings and with walking 1/2 mile Baseline:  Goal status: INITIAL  3.  Improve hip extension to 8 degrees needed for longer stride lengths with walking community distances Baseline:  Goal status: INITIAL  4.  The patient will have improved hip strength to at least 4/5 needed for standing, walking longer distances and ascending/descending stairs at home and in the community Baseline:  Goal status: INITIAL   LONG TERM GOALS: Target date: 02/02/2025    The patient will be  independent in a safe self progression of a home exercise program to promote further recovery of function  Baseline:  Goal status: INITIAL  2.  The patient will report a 60% improvement in pain levels with functional activities which are currently difficult including mornings and with walking 1/2 mile Baseline:  Goal status: INITIAL  3.   Improve hip extension to 10 degrees needed for longer stride lengths with walking community distances Baseline:  Goal status: INITIAL  4.  The patient will have improved hip strength to at least 4+/5 needed for standing, walking longer distances and ascending/descending stairs at home and in the community  Baseline:  Goal status: INITIAL  5.  LEFS improved to 69/70 indicating improved function with less pain Baseline:  Goal status: INITIAL    PLAN:  PT FREQUENCY: 2x/week  PT DURATION: 8 weeks  PLANNED INTERVENTIONS: 97164- PT Re-evaluation, 97750- Physical Performance Testing, 97110-Therapeutic exercises, 97530- Therapeutic activity, 97112- Neuromuscular re-education, 97535- Self Care, [id number]- Manual therapy, [id number]- Aquatic Therapy, H9716- Electrical stimulation (unattended), [phone]- Electrical stimulation (manual), [id number]- Ultrasound, [id number]- Traction (mechanical), [id number]- Ionotophoresis 4mg /ml Dexamethasone , 79439 (1-2 muscles), 20561 (3+ muscles)- Dry Needling, Patient/Family education, Stair training, Taping, Joint mobilization, Spinal mobilization, Cryotherapy, and Moist heat  PLAN FOR NEXT SESSION: hip mobilizations;  hip flexor  and quad lengthening;  glute medius strengthening; DN as needed to hip flexors/quads  Glade Pesa, PT 12/25/24 12:06 PM Phone: [phone] Fax: [phone]    "

## 2024-12-30 ENCOUNTER — Ambulatory Visit: Admitting: Physical Therapy

## 2024-12-30 ENCOUNTER — Encounter: Payer: Self-pay | Admitting: Physical Therapy

## 2024-12-30 DIAGNOSIS — M25651 Stiffness of right hip, not elsewhere classified: Secondary | ICD-10-CM

## 2024-12-30 DIAGNOSIS — M6281 Muscle weakness (generalized): Secondary | ICD-10-CM

## 2024-12-30 DIAGNOSIS — M79651 Pain in right thigh: Secondary | ICD-10-CM

## 2024-12-30 DIAGNOSIS — R262 Difficulty in walking, not elsewhere classified: Secondary | ICD-10-CM

## 2024-12-30 NOTE — Patient Instructions (Signed)

## 2024-12-30 NOTE — Therapy (Signed)
 " OUTPATIENT PHYSICAL THERAPY LOWER EXTREMITY PROGRESS NOTE   Patient Name: Miguel Walker MRN: [id number] DOB:1941/08/03, 84 y.o., male Today's Date: 12/30/2024  END OF SESSION:  PT End of Session - 12/30/24 0944     Visit Number 4    Date for Recertification  02/02/25    Authorization Type [employer] no auth needed DN paper signed for Jan    Progress Note Due on Visit 10    PT Start Time 0918    PT Stop Time 1000    PT Time Calculation (min) 42 min    Activity Tolerance Patient tolerated treatment well          Past Medical History:  Diagnosis Date   Aneurysm, ascending aorta    Cancer (HCC) 1997   Prostate cancer   Coronary artery disease    Dyslipidemia    Family history of premature CAD    NO ISCHEMIC SYMPTOMS   Hyperlipidemia    Hypertension    Thyroid  nodule    Past Surgical History:  Procedure Laterality Date   CARDIAC CATHETERIZATION     CARPAL TUNNEL RELEASE     CORONARY/GRAFT ACUTE MI REVASCULARIZATION N/A 02/05/2022   Procedure: Coronary/Graft Acute MI Revascularization;  Surgeon: Anner Alm ORN, MD;  Location: [facility] INVASIVE [facility];  Service: Cardiovascular;  Laterality: N/A;   HAND SURGERY     INGUINAL HERNIA REPAIR Right 1982   in [place] - open w mesh   LEFT HEART CATH AND CORONARY ANGIOGRAPHY N/A 02/05/2022   Procedure: LEFT HEART CATH AND CORONARY ANGIOGRAPHY;  Surgeon: Anner Alm ORN, MD;  Location: [facility] INVASIVE CV LAB;  Service: Cardiovascular;  Laterality: N/A;   ROTATOR CUFF REPAIR     URINARY SPHINCTER IMPLANT  2019   Dr Prentice Endo, Madie Mouton   Patient Active Problem List   Diagnosis Date Noted   Pericarditis 08/12/2022   Atypical chest pain 05/31/2022   History of CAD (coronary artery disease) 05/31/2022   Chronic combined systolic and diastolic heart failure (HCC) 05/31/2022   Bradycardia 04/08/2022   Aortic root dilatation 04/08/2022   Essential hypertension 04/08/2022   Ischemic cardiomyopathy 02/08/2022   Coronary artery  thrombosis with myocardial infarction [facility]) 02/06/2022   Coronary artery disease involving native coronary artery of native heart with unstable angina pectoris (HCC) 02/06/2022   Heart block AV complete (HCC) 02/06/2022   Presence of drug coated stent in right coronary artery 02/06/2022   Acute ST elevation myocardial infarction (STEMI) of inferolateral wall (HCC) 02/05/2022   Nausea in adult 02/05/2022   Educated about COVID-19 virus infection 09/06/2020   Hyperlipidemia LDL goal <70 09/06/2020   Elevated coronary artery calcium  score 09/06/2020   Aortic root enlargement 09/06/2020   Nonspecific abnormal electrocardiogram (ECG) (EKG) 08/28/2019   Thoracic aortic aneurysm without rupture 08/28/2019    PCP: Kip Righter MD  REFERRING PROVIDER: Kip Righter MD  REFERRING DIAG: [phone] thigh pain  THERAPY DIAG:  Pain in right thigh  Muscle weakness (generalized)  Difficulty in walking, not elsewhere classified  Stiffness of right hip, not elsewhere classified  Rationale for Evaluation and Treatment: Rehabilitation  ONSET DATE: 6 months ago  SUBJECTIVE:   SUBJECTIVE STATEMENT: Feeling good.  My recovery is good in the mornings. I can lift my legs higher when I get dressed without leaning. I would like to do needling though bc I still feel tightness front thigh.  I push myself with the exercises.    EVAL: 5-6 months started for no known injury; increased  in pain,  present in the AM and with walks with my neighbor; onset with 1/2 mile walking;  sit 10 minutes and then numb pain goes away can do longer walking after sitting 3 to 3 1/2 miles;  when I play golf feel it but then recovers with sitting in the cart.  Happening more frequently; I drive to [place] to see family and when I came home I feel stiffness in my back but otherwise no back pain;  Urologist checked for hernia and implant.   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 Yes NPRS scale: 0/10 Pain location: right anterior thigh Pain orientation: right numb pain  PAIN TYPE: aching Pain description: intermittent  Aggravating factors: walking; first thing in the AM; golf, leading with steps feels sluggish;church steps feels sluggish too Relieving factors: sitting   PRECAUTIONS: None   WEIGHT BEARING RESTRICTIONS: No  FALLS:  Has patient fallen in last 6 months? No  LIVING ENVIRONMENT:  Lives in: House/apartment Stairs: avoiding walking upstairs (bedroom downstairs)    PLOF: Independent  PATIENT GOALS: minimize this pain; build up this muscle over time  OBJECTIVE:  Note: Objective measures were completed at Evaluation unless otherwise noted.  DIAGNOSTIC FINDINGS: none  PATIENT SURVEYS:  LEFS  Extreme difficulty/unable (0), Quite a bit of difficulty (1), Moderate difficulty (2), Little difficulty (3), No difficulty (4) Survey date:   1/6  Any of your usual work, housework or school activities 4  2. Usual hobbies, recreational or sporting activities 2  3. Getting into/out of the bath 4  4. Walking between rooms 4  5. Putting on socks/shoes 4  6. Squatting  3  7. Lifting an object, like a bag of groceries from the floor 4  8. Performing light activities around your home 4  9. Performing heavy activities around your home 3  10. Getting into/out of a car 3  11. Walking 2 blocks 4  12. Walking 1 mile 3  13. Going up/down 10 stairs (1 flight) 3  14. Standing for 1 hour 3  15.  sitting for 1 hour 4  16. Running on even ground 2  17.  Running on uneven ground 2  18. Making sharp turns while running fast 2  19. Hopping  2  20. Rolling over in bed 4  Score total:  64/80      COGNITION: Overall cognitive status: Within functional limits for tasks assessed     MUSCLE LENGTH: shortened hip flexor lengths bil right > left; shortened right distal quads  POSTURE:  increased forward trunk lean  PALPATION: Tender points in proximal hip flexors  LOWER EXTREMITY ROM: decreased bil hip internal rotation right 0 degrees, left 5 degrees; decreased right external rotation 20 degrees  SPINAL ROM; Flexion WFLs, extension 75% limited, side bending 25% limited  LOWER EXTREMITY MMT:  MMT Right eval Left eval  Hip flexion 4 4  Hip extension 4 4  Hip abduction 4- 4  Hip adduction    Hip internal rotation  Hip external rotation 4- 4  Knee flexion    Knee extension 4 4  Ankle dorsiflexion    Ankle plantarflexion    Ankle inversion    Ankle eversion     (Blank rows = not tested)  LOWER EXTREMITY SPECIAL TESTS:   SLS LEFT 8 sec, RIGHT 15 sec  FUNCTIONAL TESTS:  Able to rise sit to stand without UE assist Able to squat to pick up small object from the floor  GAIT:  Comments: decreased hip extension, forward trunk lean                                                                                                                                TREATMENT DATE:  12/30/24: Brought PAD screening test from home Medicare visit: right .77 moderate, left .57 significant plans to discuss with cardiologist in March Nu-Step seat 11, arms 11 L 3 while discussing status and response to treatment Trigger Point Dry Needling Initial Treatment: Pt instructed on Dry Needling rational, procedures, and possible side effects. Pt instructed to expect mild to moderate muscle soreness later in the day and/or into the next day.  Pt instructed in methods to reduce muscle soreness. Pt instructed to continue prescribed HEP. Patient  verbalized understanding of these instructions and education.  Patient Verbal Consent Given: Yes Education Handout Provided: Yes Muscles Treated: right sartorius, rectus femoris Electrical Stimulation Performed: Yes, Parameters: 1.0 ma very light  Treatment Response/Outcome: pt is on blood thinners, aware of potential bruising Manual right and left hip joint mobs: supine: grade 3/4 long leg axis distraction; prone: PA mobs, PA in internal and external rotation 5x 20-30 sec each; prone knee bends 20x right and left Staggered stance sit to stand 10x Wall sits 10 sec hold 3x 12/25/24: Review of status and response to treatment  Manual right and left hip joint mobs: supine: grade 3/4 long leg axis distraction, inferior mobs with belt, PA in internal rotation 5x 20-30 sec each; prone: PA mobs, PA in internal and external rotation 5x 20-30 sec each; prone knee bends 20x right and left Standing hip mobility 1/2 circles over hurdle each way right/left 10x Standing circles around tall cone 10x right/left Pt has upcoming trip to [place]-discussed his stretching routine  Review of HEP   12/23/24: Review of status and response to HEP Manual right hip joint mobs: supine: grade 3/4 long leg axis distraction, inferior mobs with belt, PA in internal rotation 5x 20-30 sec each; prone: PA mobs, PA in internal and external rotation 5x 20-30 sec each; prone knee bends 20x right Prone quad stretch with strap right/left (left tighter) 10x Supine HS stretch with strap 30 sec holds 3x right/left Quadruped hip mobility exs: rocking with crossed ankles for external rotation and in internal rotation (Added to HEP- see below) Squats in front of the chair 10x (Added to HEP- see below) Pt also plans to do the steps several times a day for strengthening  12/08/24 evaluation  Initial HEP (hip flexor and quad lengthening) Discussed DN (pt had a good response in the past)   PATIENT EDUCATION:  Education details:  Educated patient on anatomy and physiology of current symptoms, prognosis, plan of care as well as initial self care strategies to promote recovery Person educated: Patient Education method: Explanation Education comprehension: verbalized understanding  HOME EXERCISE PROGRAM: Access Code: [email or web address] URL: [email or web address] Date: 12/23/2024 Prepared by: Glade Pesa  Exercises - Hip Flexor Stretch at Edge of Bed  - 1 x daily - 7 x weekly - 1 sets - 2-3 reps - 30 hold - Prone Quadriceps Stretch with Strap  - 1 x daily - 7 x weekly - 1 sets - 3 reps - 30 hold - Half Kneeling Hip Flexor Stretch  - 1 x daily - 7 x weekly - 1 sets - 3 reps - 30 hold - Supine Hamstring Stretch with Strap  - 1 x daily - 7 x weekly - 1 sets - 3 reps - 30 hold - Quadruped Rocking Slow  - 1 x daily - 7 x weekly - 1 sets - 10 reps - Squat with Chair Touch  - 2 x daily - 7 x weekly - 1 sets - 10 reps  ASSESSMENT:  CLINICAL IMPRESSION: The patient had a positive initial response to DN with much improved soft tissue mobility and decreased size and number of tender points.  Therapist monitoring response and educating patient on what to expect following DN and how to optimize benefit with specific exercise.  Anticipate pain intensity and mobility will continue to improve over the next few days.     Eval Patient is an 84 y.o. male who was seen today for physical therapy evaluation and treatment for right thigh pain for the past 5-6 months. Symptoms are worsened in the mornings, with walking 1/2 mile and feels weaker with ascending steps at home and church (tends to avoid them). His symptoms are relieved with sitting 10 minutes.  Decreased hip flexor and distal quad muscle lengths noted and decreased hip internal rotation ROM.  Weakness with hip abduction and external rotation strength.  He would benefit from skilled PT to address these deficits and limitations.   OBJECTIVE IMPAIRMENTS: decreased activity  tolerance, decreased mobility, difficulty walking, decreased ROM, decreased strength, increased fascial restrictions, impaired perceived functional ability, and pain.   ACTIVITY LIMITATIONS: stairs and locomotion level  PARTICIPATION LIMITATIONS: driving, shopping, community activity, and golf  PERSONAL FACTORS: Time since onset of injury/illness/exacerbation are also affecting patient's functional outcome.   REHAB POTENTIAL: Good  CLINICAL DECISION MAKING: Stable/uncomplicated  EVALUATION COMPLEXITY: Low   GOALS: Goals reviewed with patient? Yes  SHORT TERM GOALS: Target date: 01/05/2025   The patient will demonstrate knowledge of basic self care strategies and exercises to promote healing  Baseline: Goal status: INITIAL  2.  The patient will report a 30% improvement in pain levels with functional activities which are currently difficult including mornings and with walking 1/2 mile Baseline:  Goal status: INITIAL  3.  Improve hip extension to 8 degrees needed for longer stride lengths with walking community distances Baseline:  Goal status: INITIAL  4.  The patient will have improved hip strength to at least 4/5 needed for standing, walking longer distances and ascending/descending stairs at home and in the community Baseline:  Goal status: INITIAL   LONG TERM GOALS: Target date: 02/02/2025    The patient will be independent in a safe self progression of a home exercise  program to promote further recovery of function  Baseline:  Goal status: INITIAL  2.  The patient will report a 60% improvement in pain levels with functional activities which are currently difficult including mornings and with walking 1/2 mile Baseline:  Goal status: INITIAL  3.   Improve hip extension to 10 degrees needed for longer stride lengths with walking community distances Baseline:  Goal status: INITIAL  4.  The patient will have improved hip strength to at least 4+/5 needed for standing,  walking longer distances and ascending/descending stairs at home and in the community  Baseline:  Goal status: INITIAL  5.  LEFS improved to 69/70 indicating improved function with less pain Baseline:  Goal status: INITIAL    PLAN:  PT FREQUENCY: 2x/week  PT DURATION: 8 weeks  PLANNED INTERVENTIONS: 97164- PT Re-evaluation, 97750- Physical Performance Testing, 97110-Therapeutic exercises, 97530- Therapeutic activity, 97112- Neuromuscular re-education, 97535- Self Care, [id number]- Manual therapy, [phone]- Aquatic Therapy, [id number]- Electrical stimulation (unattended), [phone]- Electrical stimulation (manual), [id number]- Ultrasound, [id number]- Traction (mechanical), [id number]- Ionotophoresis 4mg /ml Dexamethasone , 79439 (1-2 muscles), [id number] (3+ muscles)- Dry Needling, Patient/Family education, Stair training, Taping, Joint mobilization, Spinal mobilization, Cryotherapy, and Moist heat  PLAN FOR NEXT SESSION: assess response to DN plus ES;hip mobilizations;  hip flexor and quad lengthening;  glute medius strengthening; DN as needed to hip flexors/quads  Glade Pesa, PT 12/30/24 10:08 AM Phone: [phone] Fax: [phone]     "

## 2025-01-01 ENCOUNTER — Ambulatory Visit: Admitting: Physical Therapy

## 2025-01-01 DIAGNOSIS — M79651 Pain in right thigh: Secondary | ICD-10-CM | POA: Diagnosis not present

## 2025-01-01 DIAGNOSIS — M6281 Muscle weakness (generalized): Secondary | ICD-10-CM

## 2025-01-01 DIAGNOSIS — R262 Difficulty in walking, not elsewhere classified: Secondary | ICD-10-CM

## 2025-01-01 NOTE — Therapy (Signed)
 " OUTPATIENT PHYSICAL THERAPY LOWER EXTREMITY PROGRESS NOTE   Patient Name: Miguel Walker MRN: [id number] DOB:January 13, 1941, 84 y.o., male Today's Date: 01/01/2025  END OF SESSION:  PT End of Session - 01/01/25 0936     Visit Number 5    Date for Recertification  02/02/25    Authorization Type [employer] no auth needed DN paper signed for Jan    Progress Note Due on Visit 10    PT Start Time 0933    PT Stop Time 1015    PT Time Calculation (min) 42 min    Activity Tolerance Patient tolerated treatment well          Past Medical History:  Diagnosis Date   Aneurysm, ascending aorta    Cancer (HCC) 1997   Prostate cancer   Coronary artery disease    Dyslipidemia    Family history of premature CAD    NO ISCHEMIC SYMPTOMS   Hyperlipidemia    Hypertension    Thyroid  nodule    Past Surgical History:  Procedure Laterality Date   CARDIAC CATHETERIZATION     CARPAL TUNNEL RELEASE     CORONARY/GRAFT ACUTE MI REVASCULARIZATION N/A 02/05/2022   Procedure: Coronary/Graft Acute MI Revascularization;  Surgeon: Anner Alm ORN, MD;  Location: [facility] INVASIVE CV [facility];  Service: Cardiovascular;  Laterality: N/A;   HAND SURGERY     INGUINAL HERNIA REPAIR Right 1982   in [place] - open w mesh   LEFT HEART CATH AND CORONARY ANGIOGRAPHY N/A 02/05/2022   Procedure: LEFT HEART CATH AND CORONARY ANGIOGRAPHY;  Surgeon: Anner Alm ORN, MD;  Location: [facility] INVASIVE CV LAB;  Service: Cardiovascular;  Laterality: N/A;   ROTATOR CUFF REPAIR     URINARY SPHINCTER IMPLANT  2019   Dr Prentice Endo, Madie Mouton   Patient Active Problem List   Diagnosis Date Noted   Pericarditis 08/12/2022   Atypical chest pain 05/31/2022   History of CAD (coronary artery disease) 05/31/2022   Chronic combined systolic and diastolic heart failure (HCC) 05/31/2022   Bradycardia 04/08/2022   Aortic root dilatation 04/08/2022   Essential hypertension 04/08/2022   Ischemic cardiomyopathy 02/08/2022   Coronary artery  thrombosis with myocardial infarction [facility]) 02/06/2022   Coronary artery disease involving native coronary artery of native heart with unstable angina pectoris (HCC) 02/06/2022   Heart block AV complete (HCC) 02/06/2022   Presence of drug coated stent in right coronary artery 02/06/2022   Acute ST elevation myocardial infarction (STEMI) of inferolateral wall (HCC) 02/05/2022   Nausea in adult 02/05/2022   Educated about COVID-19 virus infection 09/06/2020   Hyperlipidemia LDL goal <70 09/06/2020   Elevated coronary artery calcium  score 09/06/2020   Aortic root enlargement 09/06/2020   Nonspecific abnormal electrocardiogram (ECG) (EKG) 08/28/2019   Thoracic aortic aneurysm without rupture 08/28/2019    PCP: Kip Righter MD  REFERRING PROVIDER: Kip Righter MD  REFERRING DIAG: [id number] thigh pain  THERAPY DIAG:  Pain in right thigh  Muscle weakness (generalized)  Difficulty in walking, not elsewhere classified  Rationale for Evaluation and Treatment: Rehabilitation  ONSET DATE: 6 months ago  SUBJECTIVE:   SUBJECTIVE STATEMENT: Feeling tightness in HS on right more than the quads. Driving to [place] next week.  EVAL: 5-6 months started for no known injury; increased in pain,  present in the AM and with walks with my neighbor; onset with 1/2 mile walking;  sit 10 minutes and then numb pain goes away can do longer walking after sitting 3 to 3 1/2  miles;  when I play golf feel it but then recovers with sitting in the cart.  Happening more frequently; I drive to [place] to see family and when I came home I feel stiffness in my back but otherwise no back pain;  Urologist checked for hernia and implant.   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 Yes NPRS scale: 0/10 Pain location: right HS Pain orientation: right numb pain  PAIN TYPE: aching Pain description: intermittent  Aggravating factors: walking; first thing in the AM; golf, leading with steps feels sluggish;church steps feels sluggish too Relieving factors: sitting   PRECAUTIONS: None   WEIGHT BEARING RESTRICTIONS: No  FALLS:  Has patient fallen in last 6 months? No  LIVING ENVIRONMENT:  Lives in: House/apartment Stairs: avoiding walking upstairs (bedroom downstairs)    PLOF: Independent  PATIENT GOALS: minimize this pain; build up this muscle over time  OBJECTIVE:  Note: Objective measures were completed at Evaluation unless otherwise noted.  DIAGNOSTIC FINDINGS: none  PATIENT SURVEYS:  LEFS  Extreme difficulty/unable (0), Quite a bit of difficulty (1), Moderate difficulty (2), Little difficulty (3), No difficulty (4) Survey date:   1/6  Any of your usual work, housework or school activities 4  2. Usual hobbies, recreational or sporting activities 2  3. Getting into/out of the bath 4  4. Walking between rooms 4  5. Putting on socks/shoes 4  6. Squatting  3  7. Lifting an object, like a bag of groceries from the floor 4  8. Performing light activities around your home 4  9. Performing heavy activities around your home 3  10. Getting into/out of a car 3  11. Walking 2 blocks 4  12. Walking 1 mile 3  13. Going up/down 10 stairs (1 flight) 3  14. Standing for 1 hour 3  15.  sitting for 1 hour 4  16. Running on even ground 2  17. Running on uneven ground 2  18. Making sharp turns while running fast 2  19. Hopping  2  20. Rolling over in bed 4  Score total:  64/80      COGNITION: Overall cognitive status: Within functional limits for  tasks assessed     MUSCLE LENGTH: shortened hip flexor lengths bil right > left; shortened right distal quads  POSTURE:  increased forward trunk lean  PALPATION: Tender points in proximal hip flexors  LOWER EXTREMITY ROM: decreased bil hip internal rotation right 0 degrees, left 5 degrees; decreased right external rotation 20 degrees  SPINAL ROM; Flexion WFLs, extension 75% limited, side bending 25% limited  LOWER EXTREMITY MMT:  MMT Right eval Left eval  Hip flexion 4 4  Hip extension 4 4  Hip abduction 4- 4  Hip adduction    Hip internal rotation    Hip external rotation 4- 4  Knee flexion    Knee extension 4 4  Ankle dorsiflexion    Ankle plantarflexion    Ankle inversion    Ankle eversion     (Blank rows =  not tested)  LOWER EXTREMITY SPECIAL TESTS:   SLS LEFT 8 sec, RIGHT 15 sec  FUNCTIONAL TESTS:  Able to rise sit to stand without UE assist Able to squat to pick up small object from the floor  GAIT:  Comments: decreased hip extension, forward trunk lean                                                                                                                                TREATMENT DATE:  01/01/25: Nu-Step seat 11, arms 11 L 3 5 min while discussing status and response to treatment Manual right and left hip joint mobs: supine: grade 3/4 long leg axis distraction, inferior mobs with belt, PA in internal rotation 5x 20-30 sec each; prone: PA mobs, PA in internal and external rotation 5x 20-30 sec each; prone knee bends 20x right and left At the stairs 2nd step hip flexor, gastroc and quadratus lumborum muscle lengthening 3 sets of 5 right/left: Rocking forward and back Rocking forward and back with arm elevation Rocking forward and back with arm reach up and over At the stairs 2nd step hamstring stretch 5x right/left 6 inch step ups 10x right/left WB on single leg with hip 3 ways 10x with minimal UE support  Discussion of plan for ex while  traveling      12/30/24: Brought PAD screening test from home Medicare visit: right .77 moderate, left .57 significant plans to discuss with cardiologist in March Nu-Step seat 11, arms 11 L 3 while discussing status and response to treatment Trigger Point Dry Needling Initial Treatment: Pt instructed on Dry Needling rational, procedures, and possible side effects. Pt instructed to expect mild to moderate muscle soreness later in the day and/or into the next day.  Pt instructed in methods to reduce muscle soreness. Pt instructed to continue prescribed HEP. Patient verbalized understanding of these instructions and education.  Patient Verbal Consent Given: Yes Education Handout Provided: Yes Muscles Treated: right sartorius, rectus femoris Electrical Stimulation Performed: Yes, Parameters: 1.0 ma very light  Treatment Response/Outcome: pt is on blood thinners, aware of potential bruising Manual right and left hip joint mobs: supine: grade 3/4 long leg axis distraction; prone: PA mobs, PA in internal and external rotation 5x 20-30 sec each; prone knee bends 20x right and left Staggered stance sit to stand 10x Wall sits 10 sec hold 3x 12/25/24: Review of status and response to treatment  Manual right and left hip joint mobs: supine: grade 3/4 long leg axis distraction, inferior mobs with belt, PA in internal rotation 5x 20-30 sec each; prone: PA mobs, PA in internal and external rotation 5x 20-30 sec each; prone knee bends 20x right and left Standing hip mobility 1/2 circles over hurdle each way right/left 10x Standing circles around tall cone 10x right/left Pt has upcoming trip to [place]-discussed his stretching routine  Review of HEP   12/23/24: Review of status and response to HEP Manual right hip  joint mobs: supine: grade 3/4 long leg axis distraction, inferior mobs with belt, PA in internal rotation 5x 20-30 sec each; prone: PA mobs, PA in internal and external rotation 5x 20-30 sec  each; prone knee bends 20x right Prone quad stretch with strap right/left (left tighter) 10x Supine HS stretch with strap 30 sec holds 3x right/left Quadruped hip mobility exs: rocking with crossed ankles for external rotation and in internal rotation (Added to HEP- see below) Squats in front of the chair 10x (Added to HEP- see below) Pt also plans to do the steps several times a day for strengthening     PATIENT EDUCATION:  Education details: Educated patient on anatomy and physiology of current symptoms, prognosis, plan of care as well as initial self care strategies to promote recovery Person educated: Patient Education method: Explanation Education comprehension: verbalized understanding  HOME EXERCISE PROGRAM: Access Code: [email or web address] URL: [email or web address] Date: 12/23/2024 Prepared by: Glade Pesa  Exercises - Hip Flexor Stretch at [facility] of Bed  - 1 x daily - 7 x weekly - 1 sets - 2-3 reps - 30 hold - Prone Quadriceps Stretch with Strap  - 1 x daily - 7 x weekly - 1 sets - 3 reps - 30 hold - Half Kneeling Hip Flexor Stretch  - 1 x daily - 7 x weekly - 1 sets - 3 reps - 30 hold - Supine Hamstring Stretch with Strap  - 1 x daily - 7 x weekly - 1 sets - 3 reps - 30 hold - Quadruped Rocking Slow  - 1 x daily - 7 x weekly - 1 sets - 10 reps - Squat with Chair Touch  - 2 x daily - 7 x weekly - 1 sets - 10 reps  ASSESSMENT:  CLINICAL IMPRESSION: Much improved hip mobility and muscle length since start of care.  Able to progress challenge level of ex's including added strengthening ex's of the Les without exacerbation of pain. Therapist providing verbal cues for ex technique and to monitor response.   Eval Patient is an 84 y.o. male who was seen today for physical therapy evaluation and treatment for right thigh pain for the past 5-6 months. Symptoms are worsened in the mornings, with walking 1/2 mile and feels weaker with ascending steps at home and church (tends to  avoid them). His symptoms are relieved with sitting 10 minutes.  Decreased hip flexor and distal quad muscle lengths noted and decreased hip internal rotation ROM.  Weakness with hip abduction and external rotation strength.  He would benefit from skilled PT to address these deficits and limitations.   OBJECTIVE IMPAIRMENTS: decreased activity tolerance, decreased mobility, difficulty walking, decreased ROM, decreased strength, increased fascial restrictions, impaired perceived functional ability, and pain.   ACTIVITY LIMITATIONS: stairs and locomotion level  PARTICIPATION LIMITATIONS: driving, shopping, community activity, and golf  PERSONAL FACTORS: Time since onset of injury/illness/exacerbation are also affecting patient's functional outcome.   REHAB POTENTIAL: Good  CLINICAL DECISION MAKING: Stable/uncomplicated  EVALUATION COMPLEXITY: Low   GOALS: Goals reviewed with patient? Yes  SHORT TERM GOALS: Target date: 01/05/2025   The patient will demonstrate knowledge of basic self care strategies and exercises to promote healing  Baseline: Goal status: INITIAL  2.  The patient will report a 30% improvement in pain levels with functional activities which are currently difficult including mornings and with walking 1/2 mile Baseline:  Goal status: INITIAL  3.  Improve hip extension to 8 degrees needed for longer stride lengths with walking community  distances Baseline:  Goal status: INITIAL  4.  The patient will have improved hip strength to at least 4/5 needed for standing, walking longer distances and ascending/descending stairs at home and in the community Baseline:  Goal status: INITIAL   LONG TERM GOALS: Target date: 02/02/2025    The patient will be independent in a safe self progression of a home exercise program to promote further recovery of function  Baseline:  Goal status: INITIAL  2.  The patient will report a 60% improvement in pain levels with functional  activities which are currently difficult including mornings and with walking 1/2 mile Baseline:  Goal status: INITIAL  3.   Improve hip extension to 10 degrees needed for longer stride lengths with walking community distances Baseline:  Goal status: INITIAL  4.  The patient will have improved hip strength to at least 4+/5 needed for standing, walking longer distances and ascending/descending stairs at home and in the community  Baseline:  Goal status: INITIAL  5.  LEFS improved to 69/70 indicating improved function with less pain Baseline:  Goal status: INITIAL    PLAN:  PT FREQUENCY: 2x/week  PT DURATION: 8 weeks  PLANNED INTERVENTIONS: 97164- PT Re-evaluation, 97750- Physical Performance Testing, 97110-Therapeutic exercises, 97530- Therapeutic activity, 97112- Neuromuscular re-education, 97535- Self Care, [id number]- Manual therapy, [phone]- Aquatic Therapy, [id number]- Electrical stimulation (unattended), [phone]- Electrical stimulation (manual), [id number]- Ultrasound, [id number]- Traction (mechanical), [id number]- Ionotophoresis 4mg /ml Dexamethasone , 79439 (1-2 muscles), 20561 (3+ muscles)- Dry Needling, Patient/Family education, Stair training, Taping, Joint mobilization, Spinal mobilization, Cryotherapy, and Moist heat  PLAN FOR NEXT SESSION: follow up after his trip to [place];  check STGs; DN plus ES;hip mobilizations;  hip flexor and quad lengthening;  glute medius strengthening; DN as needed to hip flexors/quads  Glade Pesa, PT 01/01/25 12:07 PM Phone: [phone] Fax: [phone]     "

## 2025-01-13 ENCOUNTER — Ambulatory Visit: Admitting: Physical Therapy

## 2025-01-15 ENCOUNTER — Ambulatory Visit: Admitting: Physical Therapy

## 2025-01-20 ENCOUNTER — Ambulatory Visit: Admitting: Physical Therapy

## 2025-01-22 ENCOUNTER — Ambulatory Visit: Admitting: Physical Therapy

## 2025-01-27 ENCOUNTER — Ambulatory Visit: Admitting: Physical Therapy

## 2025-01-29 ENCOUNTER — Ambulatory Visit: Admitting: Physical Therapy

## 2025-02-02 ENCOUNTER — Ambulatory Visit: Admitting: Physical Therapy

## 2025-02-18 ENCOUNTER — Ambulatory Visit: Admitting: Cardiology
